# Patient Record
Sex: Male | Born: 1960 | Race: Black or African American | Hispanic: No | Marital: Single | Smoking: Current every day smoker
Health system: Southern US, Community
[De-identification: ages and names within clinical notes are randomized; demographics above are authoritative.]

## PROBLEM LIST (undated history)

## (undated) DIAGNOSIS — G8929 Other chronic pain: Secondary | ICD-10-CM

## (undated) HISTORY — PX: ORTHOPEDIC SURGERY: SHX850

## (undated) HISTORY — PX: ANKLE FRACTURE SURGERY: SHX122

---

## 1997-12-13 ENCOUNTER — Emergency Department (HOSPITAL_COMMUNITY): Admission: EM | Admit: 1997-12-13 | Discharge: 1997-12-13 | Payer: Self-pay | Admitting: Emergency Medicine

## 1998-04-29 ENCOUNTER — Emergency Department (HOSPITAL_COMMUNITY): Admission: EM | Admit: 1998-04-29 | Discharge: 1998-04-29 | Payer: Self-pay | Admitting: *Deleted

## 1998-06-27 ENCOUNTER — Emergency Department (HOSPITAL_COMMUNITY): Admission: EM | Admit: 1998-06-27 | Discharge: 1998-06-27 | Payer: Self-pay | Admitting: Emergency Medicine

## 1998-12-20 ENCOUNTER — Encounter: Payer: Self-pay | Admitting: Emergency Medicine

## 1998-12-20 ENCOUNTER — Emergency Department (HOSPITAL_COMMUNITY): Admission: EM | Admit: 1998-12-20 | Discharge: 1998-12-20 | Payer: Self-pay | Admitting: Emergency Medicine

## 1999-06-19 ENCOUNTER — Emergency Department (HOSPITAL_COMMUNITY): Admission: EM | Admit: 1999-06-19 | Discharge: 1999-06-19 | Payer: Self-pay | Admitting: Emergency Medicine

## 1999-09-02 ENCOUNTER — Encounter: Payer: Self-pay | Admitting: Emergency Medicine

## 1999-09-02 ENCOUNTER — Emergency Department (HOSPITAL_COMMUNITY): Admission: EM | Admit: 1999-09-02 | Discharge: 1999-09-02 | Payer: Self-pay | Admitting: Emergency Medicine

## 1999-09-14 ENCOUNTER — Emergency Department (HOSPITAL_COMMUNITY): Admission: EM | Admit: 1999-09-14 | Discharge: 1999-09-14 | Payer: Self-pay | Admitting: Emergency Medicine

## 2000-09-05 ENCOUNTER — Emergency Department (HOSPITAL_COMMUNITY): Admission: EM | Admit: 2000-09-05 | Discharge: 2000-09-05 | Payer: Self-pay | Admitting: Emergency Medicine

## 2000-09-05 ENCOUNTER — Encounter: Payer: Self-pay | Admitting: Emergency Medicine

## 2000-10-05 ENCOUNTER — Encounter: Payer: Self-pay | Admitting: Emergency Medicine

## 2000-10-05 ENCOUNTER — Inpatient Hospital Stay (HOSPITAL_COMMUNITY): Admission: EM | Admit: 2000-10-05 | Discharge: 2000-10-13 | Payer: Self-pay | Admitting: *Deleted

## 2000-10-06 ENCOUNTER — Encounter: Payer: Self-pay | Admitting: Hematology & Oncology

## 2000-10-09 ENCOUNTER — Encounter: Payer: Self-pay | Admitting: Family Medicine

## 2000-10-18 ENCOUNTER — Emergency Department (HOSPITAL_COMMUNITY): Admission: EM | Admit: 2000-10-18 | Discharge: 2000-10-18 | Payer: Self-pay | Admitting: Emergency Medicine

## 2001-02-06 ENCOUNTER — Emergency Department (HOSPITAL_COMMUNITY): Admission: EM | Admit: 2001-02-06 | Discharge: 2001-02-06 | Payer: Self-pay | Admitting: *Deleted

## 2001-04-10 ENCOUNTER — Emergency Department (HOSPITAL_COMMUNITY): Admission: EM | Admit: 2001-04-10 | Discharge: 2001-04-10 | Payer: Self-pay | Admitting: *Deleted

## 2001-05-01 ENCOUNTER — Encounter: Payer: Self-pay | Admitting: Emergency Medicine

## 2001-05-01 ENCOUNTER — Emergency Department (HOSPITAL_COMMUNITY): Admission: EM | Admit: 2001-05-01 | Discharge: 2001-05-01 | Payer: Self-pay

## 2001-10-22 ENCOUNTER — Emergency Department (HOSPITAL_COMMUNITY): Admission: EM | Admit: 2001-10-22 | Discharge: 2001-10-22 | Payer: Self-pay | Admitting: Emergency Medicine

## 2003-05-24 ENCOUNTER — Emergency Department (HOSPITAL_COMMUNITY): Admission: EM | Admit: 2003-05-24 | Discharge: 2003-05-24 | Payer: Self-pay | Admitting: Emergency Medicine

## 2003-06-06 ENCOUNTER — Emergency Department (HOSPITAL_COMMUNITY): Admission: AD | Admit: 2003-06-06 | Discharge: 2003-06-06 | Payer: Self-pay | Admitting: Family Medicine

## 2003-09-12 ENCOUNTER — Emergency Department (HOSPITAL_COMMUNITY): Admission: EM | Admit: 2003-09-12 | Discharge: 2003-09-12 | Payer: Self-pay | Admitting: Emergency Medicine

## 2005-01-08 ENCOUNTER — Emergency Department (HOSPITAL_COMMUNITY): Admission: EM | Admit: 2005-01-08 | Discharge: 2005-01-08 | Payer: Self-pay | Admitting: Family Medicine

## 2005-01-16 ENCOUNTER — Emergency Department (HOSPITAL_COMMUNITY): Admission: EM | Admit: 2005-01-16 | Discharge: 2005-01-16 | Payer: Self-pay | Admitting: Family Medicine

## 2005-03-23 ENCOUNTER — Emergency Department (HOSPITAL_COMMUNITY): Admission: EM | Admit: 2005-03-23 | Discharge: 2005-03-23 | Payer: Self-pay | Admitting: Family Medicine

## 2005-03-29 ENCOUNTER — Emergency Department (HOSPITAL_COMMUNITY): Admission: EM | Admit: 2005-03-29 | Discharge: 2005-03-29 | Payer: Self-pay | Admitting: Emergency Medicine

## 2005-12-04 ENCOUNTER — Emergency Department (HOSPITAL_COMMUNITY): Admission: EM | Admit: 2005-12-04 | Discharge: 2005-12-04 | Payer: Self-pay | Admitting: Family Medicine

## 2005-12-07 ENCOUNTER — Emergency Department (HOSPITAL_COMMUNITY): Admission: EM | Admit: 2005-12-07 | Discharge: 2005-12-08 | Payer: Self-pay | Admitting: Emergency Medicine

## 2006-02-11 ENCOUNTER — Emergency Department (HOSPITAL_COMMUNITY): Admission: EM | Admit: 2006-02-11 | Discharge: 2006-02-11 | Payer: Self-pay | Admitting: Emergency Medicine

## 2006-08-08 ENCOUNTER — Emergency Department (HOSPITAL_COMMUNITY): Admission: EM | Admit: 2006-08-08 | Discharge: 2006-08-08 | Payer: Self-pay | Admitting: Family Medicine

## 2006-08-22 ENCOUNTER — Emergency Department (HOSPITAL_COMMUNITY): Admission: EM | Admit: 2006-08-22 | Discharge: 2006-08-22 | Payer: Self-pay | Admitting: Family Medicine

## 2007-02-18 ENCOUNTER — Emergency Department (HOSPITAL_COMMUNITY): Admission: EM | Admit: 2007-02-18 | Discharge: 2007-02-18 | Payer: Self-pay | Admitting: Emergency Medicine

## 2007-03-03 ENCOUNTER — Emergency Department (HOSPITAL_COMMUNITY): Admission: EM | Admit: 2007-03-03 | Discharge: 2007-03-03 | Payer: Self-pay | Admitting: Emergency Medicine

## 2007-05-26 ENCOUNTER — Emergency Department (HOSPITAL_COMMUNITY): Admission: EM | Admit: 2007-05-26 | Discharge: 2007-05-26 | Payer: Self-pay | Admitting: Emergency Medicine

## 2007-07-20 ENCOUNTER — Emergency Department (HOSPITAL_COMMUNITY): Admission: EM | Admit: 2007-07-20 | Discharge: 2007-07-20 | Payer: Self-pay | Admitting: Emergency Medicine

## 2007-09-01 ENCOUNTER — Emergency Department (HOSPITAL_COMMUNITY): Admission: EM | Admit: 2007-09-01 | Discharge: 2007-09-01 | Payer: Self-pay | Admitting: Emergency Medicine

## 2007-11-23 ENCOUNTER — Emergency Department (HOSPITAL_COMMUNITY): Admission: EM | Admit: 2007-11-23 | Discharge: 2007-11-23 | Payer: Self-pay | Admitting: Emergency Medicine

## 2008-01-03 ENCOUNTER — Emergency Department (HOSPITAL_COMMUNITY): Admission: EM | Admit: 2008-01-03 | Discharge: 2008-01-03 | Payer: Self-pay | Admitting: Emergency Medicine

## 2008-03-24 ENCOUNTER — Emergency Department (HOSPITAL_COMMUNITY): Admission: EM | Admit: 2008-03-24 | Discharge: 2008-03-24 | Payer: Self-pay | Admitting: Emergency Medicine

## 2008-04-15 ENCOUNTER — Emergency Department (HOSPITAL_COMMUNITY): Admission: EM | Admit: 2008-04-15 | Discharge: 2008-04-15 | Payer: Self-pay | Admitting: Emergency Medicine

## 2008-04-20 ENCOUNTER — Emergency Department (HOSPITAL_COMMUNITY): Admission: EM | Admit: 2008-04-20 | Discharge: 2008-04-20 | Payer: Self-pay | Admitting: Emergency Medicine

## 2008-09-28 ENCOUNTER — Emergency Department (HOSPITAL_COMMUNITY): Admission: EM | Admit: 2008-09-28 | Discharge: 2008-09-28 | Payer: Self-pay | Admitting: Emergency Medicine

## 2009-05-28 ENCOUNTER — Emergency Department (HOSPITAL_COMMUNITY): Admission: EM | Admit: 2009-05-28 | Discharge: 2009-05-28 | Payer: Self-pay | Admitting: Emergency Medicine

## 2009-07-06 ENCOUNTER — Encounter: Admission: RE | Admit: 2009-07-06 | Discharge: 2009-07-06 | Payer: Self-pay | Admitting: Internal Medicine

## 2010-09-20 LAB — DIFFERENTIAL
Basophils Absolute: 0.1 10*3/uL (ref 0.0–0.1)
Basophils Relative: 2 % — ABNORMAL HIGH (ref 0–1)
Eosinophils Absolute: 0.1 10*3/uL (ref 0.0–0.7)
Eosinophils Relative: 3 % (ref 0–5)
Lymphocytes Relative: 40 % (ref 12–46)
Lymphs Abs: 1.7 10*3/uL (ref 0.7–4.0)
Monocytes Absolute: 0.4 10*3/uL (ref 0.1–1.0)
Monocytes Relative: 9 % (ref 3–12)
Neutro Abs: 1.9 10*3/uL (ref 1.7–7.7)
Neutrophils Relative %: 46 % (ref 43–77)

## 2010-09-20 LAB — CBC
HCT: 42.2 % (ref 39.0–52.0)
Hemoglobin: 14.6 g/dL (ref 13.0–17.0)
MCHC: 34.5 g/dL (ref 30.0–36.0)
MCV: 91.4 fL (ref 78.0–100.0)
Platelets: 197 10*3/uL (ref 150–400)
RBC: 4.61 MIL/uL (ref 4.22–5.81)
RDW: 12.9 % (ref 11.5–15.5)
WBC: 4.2 10*3/uL (ref 4.0–10.5)

## 2010-09-20 LAB — COMPREHENSIVE METABOLIC PANEL
ALT: 12 U/L (ref 0–53)
AST: 11 U/L (ref 0–37)
Albumin: 3.6 g/dL (ref 3.5–5.2)
Alkaline Phosphatase: 53 U/L (ref 39–117)
BUN: 11 mg/dL (ref 6–23)
CO2: 23 mEq/L (ref 19–32)
Calcium: 9 mg/dL (ref 8.4–10.5)
Chloride: 106 mEq/L (ref 96–112)
Creatinine, Ser: 1.09 mg/dL (ref 0.4–1.5)
GFR calc Af Amer: >60 mL/min (ref >60)
GFR calc non Af Amer: >60 mL/min (ref >60)
Glucose, Bld: 84 mg/dL (ref 70–99)
Potassium: 4.1 mEq/L (ref 3.5–5.1)
Sodium: 135 mEq/L (ref 135–145)
Total Bilirubin: 1 mg/dL (ref 0.3–1.2)
Total Protein: 6.8 g/dL (ref 6.0–8.3)

## 2010-09-20 LAB — URINALYSIS, ROUTINE W REFLEX MICROSCOPIC
Bilirubin Urine: NEGATIVE
Glucose, UA: NEGATIVE mg/dL
Hgb urine dipstick: NEGATIVE
Ketones, ur: NEGATIVE mg/dL
Nitrite: NEGATIVE
Protein, ur: NEGATIVE mg/dL
Specific Gravity, Urine: 1.019 (ref 1.005–1.030)
Urobilinogen, UA: 1 mg/dL (ref 0.0–1.0)
pH: 7 (ref 5.0–8.0)

## 2010-09-20 LAB — LIPASE, BLOOD: Lipase: 27 U/L (ref 11–59)

## 2010-09-28 LAB — CBC
HCT: 40.5 % (ref 39.0–52.0)
Hemoglobin: 14.3 g/dL (ref 13.0–17.0)
MCHC: 35.3 g/dL (ref 30.0–36.0)
MCV: 89.2 fL (ref 78.0–100.0)
Platelets: 165 10*3/uL (ref 150–400)
RBC: 4.54 MIL/uL (ref 4.22–5.81)
RDW: 13.8 % (ref 11.5–15.5)
WBC: 3.5 10*3/uL — ABNORMAL LOW (ref 4.0–10.5)

## 2010-09-28 LAB — COMPREHENSIVE METABOLIC PANEL
ALT: 14 U/L (ref 0–53)
AST: 18 U/L (ref 0–37)
Albumin: 3.6 g/dL (ref 3.5–5.2)
Alkaline Phosphatase: 55 U/L (ref 39–117)
BUN: 10 mg/dL (ref 6–23)
CO2: 26 mEq/L (ref 19–32)
Calcium: 9.1 mg/dL (ref 8.4–10.5)
Chloride: 107 mEq/L (ref 96–112)
Creatinine, Ser: 1.07 mg/dL (ref 0.4–1.5)
GFR calc Af Amer: >60 mL/min (ref >60)
GFR calc non Af Amer: >60 mL/min (ref >60)
Glucose, Bld: 106 mg/dL — ABNORMAL HIGH (ref 70–99)
Potassium: 3.5 mEq/L (ref 3.5–5.1)
Sodium: 139 mEq/L (ref 135–145)
Total Bilirubin: 0.8 mg/dL (ref 0.3–1.2)
Total Protein: 6.2 g/dL (ref 6.0–8.3)

## 2010-09-28 LAB — URINALYSIS, ROUTINE W REFLEX MICROSCOPIC
Bilirubin Urine: NEGATIVE
Glucose, UA: NEGATIVE mg/dL
Hgb urine dipstick: NEGATIVE
Ketones, ur: NEGATIVE mg/dL
Nitrite: NEGATIVE
Protein, ur: NEGATIVE mg/dL
Specific Gravity, Urine: 1.017 (ref 1.005–1.030)
Urobilinogen, UA: 1 mg/dL (ref 0.0–1.0)
pH: 6.5 (ref 5.0–8.0)

## 2010-09-28 LAB — DIFFERENTIAL
Basophils Absolute: 0 10*3/uL (ref 0.0–0.1)
Basophils Relative: 1 % (ref 0–1)
Eosinophils Absolute: 0.1 10*3/uL (ref 0.0–0.7)
Eosinophils Relative: 3 % (ref 0–5)
Lymphocytes Relative: 41 % (ref 12–46)
Lymphs Abs: 1.4 10*3/uL (ref 0.7–4.0)
Monocytes Absolute: 0.4 10*3/uL (ref 0.1–1.0)
Monocytes Relative: 12 % (ref 3–12)
Neutro Abs: 1.5 10*3/uL — ABNORMAL LOW (ref 1.7–7.7)
Neutrophils Relative %: 44 % (ref 43–77)

## 2010-09-28 LAB — LIPASE, BLOOD: Lipase: 31 U/L (ref 11–59)

## 2010-11-04 NOTE — H&P (Signed)
. [facility]  Patient:    Steve Anderson, Steve Anderson                      MRN: [id number] Adm. 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  CHIEF COMPLAINT: "Shortness of breath and chest pain," which started yesterday.  HISTORY OF PRESENT ILLNESS: This is a 50 year old black male, with no chronic medical problems, who developed acute onset of dyspnea and sharp right-sided pleuritic chest pain about 6 p.m. on October 04, 2000 at rest.  His symptoms, especially the pleuritic pain, progressed today and he presented here for further evaluation.  He denies any recent cough, fever, chills, or any night sweats.  He had a CT scan here which showed bilateral lower lobe pulmonary emboli with small right lower lobe peripheral infarcts, but no obvious DVT. He has no prior history of DVT or PE.  There has been no recent acute injury. He smokes less than half a pack of cigarettes per day and denies any illicit drug use.  PAST MEDICAL HISTORY: Denies any chronic medical problems.  PAST SURGICAL HISTORY: In 1994 he had a compound fracture of the left lower extremity requiring surgery secondary to a fall.  He denies any other surgeries.  CURRENT MEDICATIONS: No regular medications.  ALLERGIES: No known drug allergies.  SOCIAL HISTORY: He is single.  He lives with a girlfriend and has three sons that live with him, two eight-year-old twins, and a ten-year-old son.  He works as a Education administrator.  He drinks about one to two beers per day and denies ever drinking more than two beers per day.  Smokes less than half a pack of cigarettes per day.  Denies any IV or other drug use.  FAMILY HISTORY: Mother has hypertension.  Father died at the age of 39 of lung cancer and was a smoker.  He  has five brothers and six sisters with no known health problems in them.  No coronary disease, diabetes, or stroke.  There is no known family history of any clotting disorder.  REVIEW OF SYSTEMS: Appetite has been good.  His weight has been stable.  He denies any recent problems with fever, chills, abdominal pain, lower extremity pain, stool changes, headaches, or syncope.  PHYSICAL EXAMINATION:  GENERAL: He is an alert and healthy-appearing gentleman, sitting up in bed, with oxygen by nasal cannula, in no apparent distress.  VITAL SIGNS: He is afebrile.  Blood pressure 117/69, pulse 100 and regular. Oxygen saturation 94% on room air, 97% on two liters nasal cannula.  HEENT: PERRL.  TMs normal.  Oropharynx moist.  No lesions.  NECK: Supple, without nodes.  No JVD.  CHEST: Faint rales at bases.  No retractions.  HEART: Regular rhythm, rate about 100; no murmur or rub.  ABDOMEN:  Soft, nontender, without hepatosplenomegaly.  RECTAL: Examination refused by patient.  EXTREMITIES: No lower extremity edema noted, 2+ dorsalis pedis pulses bilaterally.  No calf tenderness.  He has an ankle brace on the left ankle. There is no evidence of any ecchymosis or other signs of acute lower extremity trauma.  LABORATORY DATA: CT of the chest reveals bilateral lower lobe emboli with right lower lobe  peripheral infarcts, no DVT.  IMPRESSION: This is a 50 year old gentleman with acute bilateral pulmonary emboli, with no evidence for lower extremity deep vein thrombosis.  This is initial episode of pulmonary embolus or any other hypercoagulable problem.  He has no clear risk factors except for smoking.  There is no known family history of hypercoagulable state.  He is currently not in any respiratory distress but has moderate pain, which has been relieved in the emergency room with morphine and Dilaudid.  PLAN: Will admit and continue IV heparin, which has already been initiated, and start  Coumadin concomitantly.  Supportive treatment with oxygen and morphine sulfate as-needed for pain control.  He will be admitted to a monitored bed for close observation. DD:  10/05/00 TD:  10/08/00 Job: [id number]

## 2010-11-04 NOTE — Consult Note (Signed)
. [facility]  Patient:    Steve Anderson, Steve Anderson                      MRN: [id number] Proc. Date: 10/06/00 Adm. Date:  [id number] Attending:  Trinna Post Dictator:   Rose Phi. Myna Hidalgo, M.D.                          Consultation Report  REASON FOR CONSULTATION:  Bilateral pulmonary emboli -- idiopathic.  HISTORY OF PRESENT ILLNESS:  Steve Anderson is a very nice 50 year old white gentleman who is identified by his past medical history.  He has been very active.  He is a Education administrator.  He has been doing real well.  He has not been on any kind of medications prior to this hospitalization.  He came in with increased chest pain.  This was mostly in the right lower, lateral rib cage.  This was mostly with inspiration.  This had begun to get worse throughout the day.  He was experiencing some slight cough and some shortness of breath.  There was no hemoptysis.  He had a little bit of fever. He had no sweats.  There had been no prior weight loss.  His appetite had been good.  There is a change in bowel/bladder habits.  He did not note any unusual swelling of his legs.  He does have chronic nonpitting edema of the left leg, secondary to a compound fracture sustained several years ago.  FINDINGS UPON ARRIVAL TO EMERGENCY ROOM:  He had blood work done.  A blood gas was showing a pH 7.44, pCO2 30, pO2 supposedly not recorded.  His electrolytes were pretty much unrevealing.  His CBC when it came showed a white cell count of 8.9, hemoglobin 14.0, hematocrit 46, and platelet count 312.  He underwent a CT scan of his chest.  This was for pulmonary emboli possibility.  Surprisingly enough, CT scan of the chest did reveal bilateral pulmonary emboli.  These were in the lower lobes bilaterally.  There were a few peripheral right lower lobe infarcts.  There is no thrombosis in the heart or main pulmonary artery trunk.  He had no evidence of deep venous thrombosis.  He  subsequently was admitted to start anticoagulation.  COMMENTS:  He has been very healthy.  He has never had any difficulties in the past.  He had his leg operation several years ago, and came through without difficulties.  He denies any prior cough, shortness of breath or chest pain.  He has been gaining some weight.  He has not noted any obvious bleeding.  There is no bruising.  He has had no ______ .  He is not taking any kind of supplements.  He does exercises, but does not take any protein or other body building enhancement drugs.  PAST MEDICAL HISTORY:  Essentially unrevealing.  ALLERGIES:  None.  CURRENT MEDICATIONS:  Tylenol as needed.  SOCIAL HISTORY:  He smokes about five cigarettes a day; he has done this for the past 10 out of 12 years.  He has rare alcohol use.  FAMILY HISTORY:  Remarkable for lung cancer in his father, who died at age 53. His mother is still alive and she is coping with diabetes.  REVIEW OF SYSTEMS:  As in the history of present illness.  PHYSICAL EXAMINATION:  GENERAL:  He is an alert, well-developed, well-nourished white gentleman; no obvious distress.  VITAL SIGNS:  Temperature 99.3, pulse 77, respiratory rate 18, blood pressure 131/64.  HEAD/NECK:  Shows normocephalic, atraumatic skull.  There is no scleral icterus.  Extraocular muscles are intact.  He has very poor dentition.  There is no adenopathy of the neck.  Thyroid is not palpable.  LUNGS:  With some splinting in the right lower lung.  It is difficult for Steve Anderson to take a deep breath in.  I do not detect any ______ breath sounds, except down at the left base.  CARDIAC:  Shows regular rate and rhythm, with normal S1 and S2.  No murmurs, rubs or bruits.  ABDOMEN:  Soft abdomen with good bowel sounds.  There is no palpable hepatosplenomegaly.  LYMPHATICS:  Inguinal lateral and obtuse showed no inguinal lateral lymph nodes.  EXTREMITIES:  Shows chronic nonpitting edema of  the left leg.  This was about 2+.  There is good range of motion of his joints.  There is no abnormalities of upper extremities.  Pulses are intact.  SKIN:  Does not reveal any evidence of any abnormalities.  NEUROLOGIC:  Showed no focal or neurological deficits.  IMPRESSION:  Steve Anderson is a 50 year old gentleman with multiple pulmonary emboli.  These are idiopathic.  There is no obvious source for these.  By my exam, we cannot find an obvious source for the emboli.  Was considering a cardiac echocardiogram on him, but I really did not appreciate any cardiac murmur.  I would deem that Steve Anderson would be lower limit to have underlying malignancy.  I did do a testicular examination on him, and his testicles were normal.  I will go ahead and make sure his stools are guaiac, so that I would know that we did not overlook a lower GI source.  I will also do a routine hypercoagulable study.  These should hopefully lead [place] in the right direction.  Steve Anderson certainly warrants one unit of anticoagulation.  Whether or not he needs to stay on this longer would depend on the results of our workup.  For now, I think we need to await his studies coming back.  Hopefully, we will find a cause for this, as _______________________ longer on Coumadin. DD:  10/06/00 TD:  10/06/00 Job: [id number]

## 2010-11-04 NOTE — Discharge Summary (Signed)
[place]  Patient:    Steve Anderson, Steve Anderson                      MRN: [id number] Adm. Date:  [id number] Disc. Date: [id number] Attending:  Trinna Post CC:         [facility]   Discharge Summary  ADMISSION DIAGNOSIS:  Bilateral pulmonary emboli.  DISCHARGE DIAGNOSIS:  Bilateral pulmonary emboli with no source sound.  PROCEDURES:  October 05, 2000, chest x-ray showing moderate right basilar infiltrates, mild left basilar infiltrates, and small right effusion.  CT of the abdomen and pelvis showing no evidence of inflammatory process, no adenopathy, positive for bibasilar pulmonary infiltrates.  Negative pelvis CT. Chest CT showing pulmonary emboli.  A 2D echo with overall normal left ventricular systolic function, ejection fraction estimated 55 to 65%.  No thrombus identified.  EKG showing normal sinus rhythm.  Repeat showing sinus tachycardia.  HOSPITAL COURSE:  The patient was initially admitted with severe right pleuritic chest pain.  Chest x-ray showed infiltrate.  CT scan showed bilateral lower lobe pulmonary emboli with small right lower lobe peripheral infarcts but no obvious source of DVT on further evaluation. The patient does smoke, has no other medical problems, and no prior injury.  His pulse oximeter was 94% on room air, 97% on two liters O2 by nasal cannula.  He was started on heparin as well as Coumadin.  It took quite a while for his INR to reach 2 and at that point he was feeling much better with minimal pain in the right side and was discharged with a goal INR of 3 to 3.5.  Rose Phi. Myna Hidalgo, M.D., did consult on him with these recommendations.  His coag factor VIII activity was slightly elevated at 252 and his lupus anticoagulant panel, PTT/D was slightly elevated at 57.  It was thought by Dr. Myna Hidalgo that there was no obvious clotting abnormality to cause the PEs.  LABS:  Admission sodium 133, potassium 4.1, chloride 105,  bicarb 20, glucose 92, BUN 12, creatinine 0.9, calcium 9.8, total protein 7.6, albumin 4.0, AST 18, ALT 12, alkaline phosphatase 55, total bilirubin 1.0.  Protime G 20210A negative.  Anticardiolipin antibody IGM negative at less than 11, anticardiolipin IGG negative at less than 12, and anticardiolipin IGA negative at less than 14.  Anticardiolipin IGM and IGG also negative.  Homocystine 6.56.  SPEP consistent with inflammation.  Protein C 74. Lupus anticoagulant within normal levels with the exception of mildly elevated PTT/D and _______ factor VIII activity 252.  Factor V Leiden mutation negative.  INR on October 13, 2000, 2.0.  On admission pH 7.44, pCO2 30. October 12, 2000, wbc 4.6, hemoglobin 12.9, hematocrit 37.4, platelets 282.  DISCHARGE MEDICATION:  Coumadin 5 mg two p.o. q.d. and in two days he is to follow up at [facility] for blood work and exam with close follow-up.  I did stress the importance of this with him.  He is also to not smoke, not to eat greens while on the Coumadin.  It is also recommended that he stay on the Coumadin for a good year.  No aspirin or other nonsteroidals. He received several days worth of Coumadin through the hospital pharmacy until he can follow up at [facility].  He is getting a medicaid card in two days.  CONDITION ON DISCHARGE:  Improved. DD:  10/13/00 TD:  10/15/00 Job: [id number]

## 2011-01-24 ENCOUNTER — Emergency Department (HOSPITAL_COMMUNITY)
Admission: EM | Admit: 2011-01-24 | Discharge: 2011-01-24 | Disposition: A | Discharge status: Home / Self Care | Payer: Self-pay | Attending: Emergency Medicine | Admitting: Emergency Medicine

## 2011-01-24 DIAGNOSIS — H9209 Otalgia, unspecified ear: Secondary | ICD-10-CM | POA: Insufficient documentation

## 2011-01-24 DIAGNOSIS — H60399 Other infective otitis externa, unspecified ear: Secondary | ICD-10-CM | POA: Insufficient documentation

## 2011-03-13 LAB — POCT URINALYSIS DIP (DEVICE)
Glucose, UA: NEGATIVE
Hgb urine dipstick: NEGATIVE
Nitrite: NEGATIVE
Operator id: 235561
Protein, ur: NEGATIVE
Specific Gravity, Urine: 1.015
Urobilinogen, UA: 4 — ABNORMAL HIGH
pH: 5.5

## 2011-03-27 LAB — CBC
HCT: 41.3
Hemoglobin: 14.2
MCHC: 34.5
MCV: 88.6
Platelets: 188
RBC: 4.67
RDW: 13.3
WBC: 5.4

## 2011-03-27 LAB — I-STAT 8, (EC8 V) (CONVERTED LAB)
Acid-base deficit: 2
BUN: 13
Bicarbonate: 23.4
Chloride: 107
Glucose, Bld: 108 — ABNORMAL HIGH
HCT: 45
Hemoglobin: 15.3
Operator id: 284251
Potassium: 4.1
Sodium: 139
TCO2: 25
pCO2, Ven: 43.1 — ABNORMAL LOW
pH, Ven: 7.343 — ABNORMAL HIGH

## 2011-03-27 LAB — POCT I-STAT CREATININE
Creatinine, Ser: 1.2
Operator id: 284251

## 2011-03-27 LAB — DIFFERENTIAL
Basophils Absolute: 0
Basophils Relative: 1
Eosinophils Absolute: 0.1 — ABNORMAL LOW
Eosinophils Relative: 2
Lymphocytes Relative: 27
Lymphs Abs: 1.5
Monocytes Absolute: 0.5
Monocytes Relative: 10
Neutro Abs: 3.3
Neutrophils Relative %: 61

## 2011-03-31 LAB — COMPREHENSIVE METABOLIC PANEL
ALT: 13
AST: 18
Albumin: 3.6
Alkaline Phosphatase: 52
BUN: 12
CO2: 25
Calcium: 8.8
Chloride: 106
Creatinine, Ser: 1.17
GFR calc Af Amer: >60
GFR calc non Af Amer: >60
Glucose, Bld: 81
Potassium: 4
Sodium: 138
Total Bilirubin: 0.4
Total Protein: 6.6

## 2011-03-31 LAB — POCT URINALYSIS DIP (DEVICE)
Bilirubin Urine: NEGATIVE
Glucose, UA: NEGATIVE
Hgb urine dipstick: NEGATIVE
Ketones, ur: NEGATIVE
Nitrite: NEGATIVE
Operator id: 116391
Protein, ur: NEGATIVE
Specific Gravity, Urine: 1.025
Urobilinogen, UA: 0.2
pH: 5.5

## 2011-03-31 LAB — DIFFERENTIAL
Basophils Absolute: 0
Basophils Relative: 1
Eosinophils Absolute: 0.1
Eosinophils Relative: 3
Lymphocytes Relative: 55 — ABNORMAL HIGH
Lymphs Abs: 2.2
Monocytes Absolute: 0.4
Monocytes Relative: 11
Neutro Abs: 1.2 — ABNORMAL LOW
Neutrophils Relative %: 30 — ABNORMAL LOW

## 2011-03-31 LAB — URINALYSIS, ROUTINE W REFLEX MICROSCOPIC
Bilirubin Urine: NEGATIVE
Glucose, UA: NEGATIVE
Ketones, ur: NEGATIVE
Leukocytes, UA: NEGATIVE
Nitrite: NEGATIVE
Protein, ur: NEGATIVE
Specific Gravity, Urine: 1.015
Urobilinogen, UA: 1
pH: 6

## 2011-03-31 LAB — URINE CULTURE
Colony Count: NO GROWTH
Culture: NO GROWTH

## 2011-03-31 LAB — CBC
HCT: 40.3
Hemoglobin: 13.9
MCHC: 34.5
MCV: 88.9
Platelets: 169
RBC: 4.53
RDW: 13
WBC: 4.1

## 2011-03-31 LAB — LIPASE, BLOOD: Lipase: 31

## 2011-03-31 LAB — URINE MICROSCOPIC-ADD ON

## 2011-05-21 ENCOUNTER — Emergency Department (HOSPITAL_COMMUNITY)
Admission: EM | Admit: 2011-05-21 | Discharge: 2011-05-21 | Disposition: A | Discharge status: Home / Self Care | Payer: Self-pay | Attending: Emergency Medicine | Admitting: Emergency Medicine

## 2011-05-21 ENCOUNTER — Emergency Department (HOSPITAL_COMMUNITY): Payer: Self-pay

## 2011-05-21 ENCOUNTER — Encounter: Payer: Self-pay | Admitting: Emergency Medicine

## 2011-05-21 DIAGNOSIS — R109 Unspecified abdominal pain: Secondary | ICD-10-CM | POA: Insufficient documentation

## 2011-05-21 DIAGNOSIS — Z86718 Personal history of other venous thrombosis and embolism: Secondary | ICD-10-CM | POA: Insufficient documentation

## 2011-05-21 LAB — URINALYSIS, ROUTINE W REFLEX MICROSCOPIC
Glucose, UA: NEGATIVE mg/dL
Hgb urine dipstick: NEGATIVE
Ketones, ur: NEGATIVE mg/dL
Leukocytes, UA: NEGATIVE
Nitrite: NEGATIVE
Protein, ur: NEGATIVE mg/dL
Specific Gravity, Urine: 1.023 (ref 1.005–1.030)
Urobilinogen, UA: 1 mg/dL (ref 0.0–1.0)
pH: 5.5 (ref 5.0–8.0)

## 2011-05-21 LAB — DIFFERENTIAL
Basophils Absolute: 0 10*3/uL (ref 0.0–0.1)
Basophils Relative: 1 % (ref 0–1)
Eosinophils Absolute: 0.1 10*3/uL (ref 0.0–0.7)
Eosinophils Relative: 2 % (ref 0–5)
Lymphocytes Relative: 50 % — ABNORMAL HIGH (ref 12–46)
Lymphs Abs: 1.7 10*3/uL (ref 0.7–4.0)
Monocytes Absolute: 0.4 10*3/uL (ref 0.1–1.0)
Monocytes Relative: 11 % (ref 3–12)
Neutro Abs: 1.2 10*3/uL — ABNORMAL LOW (ref 1.7–7.7)
Neutrophils Relative %: 37 % — ABNORMAL LOW (ref 43–77)

## 2011-05-21 LAB — BASIC METABOLIC PANEL
BUN: 10 mg/dL (ref 6–23)
CO2: 22 mEq/L (ref 19–32)
Calcium: 8.8 mg/dL (ref 8.4–10.5)
Chloride: 106 mEq/L (ref 96–112)
Creatinine, Ser: 0.86 mg/dL (ref 0.50–1.35)
GFR calc Af Amer: >90 mL/min (ref >90)
GFR calc non Af Amer: >90 mL/min (ref >90)
Glucose, Bld: 86 mg/dL (ref 70–99)
Potassium: 3.7 mEq/L (ref 3.5–5.1)
Sodium: 137 mEq/L (ref 135–145)

## 2011-05-21 LAB — CBC
HCT: 42 % (ref 39.0–52.0)
Hemoglobin: 14.9 g/dL (ref 13.0–17.0)
MCH: 31.2 pg (ref 26.0–34.0)
MCHC: 35.5 g/dL (ref 30.0–36.0)
MCV: 87.9 fL (ref 78.0–100.0)
Platelets: 151 10*3/uL (ref 150–400)
RBC: 4.78 MIL/uL (ref 4.22–5.81)
RDW: 13.4 % (ref 11.5–15.5)
WBC: 3.4 10*3/uL — ABNORMAL LOW (ref 4.0–10.5)

## 2011-05-21 LAB — LACTIC ACID, PLASMA: Lactic Acid, Venous: 0.9 mmol/L (ref 0.5–2.2)

## 2011-05-21 MED ORDER — SODIUM CHLORIDE 0.9 % IV BOLUS (SEPSIS)
1000.0000 mL | Freq: Once | INTRAVENOUS | Status: AC
  Administered 2011-05-21: 1000 mL via INTRAVENOUS

## 2011-05-21 MED ORDER — HYDROMORPHONE HCL PF 1 MG/ML IJ SOLN
1.0000 mg | Freq: Once | INTRAMUSCULAR | Status: AC
  Administered 2011-05-21: 1 mg via INTRAVENOUS
  Filled 2011-05-21: qty 1

## 2011-05-21 MED ORDER — OXYCODONE-ACETAMINOPHEN 5-325 MG PO TABS
2.0000 | ORAL_TABLET | Freq: Once | ORAL | Status: AC
  Administered 2011-05-21: 2 via ORAL
  Filled 2011-05-21: qty 2

## 2011-05-21 MED ORDER — HYDROCODONE-ACETAMINOPHEN 5-500 MG PO TABS: 1.0000 | ORAL_TABLET | Freq: Four times a day (QID) | ORAL | Status: AC | PRN

## 2011-05-21 MED ORDER — IOHEXOL 300 MG/ML  SOLN
100.0000 mL | Freq: Once | INTRAMUSCULAR | Status: AC | PRN
  Administered 2011-05-21: 100 mL via INTRAVENOUS

## 2011-05-21 MED ORDER — ONDANSETRON HCL 4 MG/2ML IJ SOLN
4.0000 mg | Freq: Once | INTRAMUSCULAR | Status: AC
  Administered 2011-05-21: 4 mg via INTRAVENOUS
  Filled 2011-05-21: qty 2

## 2011-05-21 NOTE — ED Notes (Signed)
Pt knows that urine is needed 

## 2011-05-21 NOTE — ED Notes (Signed)
Pt reports abdominal pain that is cramping for the last couple days. Denies any nausea or vomiting, but states a little diarrhea.

## 2011-05-21 NOTE — ED Provider Notes (Addendum)
History     CSN: [id number] Arrival date & time: 05/21/2011  7:45 AM   First MD Initiated Contact with Patient 05/21/11 [phone]      Chief Complaint  Patient presents with  . Abdominal Pain    (Consider location/radiation/quality/duration/timing/severity/associated sxs/prior treatment) HPI  Patient presents to emergency department complaining of a 2 day history of acute onset lower abdominal cramping. Patient states pain is in lower abdomen and radiates around to bilateral lower flanks. Patient denies any aggravating or alleviating factors. Patient states he is able to eat and drink without any change in the pain. Patient states pain is a cramping or grabbing pain that waxes and wanes but is constant. Patient denies any associated fevers, chills, nausea, vomiting, diarrhea, blood in his stool, dysuria, hematuria. Patient denies any history of similar symptoms. Patient states he has a remote history of pulmonary embolism but after hospitalization, no known source was found and was placed on temporary Coumadin use but has not taken any kind of blood thinner in many years. Patient denies any recurrent history of blood clots. Patient's history of pulmonary embolism is confirmed from review of prior admission. Patient states he's taken over-the-counter pain medicine without any relief of pain. Again denies any change in the pain with eating or drinking. Patient denies testicular pain, testicular swelling, penile pain or penile drainage.  No past medical history on file.  No past surgical history on file.  No family history on file.  History  Substance Use Topics  . Smoking status: Not on file  . Smokeless tobacco: Not on file  . Alcohol Use: Not on file      Review of Systems  All other systems reviewed and are negative.    Allergies  Review of patient's allergies indicates no known allergies.  Home Medications  No current outpatient prescriptions on file.  BP 126/80  Pulse 66   Temp(Src) 97.8 F (36.6 C) (Oral)  Resp 16  SpO2 99%  Physical Exam  Nursing note and vitals reviewed. Constitutional: He is oriented to person, place, and time. He appears well-developed and well-nourished. No distress.  HENT:  Head: Normocephalic and atraumatic.  Eyes: Conjunctivae are normal.  Neck: Normal range of motion. Neck supple.  Cardiovascular: Normal rate, regular rhythm, normal heart sounds and intact distal pulses.  Exam reveals no gallop and no friction rub.   No murmur heard. Pulmonary/Chest: Effort normal and breath sounds normal. No respiratory distress. He has no wheezes. He has no rales. He exhibits no tenderness.  Abdominal: Bowel sounds are normal. He exhibits no distension and no mass. There is tenderness. There is guarding. There is no rebound.       Abdomen soft but moderate to severe TTP of entire lower abdomen with guarding but no rebound TTP and no peritoneal signs.   Musculoskeletal: Normal range of motion. He exhibits no edema and no tenderness.  Neurological: He is alert and oriented to person, place, and time.  Skin: Skin is warm and dry. No rash noted. He is not diaphoretic. No erythema.  Psychiatric: He has a normal mood and affect.    ED Course  Procedures (including critical care time)  IV dilaudid and zofran, IV fluids  Labs Reviewed  CBC - Abnormal; Notable for the following:    WBC 3.4 (*)    All other components within normal limits  DIFFERENTIAL - Abnormal; Notable for the following:    Neutrophils Relative 37 (*)    Neutro Abs 1.2 (*)  Lymphocytes Relative 50 (*)    All other components within normal limits  URINALYSIS, ROUTINE W REFLEX MICROSCOPIC - Abnormal; Notable for the following:    APPearance CLOUDY (*)    Bilirubin Urine SMALL (*)    All other components within normal limits  BASIC METABOLIC PANEL  LACTIC ACID, PLASMA   Ct Abdomen Pelvis W Contrast  05/21/2011  *RADIOLOGY REPORT*  Clinical Data: Cramping, abdominal  pain.  Nausea, vomiting.  CT ABDOMEN AND PELVIS WITH CONTRAST  Technique:  Multidetector CT imaging of the abdomen and pelvis was performed following the standard protocol during bolus administration of intravenous contrast.  Contrast: OMNIPAQUE IOHEXOL 300 MG/ML IV SOLN  Comparison: 05/28/2009  Findings: Bibasilar atelectasis.  No effusions.  Heart is normal size.  Liver, spleen, pancreas, adrenals, kidneys, gallbladder are normal.  Appendix is visualized and is normal.  Large and small bowel are grossly unremarkable.  No free fluid, free air or adenopathy. Aorta and iliac vessels are normal caliber.  Urinary bladder is unremarkable.  Terminal ileum slightly prominent fluid-filled, but no wall thickening or surrounding inflammatory change.  Fusion across the SI joints bilaterally.  No acute bony abnormality.  Bilateral L5 pars defects noted.  IMPRESSION: No acute findings in the abdomen or pelvis.  Original Report Authenticated By: Cyndie Chime, M.D.     1. Abdominal pain       MDM  Patient is afebrile and nontoxic-appearing. There is tenderness to palpation of the abdomen however there are no peritoneal signs. He is eating and drinking without difficulty without any acute findings on his CT scan to suggest origin of pain. No elevation in lactic acids to suggest mesenteric ischemia. Patient states pain has improved. Patient denies any nausea vomiting or diarrhea. Spoke at length with patient about worrisome signs or symptoms it should warrent to return to emergency department otherwise plain of treating pain with pain medication.    Medical screening examination/treatment/procedure(s) were performed by non-physician practitioner and as supervising physician I was immediately available for consultation/collaboration. Osvaldo Human, M.D.     [place]ness [place], [place] 05/21/11 1212  Carleene Cooper III, MD 05/22/11 1530  Carleene Cooper III, MD 05/22/11 [phone]

## 2011-05-21 NOTE — ED Notes (Signed)
Pt eyes are juandice

## 2011-07-27 ENCOUNTER — Emergency Department (HOSPITAL_COMMUNITY)
Admission: EM | Admit: 2011-07-27 | Discharge: 2011-07-27 | Disposition: A | Discharge status: Home / Self Care | Payer: Self-pay | Attending: Emergency Medicine | Admitting: Emergency Medicine

## 2011-07-27 ENCOUNTER — Encounter (HOSPITAL_COMMUNITY): Payer: Self-pay | Admitting: Emergency Medicine

## 2011-07-27 ENCOUNTER — Emergency Department (HOSPITAL_COMMUNITY): Payer: Self-pay

## 2011-07-27 DIAGNOSIS — IMO0001 Reserved for inherently not codable concepts without codable children: Secondary | ICD-10-CM | POA: Insufficient documentation

## 2011-07-27 DIAGNOSIS — J069 Acute upper respiratory infection, unspecified: Secondary | ICD-10-CM | POA: Insufficient documentation

## 2011-07-27 DIAGNOSIS — F17200 Nicotine dependence, unspecified, uncomplicated: Secondary | ICD-10-CM | POA: Insufficient documentation

## 2011-07-27 DIAGNOSIS — R630 Anorexia: Secondary | ICD-10-CM | POA: Insufficient documentation

## 2011-07-27 DIAGNOSIS — R05 Cough: Secondary | ICD-10-CM | POA: Insufficient documentation

## 2011-07-27 DIAGNOSIS — J3489 Other specified disorders of nose and nasal sinuses: Secondary | ICD-10-CM | POA: Insufficient documentation

## 2011-07-27 DIAGNOSIS — R51 Headache: Secondary | ICD-10-CM | POA: Insufficient documentation

## 2011-07-27 DIAGNOSIS — R059 Cough, unspecified: Secondary | ICD-10-CM | POA: Insufficient documentation

## 2011-07-27 DIAGNOSIS — R6883 Chills (without fever): Secondary | ICD-10-CM | POA: Insufficient documentation

## 2011-07-27 MED ORDER — SODIUM CHLORIDE 0.9 % IV BOLUS (SEPSIS)
2000.0000 mL | Freq: Once | INTRAVENOUS | Status: AC
  Administered 2011-07-27: 1000 mL via INTRAVENOUS

## 2011-07-27 MED ORDER — KETOROLAC TROMETHAMINE 30 MG/ML IJ SOLN
30.0000 mg | Freq: Once | INTRAMUSCULAR | Status: AC
  Administered 2011-07-27: 30 mg via INTRAVENOUS
  Filled 2011-07-27: qty 1

## 2011-07-27 MED ORDER — GUAIFENESIN ER 1200 MG PO TB12: 1.0000 | ORAL_TABLET | Freq: Two times a day (BID) | ORAL | Status: DC

## 2011-07-27 MED ORDER — ACETAMINOPHEN-CODEINE 120-12 MG/5ML PO SOLN: 10.0000 mL | ORAL | Status: AC | PRN

## 2011-07-27 MED ORDER — HYDROCODONE-ACETAMINOPHEN 5-325 MG PO TABS
ORAL_TABLET | ORAL | Status: AC
  Administered 2011-07-27: 1
  Filled 2011-07-27: qty 1

## 2011-07-27 NOTE — ED Provider Notes (Signed)
History     CSN: [id number]  Arrival date & time 07/27/11  8119   First MD Initiated Contact with Patient 07/27/11 1002      Chief Complaint  Patient presents with  . Headache    (Consider location/radiation/quality/duration/timing/severity/associated sxs/prior treatment) HPI Patient reports to the ED with a 4 day history of drycough, nasal congestion and headache. He also complains of body aches and chills and sweats and decreased appetite.  He has used Catering manager for his symptoms and mild relief the first 2 days, and then his symptoms started getting worse. 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 He denies chest pain and shortness of breath. He also denies neck pain/stiffness, N/V/D. He is a current smoker, less than 1 ppd.  He denies other medical conditions, takes no other medications, and has no known drug allergies.  History reviewed. No pertinent past medical history.  Past Surgical History  Procedure Date  . Orthopedic surgery     No family history on file.  History  Substance Use Topics  . Smoking status: Current Everyday Smoker  . Smokeless tobacco: Not on file  . Alcohol Use: Yes      Review of Systems All pertinent positives and negatives reviewed in the history of present illness  Allergies  Review of patient's allergies indicates no known allergies.  Home Medications   Current Outpatient Rx  Name Route Sig Dispense Refill  . ACETAMINOPHEN 500 MG PO TABS Oral Take 500 mg by mouth every 4 (four) hours as needed. As needed for headache/chills.      BP 126/78  Temp(Src) 100.2 F (37.9 C) (Oral)  Resp 16  SpO2 99%  Physical Exam  Constitutional: He is oriented to person, place, and time. He appears well-developed and well-nourished. No distress.  HENT:  Head: Normocephalic and atraumatic.  Mouth/Throat: Oropharynx is clear and moist. No oropharyngeal exudate.  Eyes: Conjunctivae and EOM are normal. Pupils are equal, round, and reactive  to light.  Neck: Normal range of motion. Neck supple.  Cardiovascular: Normal rate, regular rhythm and normal heart sounds.  Exam reveals no gallop and no friction rub.   No murmur heard. Pulmonary/Chest: Effort normal and breath sounds normal. No respiratory distress. He has no wheezes. He has no rales.  Lymphadenopathy:    He has no cervical adenopathy.  Neurological: He is alert and oriented to person, place, and time.  Skin: Skin is warm and dry. He is not diaphoretic.    ED Course  Procedures (including critical care time)  Labs Reviewed - No data to display Dg Chest 2 View  07/27/2011  *RADIOLOGY REPORT*  Clinical Data: Cough with chest pain and shortness of breath.  CHEST - 2 VIEW  Comparison: 07/06/2009 radiographs.  Findings: The heart size and mediastinal contours are stable. There is stable scarring in the right lower lobe near the costophrenic angle.  The lungs are otherwise clear.  There is no pleural effusion.  No acute osseous findings are evident.  IMPRESSION: Stable right basilar scarring.  No acute cardiopulmonary process.  Original Report Authenticated By: Gerrianne Scale, M.D.     The patient will be treated for a viral URI based on his HPI and PE along with x-rays.     MDM  MDM Reviewed: vitals and nursing note Interpretation: x-ray            Carlyle Dolly, PA-C 07/27/11 1234

## 2011-07-27 NOTE — ED Provider Notes (Signed)
Medical screening examination/treatment/procedure(s) were conducted as a shared visit with non-physician practitioner(s) and myself.  I personally evaluated the patient during the encounter   Benny Lennert, MD 07/27/11 [phone]

## 2011-07-27 NOTE — ED Notes (Signed)
Pt staes having chills and h/a x 4 days  Not feeling good

## 2011-07-27 NOTE — Discharge Instructions (Signed)
Return here as needed for any worsening in your condition. Increase your fluids.

## 2011-09-28 ENCOUNTER — Encounter (HOSPITAL_COMMUNITY): Payer: Self-pay | Admitting: *Deleted

## 2011-09-28 ENCOUNTER — Emergency Department (HOSPITAL_COMMUNITY)
Admission: EM | Admit: 2011-09-28 | Discharge: 2011-09-28 | Disposition: A | Discharge status: Home / Self Care | Payer: Self-pay | Attending: Emergency Medicine | Admitting: Emergency Medicine

## 2011-09-28 DIAGNOSIS — R109 Unspecified abdominal pain: Secondary | ICD-10-CM | POA: Insufficient documentation

## 2011-09-28 DIAGNOSIS — R110 Nausea: Secondary | ICD-10-CM | POA: Insufficient documentation

## 2011-09-28 DIAGNOSIS — R10819 Abdominal tenderness, unspecified site: Secondary | ICD-10-CM | POA: Insufficient documentation

## 2011-09-28 LAB — COMPREHENSIVE METABOLIC PANEL
ALT: 9 U/L (ref 0–53)
AST: 18 U/L (ref 0–37)
Albumin: 4.2 g/dL (ref 3.5–5.2)
Alkaline Phosphatase: 49 U/L (ref 39–117)
BUN: 13 mg/dL (ref 6–23)
CO2: 24 mEq/L (ref 19–32)
Calcium: 9.4 mg/dL (ref 8.4–10.5)
Chloride: 104 mEq/L (ref 96–112)
Creatinine, Ser: 1.11 mg/dL (ref 0.50–1.35)
GFR calc Af Amer: 88 mL/min — ABNORMAL LOW (ref >90)
GFR calc non Af Amer: 76 mL/min — ABNORMAL LOW (ref >90)
Glucose, Bld: 96 mg/dL (ref 70–99)
Potassium: 3.8 mEq/L (ref 3.5–5.1)
Sodium: 139 mEq/L (ref 135–145)
Total Bilirubin: 1 mg/dL (ref 0.3–1.2)
Total Protein: 7.3 g/dL (ref 6.0–8.3)

## 2011-09-28 LAB — DIFFERENTIAL
Basophils Absolute: 0 10*3/uL (ref 0.0–0.1)
Basophils Relative: 1 % (ref 0–1)
Eosinophils Absolute: 0.1 10*3/uL (ref 0.0–0.7)
Eosinophils Relative: 1 % (ref 0–5)
Lymphocytes Relative: 52 % — ABNORMAL HIGH (ref 12–46)
Lymphs Abs: 2.5 10*3/uL (ref 0.7–4.0)
Monocytes Absolute: 0.3 10*3/uL (ref 0.1–1.0)
Monocytes Relative: 7 % (ref 3–12)
Neutro Abs: 1.9 10*3/uL (ref 1.7–7.7)
Neutrophils Relative %: 39 % — ABNORMAL LOW (ref 43–77)

## 2011-09-28 LAB — URINALYSIS, ROUTINE W REFLEX MICROSCOPIC
Glucose, UA: NEGATIVE mg/dL
Hgb urine dipstick: NEGATIVE
Ketones, ur: 15 mg/dL — AB
Leukocytes, UA: NEGATIVE
Nitrite: NEGATIVE
Protein, ur: NEGATIVE mg/dL
Specific Gravity, Urine: 1.023 (ref 1.005–1.030)
Urobilinogen, UA: 1 mg/dL (ref 0.0–1.0)
pH: 5.5 (ref 5.0–8.0)

## 2011-09-28 LAB — CBC
HCT: 42.4 % (ref 39.0–52.0)
Hemoglobin: 15.4 g/dL (ref 13.0–17.0)
MCH: 31.5 pg (ref 26.0–34.0)
MCHC: 36.3 g/dL — ABNORMAL HIGH (ref 30.0–36.0)
MCV: 86.7 fL (ref 78.0–100.0)
Platelets: 139 10*3/uL — ABNORMAL LOW (ref 150–400)
RBC: 4.89 MIL/uL (ref 4.22–5.81)
RDW: 13.6 % (ref 11.5–15.5)
WBC: 4.8 10*3/uL (ref 4.0–10.5)

## 2011-09-28 LAB — LIPASE, BLOOD: Lipase: 35 U/L (ref 11–59)

## 2011-09-28 MED ORDER — TRAMADOL HCL 50 MG PO TABS: 50.0000 mg | ORAL_TABLET | Freq: Four times a day (QID) | ORAL | Status: AC | PRN

## 2011-09-28 MED ORDER — OMEPRAZOLE 20 MG PO CPDR: 20.0000 mg | DELAYED_RELEASE_CAPSULE | Freq: Every day | ORAL | Status: DC

## 2011-09-28 MED ORDER — GI COCKTAIL ~~LOC~~
30.0000 mL | Freq: Once | ORAL | Status: AC
  Administered 2011-09-28: 30 mL via ORAL
  Filled 2011-09-28: qty 30

## 2011-09-28 MED ORDER — PANTOPRAZOLE SODIUM 40 MG PO TBEC
40.0000 mg | DELAYED_RELEASE_TABLET | Freq: Once | ORAL | Status: AC
  Administered 2011-09-28: 40 mg via ORAL
  Filled 2011-09-28: qty 1

## 2011-09-28 NOTE — ED Provider Notes (Signed)
History     CSN: [id number]  Arrival date & time 09/28/11  1756   First MD Initiated Contact with Patient 09/28/11 2024      Chief Complaint  Patient presents with  . Abdominal Pain     HPI  History provided by the patient. Patient is a 51 year old male with no significant past medical history presents with complaints of increasing lower abdominal pain for the past 2 days. Patient is a regular alcohol user daily. Patient states the pain is diffusely in abdomen with feels worse in the lower abdomen. Pain began gradually and has increased to a constant steady ache pain. Pain does not seem to be worse with eating or drinking. He does report some nausea after eating. Pain also does not seem improved or worsened with bowel movements or urination. Patient denies any dysuria, hematuria, urinary frequency, flank pain or penile discharge. Patient denies having similar symptoms previously. Patient denies any fever, chills, sweats or vomiting. Patient denies any diarrhea or constipation. Patient denies any blood in stool.    History reviewed. No pertinent past medical history.  Past Surgical History  Procedure Date  . Orthopedic surgery     History reviewed. No pertinent family history.  History  Substance Use Topics  . Smoking status: Current Everyday Smoker  . Smokeless tobacco: Not on file  . Alcohol Use: Yes      Review of Systems  Constitutional: Negative for fever, chills and appetite change.  Respiratory: Negative for shortness of breath.   Cardiovascular: Negative for chest pain.  Gastrointestinal: Positive for nausea and abdominal pain. Negative for vomiting, diarrhea, constipation and blood in stool.  Genitourinary: Negative for dysuria, frequency, hematuria, flank pain, discharge and penile pain.    Allergies  Review of patient's allergies indicates no known allergies.  Home Medications  No current outpatient prescriptions on file.  BP 123/82  Pulse 79  Temp(Src)  98.3 F (36.8 C) (Oral)  Resp 16  SpO2 97%  Physical Exam  Nursing note and vitals reviewed. Constitutional: He is oriented to person, place, and time. He appears well-developed and well-nourished. No distress.  HENT:  Head: Normocephalic and atraumatic.  Cardiovascular: Normal rate and regular rhythm.   Pulmonary/Chest: Effort normal and breath sounds normal. He has no wheezes. He has no rales.  Abdominal: Soft. He exhibits no distension. There is tenderness in the left upper quadrant and left lower quadrant. There is no rebound, no guarding, no CVA tenderness, no tenderness at McBurney's point and negative Murphy's sign.       Diffuse abdominal pain seems to be greatest in the left upper and lower quadrants  Neurological: He is alert and oriented to person, place, and time.  Skin: Skin is warm.  Psychiatric: He has a normal mood and affect. His behavior is normal.    ED Course  Procedures   Results for orders placed during the hospital encounter of 09/28/11  URINALYSIS, ROUTINE W REFLEX MICROSCOPIC      Component Value Range   Color, Urine AMBER (*) YELLOW    APPearance CLEAR  CLEAR    Specific Gravity, Urine 1.023  1.005 - 1.030    pH 5.5  5.0 - 8.0    Glucose, UA NEGATIVE  NEGATIVE (mg/dL)   Hgb urine dipstick NEGATIVE  NEGATIVE    Bilirubin Urine SMALL (*) NEGATIVE    Ketones, ur 15 (*) NEGATIVE (mg/dL)   Protein, ur NEGATIVE  NEGATIVE (mg/dL)   Urobilinogen, UA 1.0  0.0 - 1.0 (mg/dL)  Nitrite NEGATIVE  NEGATIVE    Leukocytes, UA NEGATIVE  NEGATIVE   CBC      Component Value Range   WBC 4.8  4.0 - 10.5 (K/uL)   RBC 4.89  4.22 - 5.81 (MIL/uL)   Hemoglobin 15.4  13.0 - 17.0 (g/dL)   HCT 16.1  09.6 - 04.5 (%)   MCV 86.7  78.0 - 100.0 (fL)   MCH 31.5  26.0 - 34.0 (pg)   MCHC 36.3 (*) 30.0 - 36.0 (g/dL)   RDW 40.9  81.1 - 91.4 (%)   Platelets 139 (*) 150 - 400 (K/uL)  DIFFERENTIAL      Component Value Range   Neutrophils Relative 39 (*) 43 - 77 (%)   Neutro Abs 1.9   1.7 - 7.7 (K/uL)   Lymphocytes Relative 52 (*) 12 - 46 (%)   Lymphs Abs 2.5  0.7 - 4.0 (K/uL)   Monocytes Relative 7  3 - 12 (%)   Monocytes Absolute 0.3  0.1 - 1.0 (K/uL)   Eosinophils Relative 1  0 - 5 (%)   Eosinophils Absolute 0.1  0.0 - 0.7 (K/uL)   Basophils Relative 1  0 - 1 (%)   Basophils Absolute 0.0  0.0 - 0.1 (K/uL)  COMPREHENSIVE METABOLIC PANEL      Component Value Range   Sodium 139  135 - 145 (mEq/L)   Potassium 3.8  3.5 - 5.1 (mEq/L)   Chloride 104  96 - 112 (mEq/L)   CO2 24  19 - 32 (mEq/L)   Glucose, Bld 96  70 - 99 (mg/dL)   BUN 13  6 - 23 (mg/dL)   Creatinine, Ser 7.82  0.50 - 1.35 (mg/dL)   Calcium 9.4  8.4 - 95.6 (mg/dL)   Total Protein 7.3  6.0 - 8.3 (g/dL)   Albumin 4.2  3.5 - 5.2 (g/dL)   AST 18  0 - 37 (U/L)   ALT 9  0 - 53 (U/L)   Alkaline Phosphatase 49  39 - 117 (U/L)   Total Bilirubin 1.0  0.3 - 1.2 (mg/dL)   GFR calc non Af Amer 76 (*) >90 (mL/min)   GFR calc Af Amer 88 (*) >90 (mL/min)  LIPASE, BLOOD      Component Value Range   Lipase 35  11 - 59 (U/L)        1. Abdominal pain       MDM  8:30 PM patient seen and evaluated. Patient in no acute distress.   Patient reports improvement after medications. Patient moving around in no apparent distress. Reexam of the abdomen has decreased tenderness. Abdomen is soft without peritoneal signs.   Labs are unremarkable. Patient continues to report feeling well. This time no exam findings or lab testing indicating need for imaging. Patient instructed for strict return precautions and advised for reexamination in 24-48 hours.     Angus Seller, [place] 09/29/11 [phone]

## 2011-09-28 NOTE — ED Notes (Signed)
Pt states that he had had abd pain for 2 days. Pt has been able to eat and drink.  Pt states that he does have nausea after eating.  No vomiting or diarrhea.

## 2011-09-28 NOTE — ED Notes (Signed)
Pt walked to the bathroom but upon returned said he wasn't able to go at this time

## 2011-09-28 NOTE — Discharge Instructions (Signed)
Your lab tests today and urine sample have not shown any signs for concerning cause to your symptoms of abdominal pains. At this time your providers feel you're able return home and followup to primary care provider. It is recommended you take an acid medications to help with possible heartburn. He avulsed been given other medications to help with pain symptoms. Return to the emergency room for a severe or worsening pains, fever, chills, persistent nausea vomiting.   Abdominal Pain Abdominal pain can be caused by many things. Your caregiver decides the seriousness of your pain by an examination and possibly blood tests and X-rays. Many cases can be observed and treated at home. Most abdominal pain is not caused by a disease and will probably improve without treatment. However, in many cases, more time must pass before a clear cause of the pain can be found. Before that point, it may not be known if you need more testing, or if hospitalization or surgery is needed. HOME CARE INSTRUCTIONS   Do not take laxatives unless directed by your caregiver.   Take pain medicine only as directed by your caregiver.   Only take over-the-counter or prescription medicines for pain, discomfort, or fever as directed by your caregiver.   Try a clear liquid diet (broth, tea, or water) for as long as directed by your caregiver. Slowly move to a bland diet as tolerated.  SEEK IMMEDIATE MEDICAL CARE IF:   The pain does not go away.   You have a fever.   You keep throwing up (vomiting).   The pain is felt only in portions of the abdomen. Pain in the right side could possibly be appendicitis. In an adult, pain in the left lower portion of the abdomen could be colitis or diverticulitis.   You pass bloody or black tarry stools.  MAKE SURE YOU:   Understand these instructions.   Will watch your condition.   Will get help right away if you are not doing well or get worse.  Document Released: 03/15/2005 Document  Revised: 05/25/2011 Document Reviewed: 01/22/2008 [facility] Patient Information 2012 [place], [place].    RESOURCE GUIDE  Dental Problems  Patients with Medicaid: [facility] [phone] [address]                                           [phone] [address] Phone:  [phone]                                                  Phone:  [phone]  If unable to pay or uninsured, contact:  [facility] or [facility]. to become qualified for the adult dental clinic.  Chronic Pain Problems Contact [facility]  [phone] Patients need to be referred by their primary care doctor.  Insufficient Money for Medicine Contact [employer]:  call "211" or [facility] [phone].  No Primary Care Doctor Call [facility]  [phone] Other agencies that provide inexpensive medical care    [facility]  [phone]    [facility]  [phone]  [facility]  [phone]    [facility]  [phone]    [facility]  [phone]    [facility]  [phone]  Psychological Services [facility]  [phone] [facility]  [phone] [facility]   [phone] (emergency services [phone])  Substance Abuse Resources [facility]  [phone] [facility] [phone] [facility] [place] [phone] Floydene Flock [phone] Residential & Outpatient Substance Abuse Program  [phone]  Abuse/Neglect [facility] Child Abuse Hotline 8648287120 [facility] Child Abuse Hotline [phone] (After Hours)  [facility] [phone]  [facility] [phone] [facility] [phone]  MRSA Hotline #:   516-560-3523    [facility] Resources  [facility]     [employer]                           [facility] Dept. [address] [place]                       [address]      [address]  Blondell Reveal Phone:  [phone]                                   Phone:  [phone]                 Phone:  [phone]  [facility] Phone:  [phone]  [facility] Child Abuse Hotline 2764952854 [phone] (After Hours)

## 2011-10-02 NOTE — ED Provider Notes (Signed)
Medical screening examination/treatment/procedure(s) were performed by non-physician practitioner and as supervising physician I was immediately available for consultation/collaboration.   Gwyneth Sprout, MD 10/02/11 2122

## 2011-11-06 ENCOUNTER — Encounter (HOSPITAL_COMMUNITY): Payer: Self-pay | Admitting: *Deleted

## 2011-11-06 ENCOUNTER — Emergency Department (HOSPITAL_COMMUNITY)
Admission: EM | Admit: 2011-11-06 | Discharge: 2011-11-06 | Disposition: A | Discharge status: Home / Self Care | Payer: Self-pay | Attending: Emergency Medicine | Admitting: Emergency Medicine

## 2011-11-06 ENCOUNTER — Emergency Department (HOSPITAL_COMMUNITY): Payer: Self-pay

## 2011-11-06 DIAGNOSIS — K760 Fatty (change of) liver, not elsewhere classified: Secondary | ICD-10-CM

## 2011-11-06 DIAGNOSIS — R110 Nausea: Secondary | ICD-10-CM | POA: Insufficient documentation

## 2011-11-06 DIAGNOSIS — F1020 Alcohol dependence, uncomplicated: Secondary | ICD-10-CM | POA: Insufficient documentation

## 2011-11-06 DIAGNOSIS — R1031 Right lower quadrant pain: Secondary | ICD-10-CM | POA: Insufficient documentation

## 2011-11-06 DIAGNOSIS — K7689 Other specified diseases of liver: Secondary | ICD-10-CM | POA: Insufficient documentation

## 2011-11-06 LAB — COMPREHENSIVE METABOLIC PANEL
ALT: 9 U/L (ref 0–53)
AST: 16 U/L (ref 0–37)
Albumin: 4 g/dL (ref 3.5–5.2)
Alkaline Phosphatase: 52 U/L (ref 39–117)
BUN: 12 mg/dL (ref 6–23)
CO2: 26 mEq/L (ref 19–32)
Calcium: 9.5 mg/dL (ref 8.4–10.5)
Chloride: 104 mEq/L (ref 96–112)
Creatinine, Ser: 1.07 mg/dL (ref 0.50–1.35)
GFR calc Af Amer: >90 mL/min (ref >90)
GFR calc non Af Amer: 79 mL/min — ABNORMAL LOW (ref >90)
Glucose, Bld: 91 mg/dL (ref 70–99)
Potassium: 3.8 mEq/L (ref 3.5–5.1)
Sodium: 140 mEq/L (ref 135–145)
Total Bilirubin: 0.5 mg/dL (ref 0.3–1.2)
Total Protein: 7.3 g/dL (ref 6.0–8.3)

## 2011-11-06 LAB — URINALYSIS, ROUTINE W REFLEX MICROSCOPIC
Glucose, UA: NEGATIVE mg/dL
Hgb urine dipstick: NEGATIVE
Ketones, ur: NEGATIVE mg/dL
Leukocytes, UA: NEGATIVE
Nitrite: NEGATIVE
Protein, ur: NEGATIVE mg/dL
Specific Gravity, Urine: 1.018 (ref 1.005–1.030)
Urobilinogen, UA: 0.2 mg/dL (ref 0.0–1.0)
pH: 5 (ref 5.0–8.0)

## 2011-11-06 LAB — DIFFERENTIAL
Basophils Absolute: 0 10*3/uL (ref 0.0–0.1)
Basophils Relative: 1 % (ref 0–1)
Eosinophils Absolute: 0 10*3/uL (ref 0.0–0.7)
Eosinophils Relative: 1 % (ref 0–5)
Lymphocytes Relative: 48 % — ABNORMAL HIGH (ref 12–46)
Lymphs Abs: 2.3 10*3/uL (ref 0.7–4.0)
Monocytes Absolute: 0.3 10*3/uL (ref 0.1–1.0)
Monocytes Relative: 7 % (ref 3–12)
Neutro Abs: 2.1 10*3/uL (ref 1.7–7.7)
Neutrophils Relative %: 44 % (ref 43–77)

## 2011-11-06 LAB — CBC
HCT: 41.5 % (ref 39.0–52.0)
Hemoglobin: 14.6 g/dL (ref 13.0–17.0)
MCH: 30.7 pg (ref 26.0–34.0)
MCHC: 35.2 g/dL (ref 30.0–36.0)
MCV: 87.4 fL (ref 78.0–100.0)
Platelets: 185 10*3/uL (ref 150–400)
RBC: 4.75 MIL/uL (ref 4.22–5.81)
RDW: 13.1 % (ref 11.5–15.5)
WBC: 4.7 10*3/uL (ref 4.0–10.5)

## 2011-11-06 LAB — LIPASE, BLOOD: Lipase: 34 U/L (ref 11–59)

## 2011-11-06 MED ORDER — IOHEXOL 300 MG/ML  SOLN
20.0000 mL | INTRAMUSCULAR | Status: AC
  Administered 2011-11-06 (×2): 20 mL via ORAL

## 2011-11-06 MED ORDER — TRAMADOL HCL 50 MG PO TABS: 50.0000 mg | ORAL_TABLET | Freq: Four times a day (QID) | ORAL | Status: AC | PRN

## 2011-11-06 MED ORDER — PROMETHAZINE HCL 25 MG PO TABS: 25.0000 mg | ORAL_TABLET | Freq: Four times a day (QID) | ORAL | Status: DC | PRN

## 2011-11-06 MED ORDER — ONDANSETRON HCL 4 MG/2ML IJ SOLN
4.0000 mg | Freq: Once | INTRAMUSCULAR | Status: AC
  Administered 2011-11-06: 4 mg via INTRAVENOUS
  Filled 2011-11-06: qty 2

## 2011-11-06 MED ORDER — MORPHINE SULFATE 4 MG/ML IJ SOLN
6.0000 mg | Freq: Once | INTRAMUSCULAR | Status: AC
  Administered 2011-11-06: 6 mg via INTRAVENOUS
  Filled 2011-11-06: qty 2

## 2011-11-06 MED ORDER — IOHEXOL 300 MG/ML  SOLN
100.0000 mL | Freq: Once | INTRAMUSCULAR | Status: AC | PRN
  Administered 2011-11-06: 100 mL via INTRAVENOUS

## 2011-11-06 MED ORDER — SODIUM CHLORIDE 0.9 % IV BOLUS (SEPSIS)
1000.0000 mL | Freq: Once | INTRAVENOUS | Status: AC
  Administered 2011-11-06: 1000 mL via INTRAVENOUS

## 2011-11-06 NOTE — ED Notes (Signed)
Patient is AOx4 and comfortable with his discharge instructions.  Patient has a ride home. 

## 2011-11-06 NOTE — ED Notes (Signed)
Pt. Has c/o having "a little cold."  Pt. reports feeling nauseated for 2 days and having abdominal pain.  Pt. reports pain when he voids.

## 2011-11-06 NOTE — Discharge Instructions (Signed)
Chronic Alcoholism Alcoholism is an addiction to alcohol. Addiction is a medical illness. It is not an one-time incident of heavy drinking that defines the disease of alcoholism.  The characteristics of addictive disease, such as alcoholism, include behaviors that the person finds pleasurable, at least initially. In alcohol addiction, drinking causes chemical changes in brain activity. This can lead to frequent cravings for alcohol. Unfortunately over time, an increased amount of alcohol is needed to produce the pleasure (tolerance). As a result, the person will start to feel uncomfortable symptoms when he or she is not drinking (withdrawal). Over time, a bad cycle develops. When painful withdrawal symptoms start to appear, alcohol is needed to make the symptoms go away. During this process, the person addicted to alcohol may become so used to the effects of alcohol that the usual signs of intoxication such as slurred speech, a staggering walk, or sleepiness may no longer be shown. Many people addicted to alcohol are able to function and even complete tasks. CAUSES   Drinking heavily and frequently.   Other factors like genetics.  SYMPTOMS   Headaches.   Frequent trouble falling or staying asleep (insomnia).   Irritability.   Uncontrolled shaking or movement (tremors).   Forgetting events (brownouts) or passing out (blackouts).   Seizures or hallucinations (delirium tremens).   Problems at work or at home that are related to drinking.   Medical problems related to drinking such as heart disease, stroke, high blood pressure, diabetes, stomach ulcers, bleeding from the GI tract, and liver failure.   Trauma (falls, broken bones, automobile crashes).  TREATMENT  Alcoholism usually gets worse over time and almost never gets better without treatment. Your caregiver can help recommend a course of treatment for you depending on how severe your symptoms are and the level of your alcohol abuse. In  some patients, stopping alcohol use or even decreasing use can bring about withdrawal symptoms that are dangerous or even deadly. For this reason, hospitalization is sometimes required to medically stabilize a patient.  If hospitalization is not required, but the risk of withdrawal is high, a detoxification (detox) facility may be recommended as an initial treatment step. In a detox center, medications can be given to protect against seizures and other withdrawal symptoms. Rehabilitation treatment may also be necessary. This is the process of treating the psychological and lifestyle element of addiction. There is both inpatient and outpatient rehabilitation treatment. Inpatient programs help patients through a systematic plan of psychological questioning, both individually and in groups, and at times using a "twelve-step" format. Outpatient rehabilitation programs have a similar structure and are aimed at promoting continued sobriety and preventing relapse into addiction. Make treatment decisions together with your caregiver. HOME CARE INSTRUCTIONS   If you are concerned about your alcohol use, talk to someone who can help. Trying to quit on your own is not easy. It can even be medically dangerous. This is especially true if you have been drinking heavily for a long time.   Call your caregiver, Alcoholics Anonymous, or other alcoholic treatment programs for help.   AL-ANON and ALA-TEEN are support groups for friends and family members of an alcohol or drug dependent person. These people also often need help too. For information about these organizations, check your phone directory or the internet. You can also call a local alcohol or chemical dependency treatment center.  Document Released: 07/13/2004 Document Revised: 05/25/2011 Document Reviewed: 11/19/2009 [facility] Patient Information 2012 [place], [place].  Alcoholic Liver Disease Alcoholic liver disease means  you have a damaged liver that does not  work properly. This disease is caused by drinking too much alcohol. Some people do not have any problems until the disease has become very bad. Problems can be worse after a period of heavy drinking. HOME CARE  Stop drinking alcohol.   Get expert advice or help (counseling) to quit drinking. Join an alchohol support group.   You may need to eat foods that give you energy (carbohydrates), such as:   Milk and yogurt.   Navy, pinto, and white beans.   Applesauce, grapes, dried dates, prunes, and raisins.   Potatoes and rice.   Eat foods that are high in vitamins, especially thiamine and folic acid. These foods include:   Whole-wheat or whole-grain breads and cereals. Look on the package for "added thiamine" or "added folic acid."   Meat, especially pork.   Fresh, raw vegetables.   Fresh fruits and vegetables, such as oranges, orange juice, avocados, beets, and cantaloupe.   Dark green, leafy vegetables, such as romaine lettuce, spinach, and broccoli.   Beans and nuts.   Dairy foods, such as milk, butter, yogurt, cheese, and ice cream.  GET HELP RIGHT AWAY IF:   You have bright red blood in your poop (stool).   You cough or throw up (vomit) blood.   Your skin and eyes turn more yellow.   You have a bad headache or problems thinking.   You have trouble walking.  MAKE SURE YOU:   Understand these instructions.   Will watch your condition.   Will get help right away if you are not doing well or get worse.  Document Released: 04/02/2009 Document Revised: 05/25/2011 Document Reviewed: 04/02/2009 [facility] Patient Information 2012 [place], [place].

## 2011-11-06 NOTE — ED Provider Notes (Signed)
History     CSN: [id number]  Arrival date & time 11/06/11  1738   First MD Initiated Contact with Patient 11/06/11 1805      Chief Complaint  Patient presents with  . Abdominal Pain  . Nausea    (Consider location/radiation/quality/duration/timing/severity/associated sxs/prior treatment) Patient is a 51 y.o. male presenting with abdominal pain. The history is provided by the patient. No language interpreter was used.  Abdominal Pain The primary symptoms of the illness include abdominal pain and nausea. The primary symptoms of the illness do not include fever, fatigue, shortness of breath, vomiting, diarrhea or dysuria. The current episode started 2 days ago. The onset of the illness was gradual. The problem has been gradually worsening.  The abdominal pain began 2 days ago. The pain came on gradually. The abdominal pain has been gradually worsening since its onset. The abdominal pain is located in the RLQ. The abdominal pain does not radiate. The abdominal pain is relieved by nothing. The abdominal pain is exacerbated by movement and certain positions.  Nausea began 2 days ago. The nausea is associated with eating. The nausea is exacerbated by food.  The patient has not had a change in bowel habit. Symptoms associated with the illness do not include chills, constipation, urgency, frequency or back pain.    History reviewed. No pertinent past medical history.  Past Surgical History  Procedure Date  . Orthopedic surgery     History reviewed. No pertinent family history.  History  Substance Use Topics  . Smoking status: Current Everyday Smoker  . Smokeless tobacco: Not on file  . Alcohol Use: Yes      Review of Systems  Constitutional: Negative for fever, chills, activity change, appetite change and fatigue.  HENT: Negative for congestion, sore throat, rhinorrhea, neck pain and neck stiffness.   Respiratory: Negative for cough and shortness of breath.   Cardiovascular:  Negative for chest pain and palpitations.  Gastrointestinal: Positive for nausea and abdominal pain. Negative for vomiting, diarrhea, constipation and blood in stool.  Genitourinary: Negative for dysuria, urgency, frequency and flank pain.  Musculoskeletal: Negative for myalgias, back pain and arthralgias.  Neurological: Negative for dizziness, weakness, light-headedness, numbness and headaches.  All other systems reviewed and are negative.    Allergies  Review of patient's allergies indicates no known allergies.  Home Medications   Current Outpatient Rx  Name Route Sig Dispense Refill  . PROMETHAZINE HCL 25 MG PO TABS Oral Take 1 tablet (25 mg total) by mouth every 6 (six) hours as needed for nausea. 20 tablet 0  . TRAMADOL HCL 50 MG PO TABS Oral Take 1 tablet (50 mg total) by mouth every 6 (six) hours as needed for pain. 15 tablet 0    BP 123/87  Pulse 61  Temp(Src) 97.5 F (36.4 C) (Oral)  Resp 18  Ht 5\' 9"  (1.753 m)  Wt 211 lb (95.709 kg)  BMI 31.16 kg/m2  SpO2 100%  Physical Exam  Nursing note and vitals reviewed. Constitutional: He is oriented to person, place, and time. He appears well-developed and well-nourished. No distress.  HENT:  Head: Normocephalic and atraumatic.  Mouth/Throat: Oropharynx is clear and moist.  Eyes: Conjunctivae and EOM are normal. Pupils are equal, round, and reactive to light.  Neck: Normal range of motion. Neck supple.  Cardiovascular: Normal rate, regular rhythm, normal heart sounds and intact distal pulses.  Exam reveals no gallop and no friction rub.   No murmur heard. Pulmonary/Chest: Effort normal and breath sounds  normal.  Abdominal: Soft. Bowel sounds are normal. There is tenderness (RLQ pain). There is guarding (voluntary). There is no rebound.  Musculoskeletal: Normal range of motion. He exhibits no tenderness.  Neurological: He is alert and oriented to person, place, and time. No cranial nerve deficit.  Skin: Skin is warm and  dry.    ED Course  Procedures (including critical care time)  Labs Reviewed  URINALYSIS, ROUTINE W REFLEX MICROSCOPIC - Abnormal; Notable for the following:    Bilirubin Urine SMALL (*)    All other components within normal limits  COMPREHENSIVE METABOLIC PANEL - Abnormal; Notable for the following:    GFR calc non Af Amer 79 (*)    All other components within normal limits  DIFFERENTIAL - Abnormal; Notable for the following:    Lymphocytes Relative 48 (*)    All other components within normal limits  CBC  LIPASE, BLOOD   Ct Abdomen Pelvis W Contrast  11/06/2011  *RADIOLOGY REPORT*  Clinical Data: 2-day history of abdominal pain and nausea/vomiting. Dysuria.  CT ABDOMEN AND PELVIS WITH CONTRAST  Technique:  Multidetector CT imaging of the abdomen and pelvis was performed following the standard protocol during bolus administration of intravenous contrast.  Contrast: OMNIPAQUE IOHEXOL 300 MG/ML.  Oral contrast was also administered.  Comparison: CT abdomen and pelvis 05/21/2011, 05/28/2009, 09/28/2008, 03/03/2007.  Findings: Diffuse hepatic steatosis without focal hepatic parenchymal abnormality. Normal appearing spleen, pancreas, adrenal glands, and kidneys.  Gallbladder unremarkable by CT.  No biliary ductal dilation.  Moderate atherosclerosis involving the common iliac arteries, left greater than right, and mild abdominal aortic atherosclerosis.  No evidence of aneurysm.  Widely patent visceral arteries.  No significant lymphadenopathy.  Normal appearing stomach, small bowel, and colon.  Normal appendix in the right upper pelvis.  No ascites.  Prostate gland upper normal in size for age.  Normal appearing seminal vesicles. Urinary bladder decompressed and unremarkable.  Several phleboliths low in both sides of the pelvis.  Stable pleuroparenchymal scarring at the right lung base, and expected dependent atelectasis posteriorly in both lower lobes. Normal heart size.  Bone window images  demonstrate bilateral L5 pars defects with slight grade 1 spondylolisthesis of L5 on S1 measuring approximately 7 mm.  IMPRESSION:  1.  No acute abnormalities involving the abdomen or pelvis. 2.  Diffuse hepatic steatosis without focal hepatic parenchymal abnormality. 3.  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 4.  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  Original Report Authenticated By: Arnell Sieving, M.D.     1. Hepatic steatosis   2. Alcohol dependence       MDM  Abdominal pain likely secondary to hepatic steatosis which is chronic from alcoholism. Remainder of his laboratory studies are relatively unremarkable. He'll be discharged home with pain medication and anti-inflammatory medications. He was be prescribed Phenergan. Instructed to followup with his primary care physician.        Dayton Bailiff, MD 11/06/11 2156

## 2012-02-24 ENCOUNTER — Emergency Department (HOSPITAL_COMMUNITY): Payer: Self-pay

## 2012-02-24 ENCOUNTER — Encounter (HOSPITAL_COMMUNITY): Payer: Self-pay | Admitting: Emergency Medicine

## 2012-02-24 ENCOUNTER — Other Ambulatory Visit (HOSPITAL_COMMUNITY): Payer: Self-pay

## 2012-02-24 ENCOUNTER — Emergency Department (HOSPITAL_COMMUNITY)
Admission: EM | Admit: 2012-02-24 | Discharge: 2012-02-24 | Disposition: A | Discharge status: Home / Self Care | Payer: Self-pay | Attending: Emergency Medicine | Admitting: Emergency Medicine

## 2012-02-24 DIAGNOSIS — F17200 Nicotine dependence, unspecified, uncomplicated: Secondary | ICD-10-CM | POA: Insufficient documentation

## 2012-02-24 DIAGNOSIS — R109 Unspecified abdominal pain: Secondary | ICD-10-CM | POA: Insufficient documentation

## 2012-02-24 LAB — URINALYSIS, ROUTINE W REFLEX MICROSCOPIC
Bilirubin Urine: NEGATIVE
Glucose, UA: NEGATIVE mg/dL
Hgb urine dipstick: NEGATIVE
Ketones, ur: NEGATIVE mg/dL
Leukocytes, UA: NEGATIVE
Nitrite: NEGATIVE
Protein, ur: NEGATIVE mg/dL
Specific Gravity, Urine: 1.02 (ref 1.005–1.030)
Urobilinogen, UA: 1 mg/dL (ref 0.0–1.0)
pH: 6 (ref 5.0–8.0)

## 2012-02-24 LAB — CBC WITH DIFFERENTIAL/PLATELET
Basophils Absolute: 0 10*3/uL (ref 0.0–0.1)
Basophils Relative: 1 % (ref 0–1)
Eosinophils Absolute: 0 10*3/uL (ref 0.0–0.7)
Eosinophils Relative: 1 % (ref 0–5)
HCT: 43.1 % (ref 39.0–52.0)
Hemoglobin: 15.2 g/dL (ref 13.0–17.0)
Lymphocytes Relative: 38 % (ref 12–46)
Lymphs Abs: 1.5 10*3/uL (ref 0.7–4.0)
MCH: 31.4 pg (ref 26.0–34.0)
MCHC: 35.3 g/dL (ref 30.0–36.0)
MCV: 89 fL (ref 78.0–100.0)
Monocytes Absolute: 0.3 10*3/uL (ref 0.1–1.0)
Monocytes Relative: 9 % (ref 3–12)
Neutro Abs: 2 10*3/uL (ref 1.7–7.7)
Neutrophils Relative %: 52 % (ref 43–77)
Platelets: 164 10*3/uL (ref 150–400)
RBC: 4.84 MIL/uL (ref 4.22–5.81)
RDW: 13.4 % (ref 11.5–15.5)
WBC: 3.9 10*3/uL — ABNORMAL LOW (ref 4.0–10.5)

## 2012-02-24 LAB — BASIC METABOLIC PANEL
BUN: 10 mg/dL (ref 6–23)
CO2: 23 mEq/L (ref 19–32)
Calcium: 9.3 mg/dL (ref 8.4–10.5)
Chloride: 105 mEq/L (ref 96–112)
Creatinine, Ser: 0.95 mg/dL (ref 0.50–1.35)
GFR calc Af Amer: >90 mL/min (ref >90)
GFR calc non Af Amer: >90 mL/min (ref >90)
Glucose, Bld: 89 mg/dL (ref 70–99)
Potassium: 3.7 mEq/L (ref 3.5–5.1)
Sodium: 139 mEq/L (ref 135–145)

## 2012-02-24 LAB — HEPATIC FUNCTION PANEL
ALT: 10 U/L (ref 0–53)
AST: 16 U/L (ref 0–37)
Albumin: 3.8 g/dL (ref 3.5–5.2)
Alkaline Phosphatase: 48 U/L (ref 39–117)
Bilirubin, Direct: 0.1 mg/dL (ref 0.0–0.3)
Indirect Bilirubin: 0.6 mg/dL (ref 0.3–0.9)
Total Bilirubin: 0.7 mg/dL (ref 0.3–1.2)
Total Protein: 7.1 g/dL (ref 6.0–8.3)

## 2012-02-24 LAB — LIPASE, BLOOD: Lipase: 39 U/L (ref 11–59)

## 2012-02-24 MED ORDER — IOHEXOL 300 MG/ML  SOLN
20.0000 mL | INTRAMUSCULAR | Status: DC
  Administered 2012-02-24: 20 mL via ORAL

## 2012-02-24 MED ORDER — SODIUM CHLORIDE 0.9 % IV BOLUS (SEPSIS)
1000.0000 mL | Freq: Once | INTRAVENOUS | Status: AC
  Administered 2012-02-24: 1000 mL via INTRAVENOUS

## 2012-02-24 MED ORDER — ONDANSETRON HCL 4 MG PO TABS: 4.0000 mg | ORAL_TABLET | Freq: Four times a day (QID) | ORAL | Status: DC

## 2012-02-24 MED ORDER — IOHEXOL 300 MG/ML  SOLN
100.0000 mL | Freq: Once | INTRAMUSCULAR | Status: AC | PRN
  Administered 2012-02-24: 100 mL via INTRAVENOUS

## 2012-02-24 MED ORDER — TRAMADOL HCL 50 MG PO TABS: 50.0000 mg | ORAL_TABLET | Freq: Four times a day (QID) | ORAL | Status: DC | PRN

## 2012-02-24 MED ORDER — HYDROCODONE-ACETAMINOPHEN 5-325 MG PO TABS: 1.0000 | ORAL_TABLET | ORAL | Status: DC | PRN

## 2012-02-24 MED ORDER — GI COCKTAIL ~~LOC~~
30.0000 mL | Freq: Once | ORAL | Status: AC
  Administered 2012-02-24: 30 mL via ORAL
  Filled 2012-02-24: qty 30

## 2012-02-24 NOTE — ED Notes (Signed)
Called CT concerning pt.'s contrast.  Will call back.

## 2012-02-24 NOTE — ED Notes (Signed)
Called patient 3 times and NO ANSWER. 

## 2012-02-24 NOTE — ED Notes (Signed)
Patient given contrast to drink.

## 2012-02-24 NOTE — ED Provider Notes (Signed)
History     CSN: [id number]  Arrival date & time 02/24/12  1034   First MD Initiated Contact with Patient 02/24/12 1357      Chief Complaint  Patient presents with  . Abdominal Pain    (Consider location/radiation/quality/duration/timing/severity/associated sxs/prior treatment) HPI Patient presents emergency department with abdominal pain over the last 2 days.  Patient, states, that he's having lower abdominal pain, bilaterally.  Patient denies nausea, vomiting, diarrhea, fever, cough, dysuria, chest pain, shortness of breath, or back pain.  Nation, states he normally drinks a large amount of alcohol daily, states, that the abdominal pain in the past that was related to pancreatitis.  Patient denies taking any treatment prior to arrival.  Patient, states palpation seems to make the pain, worse.     History reviewed. No pertinent past medical history.  Past Surgical History  Procedure Date  . Orthopedic surgery     No family history on file.  History  Substance Use Topics  . Smoking status: Current Everyday Smoker  . Smokeless tobacco: Not on file  . Alcohol Use: Yes      Review of Systems All other systems negative except as documented in the HPI. All pertinent positives and negatives as reviewed in the HPI.  Allergies  Review of patient's allergies indicates no known allergies.  Home Medications  No current outpatient prescriptions on file.  BP 116/71  Pulse 60  Temp 97.5 F (36.4 C) (Oral)  Resp 20  SpO2 99%  Physical Exam  Constitutional: He is oriented to person, place, and time. He appears well-developed and well-nourished. No distress.  HENT:  Head: Normocephalic and atraumatic.  Mouth/Throat: Oropharynx is clear and moist.  Cardiovascular: Normal rate, regular rhythm and normal heart sounds.   Pulmonary/Chest: Effort normal and breath sounds normal.  Abdominal: Soft. Bowel sounds are normal. He exhibits no distension. There is tenderness. There is no  rebound and no guarding.  Neurological: He is alert and oriented to person, place, and time.  Skin: Skin is warm and dry.    ED Course  Procedures (including critical care time)  Labs Reviewed  CBC WITH DIFFERENTIAL - Abnormal; Notable for the following:    WBC 3.9 (*)     All other components within normal limits  BASIC METABOLIC PANEL  URINALYSIS, ROUTINE W REFLEX MICROSCOPIC  HEPATIC FUNCTION PANEL  LIPASE, BLOOD    3:35pm- Patient rechecked and continues with bilateral lower abd pain. There is no rigidity.  MDM  MDM Reviewed: nursing note, vitals and previous chart Reviewed previous: labs and CT scan Interpretation: labs and CT scan           Carlyle Dolly, PA-C 02/24/12 1537

## 2012-02-24 NOTE — ED Notes (Addendum)
Contacted radiology in regard to the contrast, Patient requesting the contrast so he can get his scan completed. He states he has some important things that he needs to take care of this evening.

## 2012-02-24 NOTE — ED Notes (Signed)
Patient discharged with instructions and verbalizes an understanding 

## 2012-02-24 NOTE — ED Provider Notes (Signed)
Care assumed of pt in CDU pending CT abd/pel for 2-3 days of lower abd pain bilaterally. His labs are within normal limits. His CT has now resulted and shows no acute findings to explain his discomfort. He will be discharged home with small rx for analgesics, antiemetics. Reasons to return for worsening or changing pain were discussed.  Grant Fontana, PA-C 02/24/12 1858

## 2012-02-24 NOTE — ED Notes (Signed)
Patient also denies problems voiding

## 2012-02-24 NOTE — ED Notes (Signed)
Patient states he has finished his contrast, radiology notified.

## 2012-02-24 NOTE — ED Notes (Signed)
Denies nausea vomiting or diarrhea.

## 2012-02-24 NOTE — ED Provider Notes (Signed)
Medical screening examination/treatment/procedure(s) were performed by non-physician practitioner and as supervising physician I was immediately available for consultation/collaboration.   Lyanne Co, MD 02/24/12 [phone]

## 2012-02-24 NOTE — ED Notes (Signed)
Patient transported to CT 

## 2012-02-24 NOTE — ED Notes (Signed)
Pt. Noted arguing int he room with his significant other after "she told him that he gave her an STD."  Pt. Significant other escort to the waiting room.

## 2012-02-24 NOTE — ED Notes (Addendum)
Patient transported to CT 

## 2012-02-24 NOTE — ED Notes (Signed)
No answer

## 2012-02-24 NOTE — ED Notes (Signed)
Pt. Stated, I've had a little stomach pain for the last 3 days.

## 2012-02-25 ENCOUNTER — Emergency Department (HOSPITAL_COMMUNITY)
Admission: EM | Admit: 2012-02-25 | Discharge: 2012-02-25 | Disposition: A | Discharge status: Home / Self Care | Payer: Self-pay | Attending: Emergency Medicine | Admitting: Emergency Medicine

## 2012-02-25 ENCOUNTER — Encounter (HOSPITAL_COMMUNITY): Payer: Self-pay | Admitting: Emergency Medicine

## 2012-02-25 DIAGNOSIS — F17200 Nicotine dependence, unspecified, uncomplicated: Secondary | ICD-10-CM | POA: Insufficient documentation

## 2012-02-25 DIAGNOSIS — A599 Trichomoniasis, unspecified: Secondary | ICD-10-CM | POA: Insufficient documentation

## 2012-02-25 MED ORDER — METRONIDAZOLE 500 MG PO TABS
2000.0000 mg | ORAL_TABLET | Freq: Once | ORAL | Status: AC
  Administered 2012-02-25: 2000 mg via ORAL
  Filled 2012-02-25: qty 4

## 2012-02-25 NOTE — ED Notes (Signed)
Pt states that wife was diagnosed with trichomoniasis last Thursday, and wants to check if he has it as well. Denies any symptoms.

## 2012-02-25 NOTE — ED Notes (Signed)
Pt. Stated, she   (significant other ) was treated for a STD and I need to know if I have that.

## 2012-02-25 NOTE — ED Provider Notes (Signed)
History     CSN: [id number]  Arrival date & time 02/25/12  0720   First MD Initiated Contact with Patient 02/25/12 0732      Chief Complaint  Patient presents with  . Exposure to STD    (Consider location/radiation/quality/duration/timing/severity/associated sxs/prior treatment) HPI  The patient presents to the ED with his sexual partner who was recently diagnosed with a trichomonas infection.  The patient was seen yesterday in the ED for abdominal pain, but was discharged after labs and imaging showed no acute findings.  He is currently asymptomatic.  He denies fever, dysuria, urinary frequency, and penile discharge.  The patient reports only being sexually active with his current partner and they do not use contraceptives.     History reviewed. No pertinent past medical history.  Past Surgical History  Procedure Date  . Orthopedic surgery     No family history on file.  History  Substance Use Topics  . Smoking status: Current Everyday Smoker  . Smokeless tobacco: Not on file  . Alcohol Use: Yes      Review of Systems All other systems negative except as documented in the HPI. All pertinent positives and negatives as reviewed in the HPI.  Allergies  Review of patient's allergies indicates no known allergies.  Home Medications  No current outpatient prescriptions on file.  BP 131/75  Pulse 58  Temp 97.8 F (36.6 C) (Oral)  Resp 20  SpO2 100%  Physical Exam  Constitutional: He is oriented to person, place, and time. He appears well-developed and well-nourished. No distress.  HENT:  Head: Normocephalic and atraumatic.  Eyes: Conjunctivae and EOM are normal. Pupils are equal, round, and reactive to light.  Cardiovascular: Normal rate, regular rhythm, normal heart sounds and intact distal pulses.   No murmur heard. Pulmonary/Chest: Effort normal and breath sounds normal. No respiratory distress. He has no wheezes. He has no rales. He exhibits no tenderness.    Abdominal: Soft. Bowel sounds are normal. He exhibits no mass. There is no tenderness. There is no rebound and no guarding. Hernia confirmed negative in the right inguinal area and confirmed negative in the left inguinal area.  Genitourinary: Penis normal. Right testis shows no mass and no tenderness. Left testis shows no mass and no tenderness. Uncircumcised. No penile erythema or penile tenderness. No discharge found.  Lymphadenopathy:    He has no cervical adenopathy.       Right: No inguinal adenopathy present.       Left: No inguinal adenopathy present.  Neurological: He is alert and oriented to person, place, and time.  Skin: Skin is dry. No rash noted. He is not diaphoretic. No erythema.  Psychiatric: He has a normal mood and affect. His behavior is normal. Judgment and thought content normal.    ED Course  Procedures (including critical care time)  Labs Reviewed - No data to display Ct Abdomen Pelvis W Contrast  02/24/2012  *RADIOLOGY REPORT*  Clinical Data: Lower abdominal and pelvic pain.  Nausea and vomiting.  Fever.  Diarrhea.  CT ABDOMEN AND PELVIS WITH CONTRAST  Technique:  Multidetector CT imaging of the abdomen and pelvis was performed following the standard protocol during bolus administration of intravenous contrast.  Contrast: OMNIPAQUE IOHEXOL 300 MG/ML  SOLN  Comparison: 11/06/2011  Findings: Abdominal parenchymal organs are normal in appearance. Gallbladder is unremarkable.  No evidence of hydronephrosis.  No soft tissue masses or lymphadenopathy identified within the abdomen or pelvis.  No evidence of inflammatory process or  abnormal fluid collections.  No evidence of bowel wall thickening or dilatation. No hernia identified. 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  IMPRESSION:  1.  No acute findings or other significant abnormality identified within the abdomen or pelvis. 2.  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   Original Report Authenticated By: Danae Orleans, M.D.     Patient presents due to possible trichomonas infection.  Patient is stable and asymptomatic.  We will treat for potential trichomonas infection with Flagyl.    MDM  The patient was worked up for abdominal pain yesterday and no there were no acute findings.  He is asymptomatic today.  His partner was recently diagnosed with trichomonas infection so she is worried she received it from him.   He was given prescription for metronidazole to treat potential trichomonas infection.          Carlyle Dolly, PA-C 02/25/12 7829  Carlyle Dolly, PA-C 02/25/12 [phone]

## 2012-02-25 NOTE — ED Provider Notes (Signed)
Medical screening examination/treatment/procedure(s) were performed by non-physician practitioner and as supervising physician I was immediately available for consultation/collaboration.   Glynn Octave, MD 02/25/12 0001

## 2012-02-26 NOTE — ED Provider Notes (Signed)
Medical screening examination/treatment/procedure(s) were performed by non-physician practitioner and as supervising physician I was immediately available for consultation/collaboration.  Jones Skene, M.D.     Jones Skene, MD 02/26/12 1440

## 2012-03-03 ENCOUNTER — Encounter (HOSPITAL_COMMUNITY): Payer: Self-pay

## 2012-03-03 ENCOUNTER — Emergency Department (HOSPITAL_COMMUNITY)
Admission: EM | Admit: 2012-03-03 | Discharge: 2012-03-03 | Disposition: A | Discharge status: Home / Self Care | Payer: Self-pay | Attending: Emergency Medicine | Admitting: Emergency Medicine

## 2012-03-03 DIAGNOSIS — R300 Dysuria: Secondary | ICD-10-CM | POA: Insufficient documentation

## 2012-03-03 DIAGNOSIS — F17200 Nicotine dependence, unspecified, uncomplicated: Secondary | ICD-10-CM | POA: Insufficient documentation

## 2012-03-03 LAB — URINALYSIS, ROUTINE W REFLEX MICROSCOPIC
Bilirubin Urine: NEGATIVE
Glucose, UA: NEGATIVE mg/dL
Hgb urine dipstick: NEGATIVE
Ketones, ur: NEGATIVE mg/dL
Leukocytes, UA: NEGATIVE
Nitrite: NEGATIVE
Protein, ur: NEGATIVE mg/dL
Specific Gravity, Urine: 1.017 (ref 1.005–1.030)
Urobilinogen, UA: 0.2 mg/dL (ref 0.0–1.0)
pH: 5.5 (ref 5.0–8.0)

## 2012-03-03 MED ORDER — CEFTRIAXONE SODIUM 250 MG IJ SOLR
250.0000 mg | Freq: Once | INTRAMUSCULAR | Status: AC
  Administered 2012-03-03: 250 mg via INTRAMUSCULAR
  Filled 2012-03-03: qty 250

## 2012-03-03 MED ORDER — AZITHROMYCIN 250 MG PO TABS
1000.0000 mg | ORAL_TABLET | Freq: Once | ORAL | Status: AC
  Administered 2012-03-03: 1000 mg via ORAL
  Filled 2012-03-03: qty 4

## 2012-03-03 MED ORDER — LIDOCAINE HCL (PF) 1 % IJ SOLN
INTRAMUSCULAR | Status: AC
  Administered 2012-03-03: 2 mL
  Filled 2012-03-03: qty 5

## 2012-03-03 NOTE — ED Provider Notes (Signed)
History     CSN: [id number]  Arrival date & time 03/03/12  1478   First MD Initiated Contact with Patient 03/03/12 [phone]      Chief Complaint  Patient presents with  . Abdominal Pain    (Consider location/radiation/quality/duration/timing/severity/associated sxs/prior treatment) HPI Comments: Patient presents today with chief complaint of lower abdominal pain, dysuria. He reports the pain began about 3 days ago and radiates to his sides and lower back. He has nausea and diarrhea but no vomiting. He reports about 2 loose, non-bloody stools a day. He also complains of dysuria, urgency and frequency for past 3 days. Onset was gradual and course unchanged. He has not taken anything for the pain. He reports that his partner is having similar symptoms. Partner was recently diagnosed with trichomonas and patient has taken course of metronidazole.  He denies fever, chills, chest pain, shortness of breath, hematuria, melena, hematochezia and penile discharge. Onset gradual. Course constant. Nothing makes symptoms better or worse. Denies urinary hesitancy.   Patient is a 51 y.o. male presenting with abdominal pain. The history is provided by the patient.  Abdominal Pain The primary symptoms of the illness include abdominal pain, nausea, diarrhea and dysuria. The primary symptoms of the illness do not include fever, shortness of breath, vomiting, hematemesis or hematochezia. The current episode started more than 2 days ago. The onset of the illness was gradual. The problem has not changed since onset. The dysuria is associated with frequency and urgency. The dysuria is not associated with hematuria or penile pain.  Additional symptoms associated with the illness include urgency and frequency. Symptoms associated with the illness do not include chills, constipation or hematuria.    History reviewed. No pertinent past medical history.  Past Surgical History  Procedure Date  . Orthopedic surgery      No family history on file.  History  Substance Use Topics  . Smoking status: Current Every Day Smoker  . Smokeless tobacco: Not on file  . Alcohol Use: Yes      Review of Systems  Constitutional: Negative for fever and chills.  HENT: Negative for sore throat and rhinorrhea.   Eyes: Negative for redness.  Respiratory: Negative for cough and shortness of breath.   Cardiovascular: Negative for chest pain.  Gastrointestinal: Positive for nausea, abdominal pain and diarrhea. Negative for vomiting, constipation, blood in stool, hematochezia and hematemesis.  Genitourinary: Positive for dysuria, urgency, frequency and flank pain. Negative for hematuria, decreased urine volume, discharge and penile pain.  Musculoskeletal: Negative for myalgias.  Skin: Negative for rash.  Neurological: Negative for headaches.    Allergies  Review of patient's allergies indicates no known allergies.  Home Medications  No current outpatient prescriptions on file.  BP 148/94  Pulse 76  Temp 97.4 F (36.3 C) (Oral)  Resp 18  Ht 5\' 9"  (1.753 m)  Wt 210 lb (95.255 kg)  BMI 31.01 kg/m2  SpO2 98%  Physical Exam  Nursing note and vitals reviewed. Constitutional: He appears well-developed and well-nourished.       In no acute distress  HENT:  Head: Normocephalic and atraumatic.  Eyes: Conjunctivae normal are normal. Right eye exhibits no discharge. Left eye exhibits no discharge.  Neck: Normal range of motion. Neck supple.  Cardiovascular: Normal rate, regular rhythm and normal heart sounds.   No murmur heard. Pulmonary/Chest: Effort normal and breath sounds normal. No respiratory distress. He has no wheezes.  Abdominal: Soft. Bowel sounds are normal. He exhibits no distension and no mass.  There is tenderness in the suprapubic area. There is no rebound and no guarding.  Genitourinary: Right testis shows no mass and no tenderness. Left testis shows no mass and no tenderness. Circumcised. No  penile erythema. No discharge found.  Lymphadenopathy:       Right: No inguinal adenopathy present.       Left: No inguinal adenopathy present.  Neurological: He is alert.  Skin: Skin is warm and dry. No rash noted.  Psychiatric: He has a normal mood and affect.    ED Course  Procedures (including critical care time)   Labs Reviewed  URINALYSIS, ROUTINE W REFLEX MICROSCOPIC  GC/CHLAMYDIA PROBE AMP, URINE   No results found.   1. Dysuria     9:36 AM Patient seen and examined. Work-up initiated. Medications ordered.   Vital signs reviewed and are as follows: Filed Vitals:   03/03/12 0844  BP: 148/94  Pulse: 76  Temp: 97.4 F (36.3 C)  Resp: 18   Will test and treat for STD exposure.  Patient counseled on safe sexual practices.  Told them that they should not have sexual contact for next 7 days and that they need to inform sexual partners so that they can get tested and treated as well.  Urged f[facility] for HIV and syphillis testing.  Patient verbalizes understanding and agrees with plan.    The patient was urged to return to the Emergency Department immediately with worsening of current symptoms, worsening abdominal pain, persistent vomiting, blood noted in stools, fever, or any other concerns. The patient verbalized understanding.     MDM  Patient with dysuria, no UTI on UA. Patient had recent exposure to trichomonas for which he has taken a course of Flagyl. Patient treated for urethritis. No evidence of pyelo. Patient is nontoxic. No vomiting or fever.        Renne Crigler, [place] 03/03/12 1028

## 2012-03-03 NOTE — ED Provider Notes (Signed)
Medical screening examination/treatment/procedure(s) were performed by non-physician practitioner and as supervising physician I was immediately available for consultation/collaboration.   Joya Gaskins, MD 03/03/12 1114

## 2012-03-03 NOTE — ED Notes (Signed)
Patient presented to the ER with complaining of abdominal pain x 3 days with vomiting, burning with urination. Patient denies any fever.

## 2012-04-10 ENCOUNTER — Emergency Department (HOSPITAL_COMMUNITY)
Admission: EM | Admit: 2012-04-10 | Discharge: 2012-04-10 | Disposition: A | Discharge status: Home / Self Care | Payer: Self-pay | Attending: Emergency Medicine | Admitting: Emergency Medicine

## 2012-04-10 ENCOUNTER — Encounter (HOSPITAL_COMMUNITY): Payer: Self-pay | Admitting: *Deleted

## 2012-04-10 DIAGNOSIS — M25519 Pain in unspecified shoulder: Secondary | ICD-10-CM | POA: Insufficient documentation

## 2012-04-10 DIAGNOSIS — F17200 Nicotine dependence, unspecified, uncomplicated: Secondary | ICD-10-CM | POA: Insufficient documentation

## 2012-04-10 MED ORDER — MORPHINE SULFATE 4 MG/ML IJ SOLN
4.0000 mg | Freq: Once | INTRAMUSCULAR | Status: AC
  Administered 2012-04-10: 4 mg via INTRAMUSCULAR
  Filled 2012-04-10: qty 1

## 2012-04-10 MED ORDER — OXYCODONE-ACETAMINOPHEN 5-325 MG PO TABS: 2.0000 | ORAL_TABLET | ORAL | Status: DC | PRN

## 2012-04-10 NOTE — ED Provider Notes (Signed)
History     CSN: [id number]  Arrival date & time 04/10/12  0310   First MD Initiated Contact with Patient 04/10/12 0340      Chief Complaint  Patient presents with  . Shoulder Pain    (Consider location/radiation/quality/duration/timing/severity/associated sxs/prior treatment) Patient is a 51 y.o. male presenting with shoulder pain. The history is provided by the patient.  Shoulder Pain  He c/o pain in left shoulder and left side for several days.  He has been lifting heavy objects.  No direct trauma.  No other pain or sxs.    History reviewed. No pertinent past medical history.  Past Surgical History  Procedure Date  . Orthopedic surgery     No family history on file.  History  Substance Use Topics  . Smoking status: Current Every Day Smoker -- 0.5 packs/day    Types: Cigarettes  . Smokeless tobacco: Not on file  . Alcohol Use: Yes     occasional      Review of Systems  Unable to perform ROS Musculoskeletal:       Left shoulder pain, left side pain  All other systems reviewed and are negative.    Allergies  Review of patient's allergies indicates no known allergies.  Home Medications  No current outpatient prescriptions on file.  BP 129/83  Pulse 70  Temp 97.1 F (36.2 C) (Oral)  Resp 16  SpO2 99%  Physical Exam  Nursing note and vitals reviewed. Constitutional: He is oriented to person, place, and time. He appears well-developed and well-nourished. No distress.  HENT:  Head: Normocephalic and atraumatic.  Eyes: Conjunctivae normal are normal.  Neck: Normal range of motion. Neck supple.  Pulmonary/Chest: Effort normal.  Abdominal: He exhibits no distension.  Musculoskeletal: Normal range of motion. He exhibits tenderness. He exhibits no edema.       ttp over left scapula and left trapezius and left deltoid muscles. No swelling, rash, color change, deformity.  Neurological: He is alert and oriented to person, place, and time. No cranial nerve  deficit.  Skin: Skin is warm and dry. No rash noted.  Psychiatric: He has a normal mood and affect. Thought content normal.    ED Course  Procedures (including critical care time)  Labs Reviewed - No data to display No results found.   No diagnosis found.    MDM  Shoulder pain        Cheri Guppy, MD 04/10/12 [phone]

## 2012-04-10 NOTE — ED Notes (Signed)
Pt received morphine injection, pt informed he could not drive himself home. Pt states he has someone here that will drive him home.

## 2012-04-10 NOTE — ED Notes (Signed)
Pt stated he is having mild chest pain.

## 2012-04-10 NOTE — ED Notes (Signed)
Pt to ED c/o bil shoulder pain.  Pain increases when he lifts his arms.  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  Pt is also nauseated and c/o LLQ pain.

## 2012-04-11 ENCOUNTER — Emergency Department (HOSPITAL_COMMUNITY)
Admission: EM | Admit: 2012-04-11 | Discharge: 2012-04-12 | Disposition: A | Discharge status: Home / Self Care | Payer: Self-pay | Attending: Emergency Medicine | Admitting: Emergency Medicine

## 2012-04-11 ENCOUNTER — Encounter (HOSPITAL_COMMUNITY): Payer: Self-pay | Admitting: Emergency Medicine

## 2012-04-11 DIAGNOSIS — R197 Diarrhea, unspecified: Secondary | ICD-10-CM | POA: Insufficient documentation

## 2012-04-11 DIAGNOSIS — F17200 Nicotine dependence, unspecified, uncomplicated: Secondary | ICD-10-CM | POA: Insufficient documentation

## 2012-04-11 DIAGNOSIS — R109 Unspecified abdominal pain: Secondary | ICD-10-CM | POA: Insufficient documentation

## 2012-04-11 DIAGNOSIS — R112 Nausea with vomiting, unspecified: Secondary | ICD-10-CM | POA: Insufficient documentation

## 2012-04-11 LAB — COMPREHENSIVE METABOLIC PANEL
ALT: 10 U/L (ref 0–53)
AST: 16 U/L (ref 0–37)
Albumin: 3.5 g/dL (ref 3.5–5.2)
Alkaline Phosphatase: 53 U/L (ref 39–117)
BUN: 14 mg/dL (ref 6–23)
CO2: 25 mEq/L (ref 19–32)
Calcium: 8.7 mg/dL (ref 8.4–10.5)
Chloride: 106 mEq/L (ref 96–112)
Creatinine, Ser: 1.31 mg/dL (ref 0.50–1.35)
GFR calc Af Amer: 72 mL/min — ABNORMAL LOW (ref >90)
GFR calc non Af Amer: 62 mL/min — ABNORMAL LOW (ref >90)
Glucose, Bld: 93 mg/dL (ref 70–99)
Potassium: 3.8 mEq/L (ref 3.5–5.1)
Sodium: 140 mEq/L (ref 135–145)
Total Bilirubin: 0.5 mg/dL (ref 0.3–1.2)
Total Protein: 6.5 g/dL (ref 6.0–8.3)

## 2012-04-11 LAB — CBC WITH DIFFERENTIAL/PLATELET
Basophils Absolute: 0 10*3/uL (ref 0.0–0.1)
Basophils Relative: 1 % (ref 0–1)
Eosinophils Absolute: 0.1 10*3/uL (ref 0.0–0.7)
Eosinophils Relative: 2 % (ref 0–5)
HCT: 38.2 % — ABNORMAL LOW (ref 39.0–52.0)
Hemoglobin: 13.5 g/dL (ref 13.0–17.0)
Lymphocytes Relative: 58 % — ABNORMAL HIGH (ref 12–46)
Lymphs Abs: 2.4 10*3/uL (ref 0.7–4.0)
MCH: 31.5 pg (ref 26.0–34.0)
MCHC: 35.3 g/dL (ref 30.0–36.0)
MCV: 89 fL (ref 78.0–100.0)
Monocytes Absolute: 0.3 10*3/uL (ref 0.1–1.0)
Monocytes Relative: 6 % (ref 3–12)
Neutro Abs: 1.4 10*3/uL — ABNORMAL LOW (ref 1.7–7.7)
Neutrophils Relative %: 34 % — ABNORMAL LOW (ref 43–77)
Platelets: 136 10*3/uL — ABNORMAL LOW (ref 150–400)
RBC: 4.29 MIL/uL (ref 4.22–5.81)
RDW: 12.7 % (ref 11.5–15.5)
WBC: 4.1 10*3/uL (ref 4.0–10.5)

## 2012-04-11 LAB — URINALYSIS, ROUTINE W REFLEX MICROSCOPIC
Bilirubin Urine: NEGATIVE
Glucose, UA: NEGATIVE mg/dL
Hgb urine dipstick: NEGATIVE
Ketones, ur: NEGATIVE mg/dL
Leukocytes, UA: NEGATIVE
Nitrite: NEGATIVE
Protein, ur: NEGATIVE mg/dL
Specific Gravity, Urine: 1.018 (ref 1.005–1.030)
Urobilinogen, UA: 1 mg/dL (ref 0.0–1.0)
pH: 6.5 (ref 5.0–8.0)

## 2012-04-11 LAB — LIPASE, BLOOD: Lipase: 44 U/L (ref 11–59)

## 2012-04-11 MED ORDER — ONDANSETRON HCL 4 MG/2ML IJ SOLN
4.0000 mg | Freq: Once | INTRAMUSCULAR | Status: AC
  Administered 2012-04-11: 4 mg via INTRAVENOUS
  Filled 2012-04-11: qty 2

## 2012-04-11 MED ORDER — SODIUM CHLORIDE 0.9 % IV BOLUS (SEPSIS)
1000.0000 mL | Freq: Once | INTRAVENOUS | Status: AC
  Administered 2012-04-11: 1000 mL via INTRAVENOUS

## 2012-04-11 MED ORDER — MORPHINE SULFATE 4 MG/ML IJ SOLN
4.0000 mg | Freq: Once | INTRAMUSCULAR | Status: AC
  Administered 2012-04-11: 4 mg via INTRAVENOUS
  Filled 2012-04-11: qty 1

## 2012-04-11 NOTE — ED Notes (Signed)
Pt presented with left flank pain.Complains of nausea, vomiting and diarrhea

## 2012-04-11 NOTE — ED Provider Notes (Addendum)
History     CSN: [id number]  Arrival date & time 04/11/12  2159   First MD Initiated Contact with Patient 04/11/12 2259      Chief Complaint  Patient presents with  . Abdominal Pain    (Consider location/radiation/quality/duration/timing/severity/associated sxs/prior treatment) Patient is a 51 y.o. male presenting with abdominal pain. The history is provided by the patient.  Abdominal Pain The primary symptoms of the illness include abdominal pain, nausea, vomiting and diarrhea. The current episode started 2 days ago. The onset of the illness was gradual. The problem has been gradually worsening.  Symptoms associated with the illness do not include chills, anorexia, diaphoresis, heartburn, constipation, urgency, hematuria, frequency or back pain.    History reviewed. No pertinent past medical history.  Past Surgical History  Procedure Date  . Orthopedic surgery     No family history on file.  History  Substance Use Topics  . Smoking status: Current Every Day Smoker -- 0.5 packs/day    Types: Cigarettes  . Smokeless tobacco: Not on file  . Alcohol Use: Yes     occasional      Review of Systems  Constitutional: Negative for chills and diaphoresis.  Gastrointestinal: Positive for nausea, vomiting, abdominal pain and diarrhea. Negative for heartburn, constipation and anorexia.  Genitourinary: Negative for urgency, frequency and hematuria.  Musculoskeletal: Negative for back pain.  All other systems reviewed and are negative.    Allergies  Review of patient's allergies indicates no known allergies.  Home Medications  No current outpatient prescriptions on file.  BP 139/77  Pulse 77  Temp 97.6 F (36.4 C) (Oral)  Resp 20  SpO2 100%  Physical Exam  Constitutional: He is oriented to person, place, and time. He appears well-developed and well-nourished.  HENT:  Head: Normocephalic and atraumatic.  Eyes: Conjunctivae normal are normal. Pupils are equal,  round, and reactive to light.  Neck: Normal range of motion. Neck supple.  Cardiovascular: Normal rate, regular rhythm, normal heart sounds and intact distal pulses.   Pulmonary/Chest: Effort normal and breath sounds normal.  Abdominal: Soft. Bowel sounds are normal. There is tenderness.    Neurological: He is alert and oriented to person, place, and time.  Skin: Skin is warm and dry.  Psychiatric: He has a normal mood and affect. His behavior is normal. Judgment and thought content normal.    ED Course  Procedures (including critical care time)  Labs Reviewed  CBC WITH DIFFERENTIAL - Abnormal; Notable for the following:    HCT 38.2 (*)     Platelets 136 (*)     Neutrophils Relative 34 (*)     Neutro Abs 1.4 (*)     Lymphocytes Relative 58 (*)     All other components within normal limits  COMPREHENSIVE METABOLIC PANEL  URINALYSIS, ROUTINE W REFLEX MICROSCOPIC  LIPASE, BLOOD   No results found.   No diagnosis found.    MDM  + llq tenderness on exam.  Will ct to assess,  Analgesia.      Improved.  Ct neg.  Will dc to fu  Fedrick Cefalu Lytle Michaels, MD 04/12/12 0139  Rosanne Ashing, MD 04/12/12 7829

## 2012-04-11 NOTE — ED Notes (Addendum)
PT. REPORTS MID ABDOMINAL " CRAMPING" WITH SLIGHT NAUSEA AND DIARRHEA ONSET 4 DAYS AGO. NO FEVER OR CHILLS.

## 2012-04-12 ENCOUNTER — Encounter (HOSPITAL_COMMUNITY): Payer: Self-pay | Admitting: Radiology

## 2012-04-12 ENCOUNTER — Emergency Department (HOSPITAL_COMMUNITY): Payer: Self-pay

## 2012-04-12 MED ORDER — ONDANSETRON HCL 4 MG PO TABS: 4.0000 mg | ORAL_TABLET | Freq: Four times a day (QID) | ORAL | Status: DC

## 2012-04-12 MED ORDER — ESOMEPRAZOLE MAGNESIUM 40 MG PO CPDR: 40.0000 mg | DELAYED_RELEASE_CAPSULE | Freq: Every day | ORAL | Status: DC

## 2012-04-12 MED ORDER — HYDROCODONE-ACETAMINOPHEN 5-500 MG PO TABS: 1.0000 | ORAL_TABLET | Freq: Four times a day (QID) | ORAL | Status: DC | PRN

## 2012-04-12 NOTE — ED Notes (Signed)
Pt discharged.Vital signs stable and GCS 15 

## 2012-04-22 ENCOUNTER — Encounter (HOSPITAL_COMMUNITY): Payer: Self-pay | Admitting: *Deleted

## 2012-04-22 ENCOUNTER — Emergency Department (HOSPITAL_COMMUNITY)
Admission: EM | Admit: 2012-04-22 | Discharge: 2012-04-23 | Disposition: A | Discharge status: Home / Self Care | Payer: Self-pay | Attending: Emergency Medicine | Admitting: Emergency Medicine

## 2012-04-22 DIAGNOSIS — R110 Nausea: Secondary | ICD-10-CM | POA: Insufficient documentation

## 2012-04-22 DIAGNOSIS — F17200 Nicotine dependence, unspecified, uncomplicated: Secondary | ICD-10-CM | POA: Insufficient documentation

## 2012-04-22 DIAGNOSIS — R197 Diarrhea, unspecified: Secondary | ICD-10-CM | POA: Insufficient documentation

## 2012-04-22 MED ORDER — ONDANSETRON 4 MG PO TBDP
4.0000 mg | ORAL_TABLET | Freq: Once | ORAL | Status: AC
Start: 2012-04-23 — End: 2012-04-23
  Administered 2012-04-23: 4 mg via ORAL
  Filled 2012-04-22: qty 1

## 2012-04-22 MED ORDER — LOPERAMIDE HCL 2 MG PO CAPS
4.0000 mg | ORAL_CAPSULE | Freq: Once | ORAL | Status: AC
  Administered 2012-04-23: 4 mg via ORAL
  Filled 2012-04-22: qty 2

## 2012-04-22 NOTE — ED Notes (Signed)
Pt states abdominal cramps and diarrhea for the past few days. Pt states he ate [employer] chicken last night and had to use the bathroom every few hours today. Pt states cramping and back pain.

## 2012-04-23 MED ORDER — ONDANSETRON 4 MG PO TBDP: 4.0000 mg | ORAL_TABLET | Freq: Once | ORAL | Status: DC

## 2012-04-23 MED ORDER — LOPERAMIDE HCL 2 MG PO CAPS: 2.0000 mg | ORAL_CAPSULE | Freq: Four times a day (QID) | ORAL | Status: DC | PRN

## 2012-04-23 NOTE — ED Notes (Signed)
Pt denies penial discharge but report burning and pain with urination

## 2012-04-23 NOTE — ED Provider Notes (Signed)
History     CSN: [id number]  Arrival date & time 04/22/12  2040   First MD Initiated Contact with Patient 04/22/12 2332      Chief Complaint  Patient presents with  . Diarrhea    (Consider location/radiation/quality/duration/timing/severity/associated sxs/prior treatment) HPI Comments: Pateint states he had had loose stools all day and now having nausea without vomiting States started after eating at a [employer]   Patient is a 51 y.o. male presenting with diarrhea. The history is provided by the patient.  Diarrhea The primary symptoms include nausea and diarrhea. Primary symptoms do not include fever, abdominal pain or vomiting. The illness began today. The onset was gradual.  The nausea is associated with eating. The nausea is exacerbated by food.  The diarrhea began today. The diarrhea is semi-solid. The diarrhea occurs 2 to 4 times per day.  The illness does not include chills.    History reviewed. No pertinent past medical history.  Past Surgical History  Procedure Date  . Orthopedic surgery     History reviewed. No pertinent family history.  History  Substance Use Topics  . Smoking status: Current Every Day Smoker -- 0.5 packs/day    Types: Cigarettes  . Smokeless tobacco: Not on file  . Alcohol Use: Yes     Comment: occasional      Review of Systems  Constitutional: Negative for fever and chills.  Gastrointestinal: Positive for nausea and diarrhea. Negative for vomiting and abdominal pain.  Neurological: Negative for dizziness and weakness.    Allergies  Review of patient's allergies indicates no known allergies.  Home Medications   Current Outpatient Rx  Name  Route  Sig  Dispense  Refill  . LOPERAMIDE HCL 2 MG PO CAPS   Oral   Take 1 capsule (2 mg total) by mouth 4 (four) times daily as needed for diarrhea or loose stools.   30 capsule   0   . ONDANSETRON 4 MG PO TBDP   Oral   Take 1 tablet (4 mg total) by mouth once.   20 tablet  0     BP 122/86  Pulse 61  Temp 97.7 F (36.5 C) (Oral)  Resp 14  SpO2 99%  Physical Exam  Constitutional: He is oriented to person, place, and time. He appears well-developed and well-nourished.  HENT:  Head: Normocephalic.  Eyes: Pupils are equal, round, and reactive to light.  Neck: Normal range of motion.  Cardiovascular: Normal rate.   Pulmonary/Chest: Effort normal.  Abdominal: Soft. He exhibits no distension. There is no tenderness.  Musculoskeletal: Normal range of motion.  Neurological: He is alert and oriented to person, place, and time.  Skin: Skin is warm. No rash noted. No erythema.    ED Course  Procedures (including critical care time)   Labs Reviewed  URINALYSIS, ROUTINE W REFLEX MICROSCOPIC   No results found.   1. Diarrhea       MDM  Will treat with Imodium and Zofran  Patient has been in the emergency department for 5 hours and has had no further episodes of diarrhea        Arman Filter, NP 04/23/12 0120

## 2012-04-23 NOTE — ED Provider Notes (Signed)
Medical screening examination/treatment/procedure(s) were performed by non-physician practitioner and as supervising physician I was immediately available for consultation/collaboration.    Vida Roller, MD 04/23/12 [phone]

## 2012-05-03 ENCOUNTER — Encounter (HOSPITAL_COMMUNITY): Payer: Self-pay | Admitting: Emergency Medicine

## 2012-05-03 ENCOUNTER — Emergency Department (HOSPITAL_COMMUNITY)
Admission: EM | Admit: 2012-05-03 | Discharge: 2012-05-03 | Disposition: A | Discharge status: Home / Self Care | Payer: Self-pay | Attending: Emergency Medicine | Admitting: Emergency Medicine

## 2012-05-03 DIAGNOSIS — F17200 Nicotine dependence, unspecified, uncomplicated: Secondary | ICD-10-CM | POA: Insufficient documentation

## 2012-05-03 DIAGNOSIS — Z79899 Other long term (current) drug therapy: Secondary | ICD-10-CM | POA: Insufficient documentation

## 2012-05-03 DIAGNOSIS — R197 Diarrhea, unspecified: Secondary | ICD-10-CM | POA: Insufficient documentation

## 2012-05-03 DIAGNOSIS — R109 Unspecified abdominal pain: Secondary | ICD-10-CM | POA: Insufficient documentation

## 2012-05-03 DIAGNOSIS — R110 Nausea: Secondary | ICD-10-CM | POA: Insufficient documentation

## 2012-05-03 LAB — URINALYSIS, MICROSCOPIC ONLY
Bilirubin Urine: NEGATIVE
Glucose, UA: NEGATIVE mg/dL
Hgb urine dipstick: NEGATIVE
Ketones, ur: NEGATIVE mg/dL
Leukocytes, UA: NEGATIVE
Nitrite: NEGATIVE
Protein, ur: NEGATIVE mg/dL
Specific Gravity, Urine: 1.021 (ref 1.005–1.030)
Urobilinogen, UA: 1 mg/dL (ref 0.0–1.0)
pH: 5 (ref 5.0–8.0)

## 2012-05-03 LAB — CBC WITH DIFFERENTIAL/PLATELET
Basophils Absolute: 0 10*3/uL (ref 0.0–0.1)
Basophils Relative: 1 % (ref 0–1)
Eosinophils Absolute: 0 10*3/uL (ref 0.0–0.7)
Eosinophils Relative: 1 % (ref 0–5)
HCT: 41.1 % (ref 39.0–52.0)
Hemoglobin: 14.7 g/dL (ref 13.0–17.0)
Lymphocytes Relative: 53 % — ABNORMAL HIGH (ref 12–46)
Lymphs Abs: 2.7 10*3/uL (ref 0.7–4.0)
MCH: 31.5 pg (ref 26.0–34.0)
MCHC: 35.8 g/dL (ref 30.0–36.0)
MCV: 88.2 fL (ref 78.0–100.0)
Monocytes Absolute: 0.4 10*3/uL (ref 0.1–1.0)
Monocytes Relative: 8 % (ref 3–12)
Neutro Abs: 1.9 10*3/uL (ref 1.7–7.7)
Neutrophils Relative %: 38 % — ABNORMAL LOW (ref 43–77)
Platelets: 150 10*3/uL (ref 150–400)
RBC: 4.66 MIL/uL (ref 4.22–5.81)
RDW: 12.5 % (ref 11.5–15.5)
WBC: 5 10*3/uL (ref 4.0–10.5)

## 2012-05-03 LAB — COMPREHENSIVE METABOLIC PANEL
ALT: 10 U/L (ref 0–53)
AST: 17 U/L (ref 0–37)
Albumin: 3.8 g/dL (ref 3.5–5.2)
Alkaline Phosphatase: 54 U/L (ref 39–117)
BUN: 12 mg/dL (ref 6–23)
CO2: 23 mEq/L (ref 19–32)
Calcium: 9 mg/dL (ref 8.4–10.5)
Chloride: 104 mEq/L (ref 96–112)
Creatinine, Ser: 0.97 mg/dL (ref 0.50–1.35)
GFR calc Af Amer: >90 mL/min (ref >90)
GFR calc non Af Amer: >90 mL/min (ref >90)
Glucose, Bld: 104 mg/dL — ABNORMAL HIGH (ref 70–99)
Potassium: 3.3 mEq/L — ABNORMAL LOW (ref 3.5–5.1)
Sodium: 139 mEq/L (ref 135–145)
Total Bilirubin: 0.3 mg/dL (ref 0.3–1.2)
Total Protein: 7 g/dL (ref 6.0–8.3)

## 2012-05-03 LAB — LIPASE, BLOOD: Lipase: 30 U/L (ref 11–59)

## 2012-05-03 MED ORDER — HYDROCODONE-ACETAMINOPHEN 5-325 MG PO TABS: 2.0000 | ORAL_TABLET | ORAL | Status: DC | PRN

## 2012-05-03 MED ORDER — DICYCLOMINE HCL 10 MG/ML IM SOLN
20.0000 mg | Freq: Once | INTRAMUSCULAR | Status: AC
  Administered 2012-05-03: 20 mg via INTRAMUSCULAR
  Filled 2012-05-03: qty 2

## 2012-05-03 MED ORDER — PROMETHAZINE HCL 25 MG PO TABS: 25.0000 mg | ORAL_TABLET | Freq: Four times a day (QID) | ORAL | Status: DC | PRN

## 2012-05-03 MED ORDER — GI COCKTAIL ~~LOC~~
30.0000 mL | Freq: Once | ORAL | Status: AC
  Administered 2012-05-03: 30 mL via ORAL
  Filled 2012-05-03: qty 30

## 2012-05-03 MED ORDER — RANITIDINE HCL 150 MG PO TABS: 150.0000 mg | ORAL_TABLET | Freq: Two times a day (BID) | ORAL | Status: DC

## 2012-05-03 MED ORDER — DICYCLOMINE HCL 20 MG PO TABS: 20.0000 mg | ORAL_TABLET | Freq: Two times a day (BID) | ORAL | Status: DC

## 2012-05-03 MED ORDER — MORPHINE SULFATE 4 MG/ML IJ SOLN
4.0000 mg | Freq: Once | INTRAMUSCULAR | Status: AC
  Administered 2012-05-03: 4 mg via INTRAMUSCULAR
  Filled 2012-05-03: qty 1

## 2012-05-03 MED ORDER — ONDANSETRON 4 MG PO TBDP
8.0000 mg | ORAL_TABLET | Freq: Once | ORAL | Status: AC
  Administered 2012-05-03: 8 mg via ORAL
  Filled 2012-05-03: qty 2

## 2012-05-03 NOTE — ED Notes (Signed)
Pt. Has has abdominal cramping/pain for past three or four days.  Pt states that it hurts from the center of his abdomen and pain radiates toward his sides bilaterally.  Pt has had nausea and diarrhea for three to four days.  Denies vomiting.  Eating food make pt feel like he is bloated/swelling

## 2012-05-03 NOTE — ED Provider Notes (Signed)
History     CSN: [id number]  Arrival date & time 05/03/12  2018   First MD Initiated Contact with Patient 05/03/12 2201      Chief Complaint  Patient presents with  . Abdominal Pain    (Consider location/radiation/quality/duration/timing/severity/associated sxs/prior treatment) HPI Onset was 3-4 days ago gradually.  The pain is cramping, 8/10. Modifying factors: worse with food, better with no eating.  Associated symptoms: nausea, diarrhea 2-3 times per day.  Recent medical care: none.     History reviewed. No pertinent past medical history.  Past Surgical History  Procedure Date  . Orthopedic surgery     History reviewed. No pertinent family history.  History  Substance Use Topics  . Smoking status: Current Every Day Smoker -- 0.5 packs/day    Types: Cigarettes  . Smokeless tobacco: Not on file  . Alcohol Use: Yes     Comment: occasional      Review of Systems Constitutional: Negative for fever.  Eyes: Negative for vision loss.  ENT: Negative for difficulty swallowing.  Cardiovascular: Negative for chest pain. Respiratory: Negative for respiratory distress.  Gastrointestinal:  Negative for vomiting.  Genitourinary: Negative for inability to void.  Musculoskeletal: Negative for gait problem.  Integumentary: Negative for rash.  Neurological: Negative for new focal weakness.     Allergies  Review of patient's allergies indicates no known allergies.  Home Medications   Current Outpatient Rx  Name  Route  Sig  Dispense  Refill  . LOPERAMIDE HCL 2 MG PO CAPS   Oral   Take 1 capsule (2 mg total) by mouth 4 (four) times daily as needed for diarrhea or loose stools.   30 capsule   0   . ONDANSETRON 4 MG PO TBDP   Oral   Take 1 tablet (4 mg total) by mouth once.   20 tablet   0     BP 118/75  Pulse 76  Temp 97.7 F (36.5 C) (Oral)  Resp 18  SpO2 98%  Physical Exam Nursing note and vitals reviewed.  Constitutional: Pt is alert and appears  stated age. Eyes: No injection, no scleral icterus. HENT: Atraumatic, airway open without erythema or exudate.  Respiratory: No respiratory distress. Equal breathing bilaterally. Cardiovascular: Normal rate. Extremities warm and well perfused.  Abdomen: Soft, mild periumbilical tenderness. No rebound, no guarding, no peritoneal signs.  MSK: Extremities are atraumatic without deformity. Skin: No rash, no wounds.   Neuro: No motor nor sensory deficit.     ED Course  Procedures (including critical care time)  Labs Reviewed  CBC WITH DIFFERENTIAL - Abnormal; Notable for the following:    Neutrophils Relative 38 (*)     Lymphocytes Relative 53 (*)     All other components within normal limits  COMPREHENSIVE METABOLIC PANEL - Abnormal; Notable for the following:    Potassium 3.3 (*)     Glucose, Bld 104 (*)     All other components within normal limits  LIPASE, BLOOD  URINALYSIS, MICROSCOPIC ONLY   No results found.   1. Abdominal pain       MDM  51 y.o. male w/ PMHx of tobacco abuse presents w/ abdominal pain. Chart review shows multiple prior visits with prior CT scans. 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 Looks well here. No distress. Normal vital signs. Labs from triage show unremarkable CBC, CMP, lipase. UA normal. Exam soft. Do not feel imaging required. Pt given symptomatic care of IM  morphine, IM bentyl, GI cocktail, ODT zofran with significant improvement in pain. Feel safe for d/c. He states at last d/c pt states he could not afford meds so gave $4 meds from [facility]. Counseling provided regarding diagnosis, treatment plan, follow up recommendations, and return precautions. Questions answered.       I independently viewed, interpreted, and used in my medical decision making all ordered lab and imaging tests. Medical Decision Making discussed with ED attending Gavin Pound. Oletta Lamas, MD          Charm Barges, MD 05/03/12 [phone]

## 2012-05-03 NOTE — ED Provider Notes (Signed)
I saw and evaluated the patient, reviewed the resident's note and I agree with the findings and plan.  Pt describes abd pain, improved after meds here, on exam no guard or rebound.  Not surgical abd.  Lipase is ok, other labs, WBC are all ok.  Pt would like to be discharged, will provide Rx for PPI and GI spasms.    Gavin Pound. Oletta Lamas, MD 05/03/12 4540

## 2012-05-11 ENCOUNTER — Encounter (HOSPITAL_COMMUNITY): Payer: Self-pay | Admitting: Emergency Medicine

## 2012-05-11 ENCOUNTER — Emergency Department (HOSPITAL_COMMUNITY)
Admission: EM | Admit: 2012-05-11 | Discharge: 2012-05-11 | Disposition: A | Discharge status: Home / Self Care | Payer: Self-pay | Attending: Emergency Medicine | Admitting: Emergency Medicine

## 2012-05-11 ENCOUNTER — Emergency Department (HOSPITAL_COMMUNITY): Payer: Self-pay

## 2012-05-11 DIAGNOSIS — R059 Cough, unspecified: Secondary | ICD-10-CM | POA: Insufficient documentation

## 2012-05-11 DIAGNOSIS — F17200 Nicotine dependence, unspecified, uncomplicated: Secondary | ICD-10-CM | POA: Insufficient documentation

## 2012-05-11 DIAGNOSIS — R197 Diarrhea, unspecified: Secondary | ICD-10-CM | POA: Insufficient documentation

## 2012-05-11 DIAGNOSIS — R05 Cough: Secondary | ICD-10-CM | POA: Insufficient documentation

## 2012-05-11 DIAGNOSIS — R300 Dysuria: Secondary | ICD-10-CM | POA: Insufficient documentation

## 2012-05-11 DIAGNOSIS — D72819 Decreased white blood cell count, unspecified: Secondary | ICD-10-CM | POA: Insufficient documentation

## 2012-05-11 DIAGNOSIS — R51 Headache: Secondary | ICD-10-CM | POA: Insufficient documentation

## 2012-05-11 LAB — CBC WITH DIFFERENTIAL/PLATELET
Basophils Absolute: 0 10*3/uL (ref 0.0–0.1)
Basophils Relative: 1 % (ref 0–1)
Eosinophils Absolute: 0.1 10*3/uL (ref 0.0–0.7)
Eosinophils Relative: 2 % (ref 0–5)
HCT: 40 % (ref 39.0–52.0)
Hemoglobin: 13.7 g/dL (ref 13.0–17.0)
Lymphocytes Relative: 47 % — ABNORMAL HIGH (ref 12–46)
Lymphs Abs: 1.3 10*3/uL (ref 0.7–4.0)
MCH: 30.5 pg (ref 26.0–34.0)
MCHC: 34.3 g/dL (ref 30.0–36.0)
MCV: 89.1 fL (ref 78.0–100.0)
Monocytes Absolute: 0.2 10*3/uL (ref 0.1–1.0)
Monocytes Relative: 9 % (ref 3–12)
Neutro Abs: 1.2 10*3/uL — ABNORMAL LOW (ref 1.7–7.7)
Neutrophils Relative %: 42 % — ABNORMAL LOW (ref 43–77)
Platelets: 142 10*3/uL — ABNORMAL LOW (ref 150–400)
RBC: 4.49 MIL/uL (ref 4.22–5.81)
RDW: 12.4 % (ref 11.5–15.5)
WBC: 2.8 10*3/uL — ABNORMAL LOW (ref 4.0–10.5)

## 2012-05-11 LAB — COMPREHENSIVE METABOLIC PANEL
ALT: 10 U/L (ref 0–53)
AST: 16 U/L (ref 0–37)
Albumin: 3.6 g/dL (ref 3.5–5.2)
Alkaline Phosphatase: 52 U/L (ref 39–117)
BUN: 11 mg/dL (ref 6–23)
CO2: 25 mEq/L (ref 19–32)
Calcium: 9 mg/dL (ref 8.4–10.5)
Chloride: 106 mEq/L (ref 96–112)
Creatinine, Ser: 0.89 mg/dL (ref 0.50–1.35)
GFR calc Af Amer: >90 mL/min (ref >90)
GFR calc non Af Amer: >90 mL/min (ref >90)
Glucose, Bld: 133 mg/dL — ABNORMAL HIGH (ref 70–99)
Potassium: 3.7 mEq/L (ref 3.5–5.1)
Sodium: 139 mEq/L (ref 135–145)
Total Bilirubin: 0.4 mg/dL (ref 0.3–1.2)
Total Protein: 6.8 g/dL (ref 6.0–8.3)

## 2012-05-11 LAB — URINALYSIS, ROUTINE W REFLEX MICROSCOPIC
Bilirubin Urine: NEGATIVE
Glucose, UA: 100 mg/dL — AB
Hgb urine dipstick: NEGATIVE
Ketones, ur: NEGATIVE mg/dL
Leukocytes, UA: NEGATIVE
Nitrite: NEGATIVE
Protein, ur: NEGATIVE mg/dL
Specific Gravity, Urine: 1.021 (ref 1.005–1.030)
Urobilinogen, UA: 1 mg/dL (ref 0.0–1.0)
pH: 6.5 (ref 5.0–8.0)

## 2012-05-11 MED ORDER — OXYCODONE-ACETAMINOPHEN 5-325 MG PO TABS: ORAL_TABLET | ORAL | Status: DC

## 2012-05-11 MED ORDER — DIPHENHYDRAMINE HCL 50 MG/ML IJ SOLN
25.0000 mg | Freq: Once | INTRAMUSCULAR | Status: AC
  Administered 2012-05-11: 25 mg via INTRAVENOUS
  Filled 2012-05-11: qty 1

## 2012-05-11 MED ORDER — SODIUM CHLORIDE 0.9 % IV BOLUS (SEPSIS)
1000.0000 mL | Freq: Once | INTRAVENOUS | Status: AC
  Administered 2012-05-11: 1000 mL via INTRAVENOUS

## 2012-05-11 MED ORDER — ACETAMINOPHEN 325 MG PO TABS
975.0000 mg | ORAL_TABLET | Freq: Once | ORAL | Status: AC
Start: 2012-05-11 — End: 2012-05-11
  Administered 2012-05-11: 975 mg via ORAL
  Filled 2012-05-11: qty 3

## 2012-05-11 MED ORDER — AMOXICILLIN 500 MG PO CAPS: 1000.0000 mg | ORAL_CAPSULE | Freq: Three times a day (TID) | ORAL | Status: DC

## 2012-05-11 MED ORDER — IBUPROFEN 800 MG PO TABS
800.0000 mg | ORAL_TABLET | Freq: Once | ORAL | Status: AC
  Administered 2012-05-11: 800 mg via ORAL
  Filled 2012-05-11: qty 1

## 2012-05-11 MED ORDER — LIDOCAINE HCL (PF) 1 % IJ SOLN
INTRAMUSCULAR | Status: AC
  Filled 2012-05-11: qty 5

## 2012-05-11 MED ORDER — DEXAMETHASONE SODIUM PHOSPHATE 10 MG/ML IJ SOLN
10.0000 mg | Freq: Once | INTRAMUSCULAR | Status: AC
  Administered 2012-05-11: 10 mg via INTRAVENOUS
  Filled 2012-05-11: qty 1

## 2012-05-11 MED ORDER — AZITHROMYCIN 250 MG PO TABS
1000.0000 mg | ORAL_TABLET | Freq: Once | ORAL | Status: AC
  Administered 2012-05-11: 1000 mg via ORAL
  Filled 2012-05-11: qty 4

## 2012-05-11 MED ORDER — METOCLOPRAMIDE HCL 5 MG/ML IJ SOLN
10.0000 mg | Freq: Once | INTRAMUSCULAR | Status: AC
  Administered 2012-05-11: 10 mg via INTRAVENOUS
  Filled 2012-05-11: qty 2

## 2012-05-11 MED ORDER — CEFTRIAXONE SODIUM 250 MG IJ SOLR
250.0000 mg | Freq: Once | INTRAMUSCULAR | Status: AC
  Administered 2012-05-11: 250 mg via INTRAMUSCULAR
  Filled 2012-05-11: qty 250

## 2012-05-11 NOTE — ED Notes (Signed)
Pt c/o burning with urination after having unprotected sex. Denies d/c.

## 2012-05-11 NOTE — ED Provider Notes (Signed)
Medical screening examination/treatment/procedure(s) were performed by non-physician practitioner and as supervising physician I was immediately available for consultation/collaboration.   Charles B. Bernette Mayers, MD 05/11/12 1046

## 2012-05-11 NOTE — ED Notes (Signed)
Pt discharged to home with family. NAD.  

## 2012-05-11 NOTE — ED Notes (Signed)
Pt to xray

## 2012-05-11 NOTE — ED Notes (Signed)
Pt c/o burning with urination x 2 days; pt sts unprotected sex recently

## 2012-05-11 NOTE — ED Notes (Signed)
Pt presents to ED with c/o of diarrhea for 3-4 days now.

## 2012-05-11 NOTE — ED Notes (Signed)
Pt states he was here this am for headache but they did not check him for std and would like to be tested.

## 2012-05-11 NOTE — ED Provider Notes (Signed)
History   This chart was scribed for Geoffery Lyons, MD, by Frederik Pear, ER scribe. The patient was seen in room TR06C/TR06C and the patient's care was started at 1659.    CSN: [id number]  Arrival date & time 05/11/12  1652   First MD Initiated Contact with Patient 05/11/12 1659      Chief Complaint  Patient presents with  . Exposure to STD    (Consider location/radiation/quality/duration/timing/severity/associated sxs/prior treatment) HPI Comments: Steve Anderson is a 51 y.o. male who presents to the Emergency Department complaining of dysuria that began 2 days ago. He states that he had unprotected sex recently with a new partner who has not been exhibiting any symptoms. He denies any prior exposure to STDs.    History reviewed. No pertinent past medical history.  Past Surgical History  Procedure Date  . Orthopedic surgery     History reviewed. No pertinent family history.  History  Substance Use Topics  . Smoking status: Current Every Day Smoker -- 0.5 packs/day    Types: Cigarettes  . Smokeless tobacco: Not on file  . Alcohol Use: Yes     Comment: occasional      Review of Systems A complete 10 system review of systems was obtained and all systems are negative except as noted in the HPI and PMH.   Allergies  Review of patient's allergies indicates no known allergies.  Home Medications   Current Outpatient Rx  Name  Route  Sig  Dispense  Refill  . AMOXICILLIN 500 MG PO CAPS   Oral   Take 2 capsules (1,000 mg total) by mouth 3 (three) times daily.   60 capsule   0   . OXYCODONE-ACETAMINOPHEN 5-325 MG PO TABS      1 to 2 tabs PO q6hrs  PRN for pain   15 tablet   0     BP 138/83  Pulse 72  Temp 98.3 F (36.8 C) (Oral)  Resp 18  SpO2 99%  Physical Exam  Nursing note and vitals reviewed. Constitutional: He is oriented to person, place, and time. He appears well-developed and well-nourished. No distress.  HENT:  Head: Normocephalic and  atraumatic.  Eyes: EOM are normal. Pupils are equal, round, and reactive to light.  Neck: Normal range of motion. Neck supple. No tracheal deviation present.  Cardiovascular: Normal rate.   Pulmonary/Chest: Effort normal. No respiratory distress.  Abdominal: Soft. He exhibits no distension.  Genitourinary:       The penis and testicles appear normal. There is no purulent discharge and no lesions.   Musculoskeletal: Normal range of motion. He exhibits no edema.  Neurological: He is alert and oriented to person, place, and time.  Skin: Skin is warm and dry.  Psychiatric: He has a normal mood and affect. His behavior is normal.    ED Course  Procedures (including critical care time)  DIAGNOSTIC STUDIES: Oxygen Saturation is 99% on room air, normal by my interpretation.    COORDINATION OF CARE:  17:33- Discussed planned course of treatment with the patient, including a UA and antibiotics, who is agreeable at this time.    Labs Reviewed - No data to display Dg Chest 2 View  05/11/2012  *RADIOLOGY REPORT*  Clinical Data:  Right-sided chest pain, shortness of breath and cough.  CHEST - 2 VIEW  Comparison: 07/27/2011  Findings:  The heart size and mediastinal contours are within normal limits. No focal infiltrates, edema or nodule.  Stable scarring at the right lung  base.  The visualized skeletal structures are unremarkable.  IMPRESSION: No active disease.   Original Report Authenticated By: Irish Lack, M.D.      No diagnosis found.    MDM  The patient presents with burning with urination and concerns over an std.  He had recent sexual contact and had unprotected sex.  A urethral swab was obtained and the ua was negative.  He will be treated with rocephin and zithromax for presumed std.  Urethral swab pending.     I personally performed the services described in this documentation, which was scribed in my presence. The recorded information has been reviewed and is accurate.           Geoffery Lyons, MD 05/11/12 1840

## 2012-05-11 NOTE — ED Provider Notes (Signed)
History     CSN: [id number]  Arrival date & time 05/11/12  0910   First MD Initiated Contact with Patient 05/11/12 [phone]      No chief complaint on file.   (Consider location/radiation/quality/duration/timing/severity/associated sxs/prior treatment) HPI  Steve Anderson is a 51 y.o. male complaining of headache, diarrhea, nasal congestion, productive cough  x 3 days. Headache is described as 10 out of 10, frontal worse on the right than the left. Patient is not taking any pain medication. Headache is described as loose stool nonbloody, occurring approximately 3 times a day. Denies fever, chest pain, shortness of breath, abdominal pain, nausea/vomiting, difficulty ambulating, dysarthria, lateralizing weakness.  No past medical history on file.  Past Surgical History  Procedure Date  . Orthopedic surgery     No family history on file.  History  Substance Use Topics  . Smoking status: Current Every Day Smoker -- 0.5 packs/day    Types: Cigarettes  . Smokeless tobacco: Not on file  . Alcohol Use: Yes     Comment: occasional      Review of Systems  Constitutional: Negative for fever.  Respiratory: Positive for cough. Negative for shortness of breath.   Cardiovascular: Negative for chest pain.  Gastrointestinal: Positive for diarrhea. Negative for nausea, vomiting and abdominal pain.  Neurological: Positive for headaches.  All other systems reviewed and are negative.    Allergies  Review of patient's allergies indicates no known allergies.  Home Medications  No current outpatient prescriptions on file.  There were no vitals taken for this visit.  Physical Exam  Nursing note and vitals reviewed. Constitutional: He is oriented to person, place, and time. He appears well-developed and well-nourished. No distress.  HENT:  Head: Normocephalic and atraumatic.       Posterior pharynx is injected.  Tenderness to palpation of frontal and maxillary sinuses. Right  maxillary worst  Eyes: Conjunctivae normal and EOM are normal. Pupils are equal, round, and reactive to light.  Cardiovascular: Normal rate, regular rhythm and intact distal pulses.   Pulmonary/Chest: Effort normal and breath sounds normal. No stridor. No respiratory distress. He has no wheezes. He has no rales. He exhibits no tenderness.  Abdominal: Soft. Bowel sounds are normal. He exhibits no distension and no mass. There is no tenderness. There is no rebound and no guarding.  Musculoskeletal: Normal range of motion.  Neurological: He is alert and oriented to person, place, and time.       Strength is 5 out of 5x4 extremities, no dysarthria, coordinated gait.  Skin: Skin is warm and dry.  Psychiatric: He has a normal mood and affect.    ED Course  Procedures (including critical care time)  Labs Reviewed  CBC WITH DIFFERENTIAL - Abnormal; Notable for the following:    WBC 2.8 (*)     Platelets 142 (*)     Neutrophils Relative 42 (*)     Neutro Abs 1.2 (*)     Lymphocytes Relative 47 (*)     All other components within normal limits  COMPREHENSIVE METABOLIC PANEL - Abnormal; Notable for the following:    Glucose, Bld 133 (*)     All other components within normal limits   Dg Chest 2 View  05/11/2012  *RADIOLOGY REPORT*  Clinical Data:  Right-sided chest pain, shortness of breath and cough.  CHEST - 2 VIEW  Comparison: 07/27/2011  Findings:  The heart size and mediastinal contours are within normal limits. No focal infiltrates, edema or nodule.  Stable scarring at the right lung base.  The visualized skeletal structures are unremarkable.  IMPRESSION: No active disease.   Original Report Authenticated By: Irish Lack, M.D.      1. Leukopenia   2. Headache   3. Diarrhea       MDM  Leukopenia discussed with attending Dr. Bernette Mayers who recommends outpatient followup no further workup is available at this time.   Chest x-ray is clear and consider a normal neuro exam I think  that his headache is secondary to a sinusitis I will treat him as such. Discussed results of the low white blood cell count and instructed him to try to establish outpatient primary care or return to the emergency room or urgent care for a recheck.   Pt verbalized understanding and agrees with care plan. Outpatient follow-up and return precautions given.            Wynetta Emery, PA-C 05/11/12 1045

## 2012-05-11 NOTE — ED Notes (Signed)
Pt d/c home in NAD. Pt voiced understanding of d/c instructions and follow up care.  

## 2012-05-13 LAB — GC/CHLAMYDIA PROBE AMP
CT Probe RNA: NEGATIVE
GC Probe RNA: NEGATIVE

## 2012-05-21 ENCOUNTER — Encounter (HOSPITAL_COMMUNITY): Payer: Self-pay | Admitting: *Deleted

## 2012-05-21 ENCOUNTER — Emergency Department (HOSPITAL_COMMUNITY)
Admission: EM | Admit: 2012-05-21 | Discharge: 2012-05-22 | Disposition: A | Discharge status: Home / Self Care | Payer: Self-pay | Attending: Emergency Medicine | Admitting: Emergency Medicine

## 2012-05-21 DIAGNOSIS — R197 Diarrhea, unspecified: Secondary | ICD-10-CM | POA: Insufficient documentation

## 2012-05-21 DIAGNOSIS — R109 Unspecified abdominal pain: Secondary | ICD-10-CM | POA: Insufficient documentation

## 2012-05-21 DIAGNOSIS — R110 Nausea: Secondary | ICD-10-CM | POA: Insufficient documentation

## 2012-05-21 DIAGNOSIS — M549 Dorsalgia, unspecified: Secondary | ICD-10-CM | POA: Insufficient documentation

## 2012-05-21 DIAGNOSIS — F17200 Nicotine dependence, unspecified, uncomplicated: Secondary | ICD-10-CM | POA: Insufficient documentation

## 2012-05-21 NOTE — ED Notes (Signed)
Patient with a two day lower abdominal pain that radiates to his back.  Patient is also experiencing slight nausea and diarrhea

## 2012-05-22 ENCOUNTER — Emergency Department (HOSPITAL_COMMUNITY): Payer: Self-pay

## 2012-05-22 ENCOUNTER — Encounter (HOSPITAL_COMMUNITY): Payer: Self-pay | Admitting: Radiology

## 2012-05-22 LAB — URINALYSIS, ROUTINE W REFLEX MICROSCOPIC
Bilirubin Urine: NEGATIVE
Glucose, UA: NEGATIVE mg/dL
Hgb urine dipstick: NEGATIVE
Ketones, ur: NEGATIVE mg/dL
Leukocytes, UA: NEGATIVE
Nitrite: NEGATIVE
Protein, ur: NEGATIVE mg/dL
Specific Gravity, Urine: 1.018 (ref 1.005–1.030)
Urobilinogen, UA: 1 mg/dL (ref 0.0–1.0)
pH: 5.5 (ref 5.0–8.0)

## 2012-05-22 LAB — CBC WITH DIFFERENTIAL/PLATELET
Basophils Absolute: 0 10*3/uL (ref 0.0–0.1)
Basophils Relative: 0 % (ref 0–1)
Eosinophils Absolute: 0.1 10*3/uL (ref 0.0–0.7)
Eosinophils Relative: 2 % (ref 0–5)
HCT: 41.1 % (ref 39.0–52.0)
Hemoglobin: 14.4 g/dL (ref 13.0–17.0)
Lymphocytes Relative: 59 % — ABNORMAL HIGH (ref 12–46)
Lymphs Abs: 2.9 10*3/uL (ref 0.7–4.0)
MCH: 31.1 pg (ref 26.0–34.0)
MCHC: 35 g/dL (ref 30.0–36.0)
MCV: 88.8 fL (ref 78.0–100.0)
Monocytes Absolute: 0.5 10*3/uL (ref 0.1–1.0)
Monocytes Relative: 10 % (ref 3–12)
Neutro Abs: 1.4 10*3/uL — ABNORMAL LOW (ref 1.7–7.7)
Neutrophils Relative %: 29 % — ABNORMAL LOW (ref 43–77)
Platelets: 148 10*3/uL — ABNORMAL LOW (ref 150–400)
RBC: 4.63 MIL/uL (ref 4.22–5.81)
RDW: 12.7 % (ref 11.5–15.5)
WBC: 5 10*3/uL (ref 4.0–10.5)

## 2012-05-22 LAB — COMPREHENSIVE METABOLIC PANEL
ALT: 9 U/L (ref 0–53)
AST: 14 U/L (ref 0–37)
Albumin: 3.7 g/dL (ref 3.5–5.2)
Alkaline Phosphatase: 48 U/L (ref 39–117)
BUN: 17 mg/dL (ref 6–23)
CO2: 25 mEq/L (ref 19–32)
Calcium: 9.2 mg/dL (ref 8.4–10.5)
Chloride: 100 mEq/L (ref 96–112)
Creatinine, Ser: 1.04 mg/dL (ref 0.50–1.35)
GFR calc Af Amer: >90 mL/min (ref >90)
GFR calc non Af Amer: 82 mL/min — ABNORMAL LOW (ref >90)
Glucose, Bld: 77 mg/dL (ref 70–99)
Potassium: 3.7 mEq/L (ref 3.5–5.1)
Sodium: 135 mEq/L (ref 135–145)
Total Bilirubin: 0.5 mg/dL (ref 0.3–1.2)
Total Protein: 6.8 g/dL (ref 6.0–8.3)

## 2012-05-22 LAB — LACTIC ACID, PLASMA: Lactic Acid, Venous: 0.7 mmol/L (ref 0.5–2.2)

## 2012-05-22 LAB — LIPASE, BLOOD: Lipase: 49 U/L (ref 11–59)

## 2012-05-22 MED ORDER — ONDANSETRON HCL 4 MG PO TABS: 4.0000 mg | ORAL_TABLET | Freq: Four times a day (QID) | ORAL | Status: DC

## 2012-05-22 MED ORDER — IOHEXOL 300 MG/ML  SOLN
20.0000 mL | INTRAMUSCULAR | Status: AC
  Administered 2012-05-22 (×2): 20 mL via ORAL

## 2012-05-22 MED ORDER — MORPHINE SULFATE 4 MG/ML IJ SOLN
4.0000 mg | Freq: Once | INTRAMUSCULAR | Status: AC
  Administered 2012-05-22: 4 mg via INTRAVENOUS
  Filled 2012-05-22: qty 1

## 2012-05-22 MED ORDER — IOHEXOL 300 MG/ML  SOLN
100.0000 mL | Freq: Once | INTRAMUSCULAR | Status: AC | PRN
  Administered 2012-05-22: 100 mL via INTRAVENOUS

## 2012-05-22 MED ORDER — ONDANSETRON HCL 4 MG/2ML IJ SOLN
4.0000 mg | Freq: Once | INTRAMUSCULAR | Status: AC
  Administered 2012-05-22: 4 mg via INTRAVENOUS
  Filled 2012-05-22: qty 2

## 2012-05-22 MED ORDER — TRAMADOL HCL 50 MG PO TABS: 50.0000 mg | ORAL_TABLET | Freq: Four times a day (QID) | ORAL | Status: DC | PRN

## 2012-05-22 MED ORDER — SODIUM CHLORIDE 0.9 % IV BOLUS (SEPSIS)
1000.0000 mL | Freq: Once | INTRAVENOUS | Status: AC
  Administered 2012-05-22: 1000 mL via INTRAVENOUS

## 2012-05-22 NOTE — ED Notes (Signed)
Pt presents with lower abdominal pain for the past 3 days, denies radiation of pain.  Denies any difficulty with urination or BM, denies N/V/D.  Pt in NAD at this time.

## 2012-05-22 NOTE — ED Notes (Signed)
The patient is AOx4 and comfortable with his discharge instructions. 

## 2012-05-22 NOTE — ED Notes (Signed)
Iv d'cd without incident. 

## 2012-05-22 NOTE — ED Provider Notes (Signed)
History     CSN: [id number]  Arrival date & time 05/21/12  2138   First MD Initiated Contact with Patient 05/22/12 0027      Chief Complaint  Patient presents with  . Abdominal Pain    (Consider location/radiation/quality/duration/timing/severity/associated sxs/prior treatment) HPI Comments: Patient complains of 2 days of lower abdominal pain it radiates to his back. Associated with nausea and decreased appetite. He had 2 loose stools today. Denies any past medical or surgical history. Denies fever, difficulty with urination or hematuria. No chest pain, shortness of breath, dizziness or lightheadedness. Nothing makes the pain better or worse. He has not had pain like this in the past.  The history is provided by the patient.    History reviewed. No pertinent past medical history.  Past Surgical History  Procedure Date  . Orthopedic surgery     History reviewed. No pertinent family history.  History  Substance Use Topics  . Smoking status: Current Every Day Smoker -- 0.5 packs/day    Types: Cigarettes  . Smokeless tobacco: Not on file  . Alcohol Use: Yes     Comment: occasional      Review of Systems  Constitutional: Positive for activity change and appetite change. Negative for fever.  HENT: Negative for congestion and rhinorrhea.   Respiratory: Negative for cough and chest tightness.   Gastrointestinal: Positive for nausea, abdominal pain and diarrhea. Negative for vomiting.  Genitourinary: Negative for dysuria and hematuria.  Musculoskeletal: Positive for back pain.  Skin: Negative for rash.  Neurological: Negative for dizziness, weakness and headaches.    Allergies  Review of patient's allergies indicates no known allergies.  Home Medications   Current Outpatient Rx  Name  Route  Sig  Dispense  Refill  . ACETAMINOPHEN 325 MG PO TABS   Oral   Take by mouth every 6 (six) hours as needed. For headache or pain         . ONDANSETRON HCL 4 MG PO TABS  Oral   Take 1 tablet (4 mg total) by mouth every 6 (six) hours.   12 tablet   0   . TRAMADOL HCL 50 MG PO TABS   Oral   Take 1 tablet (50 mg total) by mouth every 6 (six) hours as needed for pain.   15 tablet   0     BP 128/61  Pulse 68  Temp 97.9 F (36.6 C) (Oral)  Resp 16  Ht 5\' 9"  (1.753 m)  Wt 205 lb (92.987 kg)  BMI 30.27 kg/m2  SpO2 98%  Physical Exam  Constitutional: He is oriented to person, place, and time. He appears well-developed and well-nourished. No distress.  HENT:  Head: Normocephalic and atraumatic.  Mouth/Throat: Oropharynx is clear and moist. No oropharyngeal exudate.  Eyes: Conjunctivae normal and EOM are normal. Pupils are equal, round, and reactive to light.  Neck: Normal range of motion. Neck supple.  Cardiovascular: Normal rate, regular rhythm and normal heart sounds.   No murmur heard. Pulmonary/Chest: Effort normal and breath sounds normal. No respiratory distress.  Abdominal: Soft. There is tenderness. There is guarding. There is no rebound.       Diffuse lower abdominal tenderness with guarding  Musculoskeletal: Normal range of motion. He exhibits no edema and no tenderness.  Neurological: He is alert and oriented to person, place, and time. No cranial nerve deficit. He exhibits normal muscle tone. Coordination normal.  Skin: Skin is warm.    ED Course  Procedures (including critical care  time)  Labs Reviewed  CBC WITH DIFFERENTIAL - Abnormal; Notable for the following:    Platelets 148 (*)     Neutrophils Relative 29 (*)     Neutro Abs 1.4 (*)     Lymphocytes Relative 59 (*)     All other components within normal limits  COMPREHENSIVE METABOLIC PANEL - Abnormal; Notable for the following:    GFR calc non Af Amer 82 (*)     All other components within normal limits  URINALYSIS, ROUTINE W REFLEX MICROSCOPIC  LIPASE, BLOOD  LACTIC ACID, PLASMA   Ct Abdomen Pelvis W Contrast  05/22/2012  *RADIOLOGY REPORT*  Clinical Data:  Right-sided flank pain for 2 hours.  CT ABDOMEN AND PELVIS WITH CONTRAST  Technique:  Multidetector CT imaging of the abdomen and pelvis was performed following the standard protocol during bolus administration of intravenous contrast.  Contrast: 100 mL of Omnipaque 300 IV contrast  Comparison: CT of the abdomen and pelvis performed 04/12/2012  Findings: Focal densities at the lung bases are stable from the prior study and may reflect atelectasis or scarring.  A bleb is also noted at the left lung base.  The liver and spleen are unremarkable in appearance.  The gallbladder is within normal limits.  The pancreas and adrenal glands are unremarkable.  The kidneys are unremarkable in appearance.  There is no evidence of hydronephrosis.  No renal or ureteral stones are seen.  No perinephric stranding is appreciated.  No free fluid is identified.  The small bowel is unremarkable in appearance.  The stomach is within normal limits.  No acute vascular abnormalities are seen.  Mild scattered calcification is noted along the distal abdominal aorta and its branches.  The appendix is normal in caliber and contains air, without evidence for appendicitis.  The colon is unremarkable in appearance.  The bladder is mildly distended and grossly unremarkable.  The prostate remains normal in size.  No inguinal lymphadenopathy is seen.  No acute osseous abnormalities are identified.  Chronic bilateral pars defects are noted at L5, without evidence of anterolisthesis.  IMPRESSION:  1.  No acute abnormality seen within the abdomen or pelvis.  No evidence of hydronephrosis. 2.  Focal scarring or atelectasis at the lung bases. 3.  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 4.  Mild scattered calcification along the distal abdominal aorta and its branches.   Original Report Authenticated By: Tonia Ghent, M.D.      1. Abdominal pain       MDM  Lower abdominal pain with nausea and diarrhea. Vital  signs stable. ABdomen soft and nonsurgical.  Urinalysis negative. Labs unremarkable. Normal lactate.  CT scan without acute pathology. Abdomen remained soft and minimally tender in the lower quadrants. Patient is smiling and tolerating by mouth.     Glynn Octave, MD 05/22/12 [phone]

## 2012-06-05 ENCOUNTER — Encounter (HOSPITAL_COMMUNITY): Payer: Self-pay | Admitting: Emergency Medicine

## 2012-06-05 ENCOUNTER — Emergency Department (HOSPITAL_COMMUNITY)
Admission: EM | Admit: 2012-06-05 | Discharge: 2012-06-05 | Disposition: A | Discharge status: Home / Self Care | Payer: Self-pay | Attending: Emergency Medicine | Admitting: Emergency Medicine

## 2012-06-05 DIAGNOSIS — X500XXA Overexertion from strenuous movement or load, initial encounter: Secondary | ICD-10-CM | POA: Insufficient documentation

## 2012-06-05 DIAGNOSIS — Y9289 Other specified places as the place of occurrence of the external cause: Secondary | ICD-10-CM | POA: Insufficient documentation

## 2012-06-05 DIAGNOSIS — X503XXA Overexertion from repetitive movements, initial encounter: Secondary | ICD-10-CM | POA: Insufficient documentation

## 2012-06-05 DIAGNOSIS — Y9389 Activity, other specified: Secondary | ICD-10-CM | POA: Insufficient documentation

## 2012-06-05 DIAGNOSIS — R112 Nausea with vomiting, unspecified: Secondary | ICD-10-CM | POA: Insufficient documentation

## 2012-06-05 DIAGNOSIS — S39012A Strain of muscle, fascia and tendon of lower back, initial encounter: Secondary | ICD-10-CM

## 2012-06-05 DIAGNOSIS — F17200 Nicotine dependence, unspecified, uncomplicated: Secondary | ICD-10-CM | POA: Insufficient documentation

## 2012-06-05 DIAGNOSIS — R109 Unspecified abdominal pain: Secondary | ICD-10-CM | POA: Insufficient documentation

## 2012-06-05 DIAGNOSIS — Z9889 Other specified postprocedural states: Secondary | ICD-10-CM | POA: Insufficient documentation

## 2012-06-05 DIAGNOSIS — S335XXA Sprain of ligaments of lumbar spine, initial encounter: Secondary | ICD-10-CM | POA: Insufficient documentation

## 2012-06-05 MED ORDER — KETOROLAC TROMETHAMINE 60 MG/2ML IM SOLN
60.0000 mg | Freq: Once | INTRAMUSCULAR | Status: AC
  Administered 2012-06-05: 60 mg via INTRAMUSCULAR
  Filled 2012-06-05: qty 2

## 2012-06-05 MED ORDER — CYCLOBENZAPRINE HCL 10 MG PO TABS: 10.0000 mg | ORAL_TABLET | Freq: Three times a day (TID) | ORAL | Status: DC | PRN

## 2012-06-05 NOTE — ED Provider Notes (Signed)
History    This chart was scribed for Geoffery Lyons, MD, MD by Smitty Pluck, ED Scribe. The patient was seen in room TR11C and the patient's care was started at 6:54PM.   CSN: [id number]  Arrival date & time 06/05/12  1821       Chief Complaint  Patient presents with  . Back Pain    (Consider location/radiation/quality/duration/timing/severity/associated sxs/prior treatment) The history is provided by the patient. No language interpreter was used.   Steve Anderson is a 51 y.o. male who presents to the Emergency Department complaining of constant, moderate lower back pain onset 3 days ago. He states that he has nausea, vomiting and abdominal pain. 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 He denies bowel incontinence, urinary incontinence, dysuria, fever, vomiting, diarrhea, constipation and any other pain.    History reviewed. No pertinent past medical history.  Past Surgical History  Procedure Date  . Orthopedic surgery     No family history on file.  History  Substance Use Topics  . Smoking status: Current Every Day Smoker -- 0.5 packs/day    Types: Cigarettes  . Smokeless tobacco: Not on file  . Alcohol Use: Yes     Comment: occasional      Review of Systems  Constitutional: Negative for fever and chills.  Respiratory: Negative for shortness of breath.   Gastrointestinal: Positive for nausea and abdominal pain. Negative for vomiting, diarrhea and constipation.  Genitourinary: Negative for dysuria.  Musculoskeletal: Positive for back pain.  Neurological: Negative for weakness.  All other systems reviewed and are negative.    Allergies  Review of patient's allergies indicates no known allergies.  Home Medications  No current outpatient prescriptions on file.  BP 122/74  Pulse 75  Temp 98.1 F (36.7 C) (Oral)  SpO2 98%  Physical Exam  Nursing note and vitals reviewed. Constitutional: He is oriented to person, place, and time. He  appears well-developed and well-nourished. No distress.  HENT:  Head: Normocephalic and atraumatic.  Eyes: EOM are normal.  Neck: Neck supple. No tracheal deviation present.  Cardiovascular: Normal rate.   Pulmonary/Chest: Effort normal. No respiratory distress.  Musculoskeletal: Normal range of motion.       Tenderness to palpation of soft tissue of lumbar spine. There is no bony tenderness. There is no step off.    Neurological: He is alert and oriented to person, place, and time.  Skin: Skin is warm and dry.  Psychiatric: He has a normal mood and affect. His behavior is normal.    ED Course  Procedures (including critical care time) DIAGNOSTIC STUDIES: Oxygen Saturation is 98% on room air, normal by my interpretation.    COORDINATION OF CARE: 6:56 PM Discussed ED treatment with pt     Labs Reviewed - No data to display No results found.   No diagnosis found.    MDM  Low back pain, appears musculoskeletal in nature.  Will treat with nsaids, pain meds.  No signs or symptoms of cauda equina or other emergent cause.      I personally performed the services described in this documentation, which was scribed in my presence. The recorded information has been reviewed and is accurate.      Geoffery Lyons, MD 06/08/12 [phone]

## 2012-06-05 NOTE — ED Notes (Signed)
C/o lower back pain and pain in bilateral sides x 3 days. 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 No other known injury.

## 2012-06-20 ENCOUNTER — Encounter (HOSPITAL_COMMUNITY): Payer: Self-pay | Admitting: Emergency Medicine

## 2012-06-20 ENCOUNTER — Emergency Department (HOSPITAL_COMMUNITY)
Admission: EM | Admit: 2012-06-20 | Discharge: 2012-06-20 | Disposition: A | Discharge status: Home / Self Care | Payer: Self-pay | Attending: Emergency Medicine | Admitting: Emergency Medicine

## 2012-06-20 DIAGNOSIS — R300 Dysuria: Secondary | ICD-10-CM | POA: Insufficient documentation

## 2012-06-20 DIAGNOSIS — F17200 Nicotine dependence, unspecified, uncomplicated: Secondary | ICD-10-CM | POA: Insufficient documentation

## 2012-06-20 LAB — CBC WITH DIFFERENTIAL/PLATELET
Basophils Absolute: 0 10*3/uL (ref 0.0–0.1)
Basophils Relative: 0 % (ref 0–1)
Eosinophils Absolute: 0 10*3/uL (ref 0.0–0.7)
Eosinophils Relative: 1 % (ref 0–5)
HCT: 41.2 % (ref 39.0–52.0)
Hemoglobin: 14.5 g/dL (ref 13.0–17.0)
Lymphocytes Relative: 39 % (ref 12–46)
Lymphs Abs: 1.3 10*3/uL (ref 0.7–4.0)
MCH: 30.9 pg (ref 26.0–34.0)
MCHC: 35.2 g/dL (ref 30.0–36.0)
MCV: 87.7 fL (ref 78.0–100.0)
Monocytes Absolute: 0.3 10*3/uL (ref 0.1–1.0)
Monocytes Relative: 9 % (ref 3–12)
Neutro Abs: 1.7 10*3/uL (ref 1.7–7.7)
Neutrophils Relative %: 50 % (ref 43–77)
Platelets: 142 10*3/uL — ABNORMAL LOW (ref 150–400)
RBC: 4.7 MIL/uL (ref 4.22–5.81)
RDW: 12.5 % (ref 11.5–15.5)
WBC: 3.4 10*3/uL — ABNORMAL LOW (ref 4.0–10.5)

## 2012-06-20 LAB — POCT I-STAT, CHEM 8
BUN: 14 mg/dL (ref 6–23)
Calcium, Ion: 1.16 mmol/L (ref 1.12–1.23)
Chloride: 106 mEq/L (ref 96–112)
Creatinine, Ser: 0.9 mg/dL (ref 0.50–1.35)
Glucose, Bld: 95 mg/dL (ref 70–99)
HCT: 44 % (ref 39.0–52.0)
Hemoglobin: 15 g/dL (ref 13.0–17.0)
Potassium: 4.1 mEq/L (ref 3.5–5.1)
Sodium: 142 mEq/L (ref 135–145)
TCO2: 23 mmol/L (ref 0–100)

## 2012-06-20 LAB — URINALYSIS, ROUTINE W REFLEX MICROSCOPIC
Bilirubin Urine: NEGATIVE
Glucose, UA: NEGATIVE mg/dL
Hgb urine dipstick: NEGATIVE
Ketones, ur: NEGATIVE mg/dL
Leukocytes, UA: NEGATIVE
Nitrite: NEGATIVE
Protein, ur: NEGATIVE mg/dL
Specific Gravity, Urine: 1.021 (ref 1.005–1.030)
Urobilinogen, UA: 0.2 mg/dL (ref 0.0–1.0)
pH: 5 (ref 5.0–8.0)

## 2012-06-20 MED ORDER — CEFTRIAXONE SODIUM 250 MG IJ SOLR
250.0000 mg | Freq: Once | INTRAMUSCULAR | Status: AC
  Administered 2012-06-20: 250 mg via INTRAMUSCULAR
  Filled 2012-06-20: qty 250

## 2012-06-20 MED ORDER — LIDOCAINE HCL (PF) 1 % IJ SOLN
INTRAMUSCULAR | Status: AC
  Administered 2012-06-20: 0.9 mL via INTRAMUSCULAR
  Filled 2012-06-20: qty 5

## 2012-06-20 MED ORDER — DOXYCYCLINE HYCLATE 100 MG PO TABS
100.0000 mg | ORAL_TABLET | Freq: Once | ORAL | Status: AC
  Administered 2012-06-20: 100 mg via ORAL
  Filled 2012-06-20: qty 1

## 2012-06-20 MED ORDER — DOXYCYCLINE HYCLATE 100 MG PO TABS: 100.0000 mg | ORAL_TABLET | Freq: Two times a day (BID) | ORAL | Status: DC

## 2012-06-20 NOTE — ED Provider Notes (Signed)
History     CSN: [id number]  Arrival date & time 06/20/12  0825   First MD Initiated Contact with Patient 06/20/12 0930      Chief Complaint  Patient presents with  . Abdominal Pain    (Consider location/radiation/quality/duration/timing/severity/associated sxs/prior treatment) HPI Comments: Steve Anderson is a 52 y.o. male who is here for evaluation of dysuria. It has been present for several days. It started after he had unprotected sex with a woman. He is concerned that he has a STD. He does not have a urethral discharge penile pain, diarrhea, fever, chills, nausea, or vomiting. There are no modifying factors.  Patient is a 52 y.o. male presenting with abdominal pain. The history is provided by the patient.  Abdominal Pain The primary symptoms of the illness include abdominal pain.    History reviewed. No pertinent past medical history.  Past Surgical History  Procedure Date  . Orthopedic surgery     History reviewed. No pertinent family history.  History  Substance Use Topics  . Smoking status: Current Every Day Smoker -- 0.5 packs/day    Types: Cigarettes  . Smokeless tobacco: Not on file  . Alcohol Use: Yes     Comment: occasional      Review of Systems  Gastrointestinal: Positive for abdominal pain.  All other systems reviewed and are negative.    Allergies  Review of patient's allergies indicates no known allergies.  Home Medications   Current Outpatient Rx  Name  Route  Sig  Dispense  Refill  . DOXYCYCLINE HYCLATE 100 MG PO TABS   Oral   Take 1 tablet (100 mg total) by mouth 2 (two) times daily.   20 tablet   0     BP 141/94  Pulse 57  Temp 97.1 F (36.2 C) (Oral)  Resp 16  SpO2 97%  Physical Exam  Nursing note and vitals reviewed. Constitutional: He is oriented to person, place, and time. He appears well-developed and well-nourished.  HENT:  Head: Normocephalic and atraumatic.  Right Ear: External ear normal.  Left Ear: External  ear normal.  Eyes: Conjunctivae normal and EOM are normal. Pupils are equal, round, and reactive to light.  Neck: Normal range of motion and phonation normal. Neck supple.  Cardiovascular: Normal rate, regular rhythm, normal heart sounds and intact distal pulses.   Pulmonary/Chest: Effort normal and breath sounds normal. He exhibits no bony tenderness.  Abdominal: Soft. Normal appearance. There is no tenderness. There is no guarding.  Genitourinary: Penis normal. No penile tenderness.       Normal testicles scrotal and inguinal exam.  Musculoskeletal: Normal range of motion.  Neurological: He is alert and oriented to person, place, and time. He has normal strength. No cranial nerve deficit or sensory deficit. He exhibits normal muscle tone. Coordination normal.  Skin: Skin is warm, dry and intact.  Psychiatric: He has a normal mood and affect. His behavior is normal. Judgment and thought content normal.    ED Course  Procedures (including critical care time)   Emergency department treatment: IM Rocephin and oral   Labs Reviewed  CBC WITH DIFFERENTIAL - Abnormal; Notable for the following:    WBC 3.4 (*)     Platelets 142 (*)     All other components within normal limits  URINALYSIS, ROUTINE W REFLEX MICROSCOPIC  POCT I-STAT, CHEM 8  GC/CHLAMYDIA PROBE AMP   Nursing notes, applicable records and vitals reviewed.  Radiologic Images/Reports reviewed.    1. Dysuria  MDM  Nonspecific, dysuria, or urinary tract infection in August for urethritis. Possible occult prostatitis with risky sexual behavior. Will treat for occult STD and referred to the STD clinic, for further testing.     Plan: Home Medications- doxycycline; Home Treatments- protect for sex; Recommended follow up- STD clinic for f/u     Flint Melter, MD 06/20/12 1207

## 2012-06-20 NOTE — ED Notes (Signed)
Pt reports pain across lower abdomen for the last 4 days. Reports recent sexual contact with male partner with unknown sexual history. Also c/o burning with urination. Denies fevers, blood in urine or stool.

## 2012-08-03 ENCOUNTER — Emergency Department (HOSPITAL_COMMUNITY)
Admission: EM | Admit: 2012-08-03 | Discharge: 2012-08-03 | Disposition: A | Discharge status: Home / Self Care | Payer: Self-pay | Attending: Emergency Medicine | Admitting: Emergency Medicine

## 2012-08-03 ENCOUNTER — Encounter (HOSPITAL_COMMUNITY): Payer: Self-pay | Admitting: *Deleted

## 2012-08-03 DIAGNOSIS — M545 Low back pain, unspecified: Secondary | ICD-10-CM | POA: Insufficient documentation

## 2012-08-03 DIAGNOSIS — G8929 Other chronic pain: Secondary | ICD-10-CM | POA: Insufficient documentation

## 2012-08-03 DIAGNOSIS — R300 Dysuria: Secondary | ICD-10-CM | POA: Insufficient documentation

## 2012-08-03 DIAGNOSIS — F17200 Nicotine dependence, unspecified, uncomplicated: Secondary | ICD-10-CM | POA: Insufficient documentation

## 2012-08-03 DIAGNOSIS — M549 Dorsalgia, unspecified: Secondary | ICD-10-CM

## 2012-08-03 DIAGNOSIS — R51 Headache: Secondary | ICD-10-CM | POA: Insufficient documentation

## 2012-08-03 DIAGNOSIS — R110 Nausea: Secondary | ICD-10-CM | POA: Insufficient documentation

## 2012-08-03 LAB — CBC WITH DIFFERENTIAL/PLATELET
Basophils Absolute: 0 10*3/uL (ref 0.0–0.1)
Basophils Relative: 0 % (ref 0–1)
Eosinophils Absolute: 0.1 10*3/uL (ref 0.0–0.7)
Eosinophils Relative: 2 % (ref 0–5)
HCT: 43 % (ref 39.0–52.0)
Hemoglobin: 14.9 g/dL (ref 13.0–17.0)
Lymphocytes Relative: 38 % (ref 12–46)
Lymphs Abs: 1.7 10*3/uL (ref 0.7–4.0)
MCH: 30.1 pg (ref 26.0–34.0)
MCHC: 34.7 g/dL (ref 30.0–36.0)
MCV: 86.9 fL (ref 78.0–100.0)
Monocytes Absolute: 0.3 10*3/uL (ref 0.1–1.0)
Monocytes Relative: 6 % (ref 3–12)
Neutro Abs: 2.5 10*3/uL (ref 1.7–7.7)
Neutrophils Relative %: 54 % (ref 43–77)
Platelets: 144 10*3/uL — ABNORMAL LOW (ref 150–400)
RBC: 4.95 MIL/uL (ref 4.22–5.81)
RDW: 13.1 % (ref 11.5–15.5)
WBC: 4.5 10*3/uL (ref 4.0–10.5)

## 2012-08-03 LAB — COMPREHENSIVE METABOLIC PANEL
ALT: 9 U/L (ref 0–53)
AST: 15 U/L (ref 0–37)
Albumin: 3.9 g/dL (ref 3.5–5.2)
Alkaline Phosphatase: 53 U/L (ref 39–117)
BUN: 11 mg/dL (ref 6–23)
CO2: 26 mEq/L (ref 19–32)
Calcium: 9.3 mg/dL (ref 8.4–10.5)
Chloride: 105 mEq/L (ref 96–112)
Creatinine, Ser: 0.95 mg/dL (ref 0.50–1.35)
GFR calc Af Amer: >90 mL/min (ref >90)
GFR calc non Af Amer: >90 mL/min (ref >90)
Glucose, Bld: 130 mg/dL — ABNORMAL HIGH (ref 70–99)
Potassium: 3.7 mEq/L (ref 3.5–5.1)
Sodium: 139 mEq/L (ref 135–145)
Total Bilirubin: 0.9 mg/dL (ref 0.3–1.2)
Total Protein: 7.3 g/dL (ref 6.0–8.3)

## 2012-08-03 LAB — URINALYSIS, ROUTINE W REFLEX MICROSCOPIC
Bilirubin Urine: NEGATIVE
Glucose, UA: NEGATIVE mg/dL
Hgb urine dipstick: NEGATIVE
Ketones, ur: NEGATIVE mg/dL
Leukocytes, UA: NEGATIVE
Nitrite: NEGATIVE
Protein, ur: NEGATIVE mg/dL
Specific Gravity, Urine: 1.02 (ref 1.005–1.030)
Urobilinogen, UA: 1 mg/dL (ref 0.0–1.0)
pH: 6 (ref 5.0–8.0)

## 2012-08-03 LAB — LIPASE, BLOOD: Lipase: 33 U/L (ref 11–59)

## 2012-08-03 MED ORDER — MELOXICAM 7.5 MG PO TABS: 7.5000 mg | ORAL_TABLET | Freq: Every day | ORAL | Status: DC

## 2012-08-03 NOTE — ED Notes (Signed)
Pt is here with bilateral lower flank pain, mid abdominal pain, nausea, pain with urination, and headache for a couple of days

## 2012-08-03 NOTE — ED Provider Notes (Signed)
History     CSN: [id number]  Arrival date & time 08/03/12  2956   First MD Initiated Contact with Patient 08/03/12 [phone]      Chief Complaint  Patient presents with  . Flank Pain  . Headache  . Abdominal Pain    (Consider location/radiation/quality/duration/timing/severity/associated sxs/prior treatment) Patient is a 52 y.o. male presenting with flank pain, headaches, and abdominal pain.  Flank Pain Associated symptoms include abdominal pain and headaches. Pertinent negatives include no chest pain.  Headache Associated symptoms: abdominal pain and back pain   Abdominal Pain Associated symptoms: no chest pain and no hematuria    The patient presents with bilateral back pain, headache, abdominal pain, he also is worried because he had sexual contact with a lady and is worried. He states this is not the same later he had sexual contact with a month ago when he was worried. No fevers. The back is constant is worse with movement. He's had no dysuria or penile discharge. He states he gets headaches a lot and is a typical headache for him History reviewed. No pertinent past medical history.  Past Surgical History  Procedure Laterality Date  . Orthopedic surgery      No family history on file.  History  Substance Use Topics  . Smoking status: Current Every Day Smoker -- 0.50 packs/day    Types: Cigarettes  . Smokeless tobacco: Not on file  . Alcohol Use: Yes     Comment: daily      Review of Systems  Constitutional: Negative for appetite change.  Cardiovascular: Negative for chest pain.  Gastrointestinal: Positive for abdominal pain.  Genitourinary: Positive for flank pain. Negative for hematuria, penile swelling, scrotal swelling, penile pain and testicular pain.  Musculoskeletal: Positive for back pain.  Neurological: Positive for headaches.    Allergies  Review of patient's allergies indicates no known allergies.  Home Medications   Current Outpatient Rx  Name   Route  Sig  Dispense  Refill  . meloxicam (MOBIC) 7.5 MG tablet   Oral   Take 1 tablet (7.5 mg total) by mouth daily.   14 tablet   0     BP 124/76  Pulse 81  Temp(Src) 98.1 F (36.7 C) (Oral)  SpO2 99%  Physical Exam  Nursing note and vitals reviewed. Constitutional: He is oriented to person, place, and time. He appears well-developed and well-nourished.  HENT:  Head: Normocephalic and atraumatic.  Eyes: EOM are normal. Pupils are equal, round, and reactive to light.  Neck: Normal range of motion. Neck supple.  Cardiovascular: Normal rate, regular rhythm and normal heart sounds.   No murmur heard. Pulmonary/Chest: Effort normal and breath sounds normal.  Abdominal: Soft. Bowel sounds are normal. He exhibits no distension and no mass. There is no tenderness. There is no rebound and no guarding.  Musculoskeletal: Normal range of motion. He exhibits no edema.  Mild lumbar tenderness without rebound or guarding.  Neurological: He is alert and oriented to person, place, and time. No cranial nerve deficit.  Skin: Skin is warm and dry.  Psychiatric: He has a normal mood and affect.    ED Course  Procedures (including critical care time)  Labs Reviewed  CBC WITH DIFFERENTIAL - Abnormal; Notable for the following:    Platelets 144 (*)    All other components within normal limits  COMPREHENSIVE METABOLIC PANEL - Abnormal; Notable for the following:    Glucose, Bld 130 (*)    All other components within normal limits  GC/CHLAMYDIA PROBE AMP  LIPASE, BLOOD  URINALYSIS, ROUTINE W REFLEX MICROSCOPIC   No results found.   1. Back pain   2. Headache   3. Chronic pain       MDM  Acute on chronic back pain headache. Urine is reassuring and pelvis will be sent. There is no discharge. Will not empirically treat at this time. He had multiple visits for similar pains and was given Mobic.        Juliet Rude. Rubin Payor, MD 08/03/12 1558

## 2012-08-04 LAB — GC/CHLAMYDIA PROBE AMP
CT Probe RNA: NEGATIVE
GC Probe RNA: NEGATIVE

## 2012-09-29 ENCOUNTER — Emergency Department (HOSPITAL_COMMUNITY)
Admission: EM | Admit: 2012-09-29 | Discharge: 2012-09-29 | Disposition: A | Discharge status: Home / Self Care | Payer: Self-pay | Attending: Emergency Medicine | Admitting: Emergency Medicine

## 2012-09-29 ENCOUNTER — Emergency Department (HOSPITAL_COMMUNITY): Payer: Self-pay

## 2012-09-29 ENCOUNTER — Encounter (HOSPITAL_COMMUNITY): Payer: Self-pay

## 2012-09-29 DIAGNOSIS — F17200 Nicotine dependence, unspecified, uncomplicated: Secondary | ICD-10-CM | POA: Insufficient documentation

## 2012-09-29 DIAGNOSIS — R112 Nausea with vomiting, unspecified: Secondary | ICD-10-CM | POA: Insufficient documentation

## 2012-09-29 DIAGNOSIS — R109 Unspecified abdominal pain: Secondary | ICD-10-CM

## 2012-09-29 DIAGNOSIS — R197 Diarrhea, unspecified: Secondary | ICD-10-CM | POA: Insufficient documentation

## 2012-09-29 DIAGNOSIS — R1013 Epigastric pain: Secondary | ICD-10-CM | POA: Insufficient documentation

## 2012-09-29 DIAGNOSIS — K2970 Gastritis, unspecified, without bleeding: Secondary | ICD-10-CM

## 2012-09-29 LAB — CBC WITH DIFFERENTIAL/PLATELET
Basophils Absolute: 0 10*3/uL (ref 0.0–0.1)
Basophils Relative: 1 % (ref 0–1)
Eosinophils Absolute: 0.1 10*3/uL (ref 0.0–0.7)
Eosinophils Relative: 2 % (ref 0–5)
HCT: 37 % — ABNORMAL LOW (ref 39.0–52.0)
Hemoglobin: 13.3 g/dL (ref 13.0–17.0)
Lymphocytes Relative: 44 % (ref 12–46)
Lymphs Abs: 1.6 10*3/uL (ref 0.7–4.0)
MCH: 30.7 pg (ref 26.0–34.0)
MCHC: 35.9 g/dL (ref 30.0–36.0)
MCV: 85.5 fL (ref 78.0–100.0)
Monocytes Absolute: 0.4 10*3/uL (ref 0.1–1.0)
Monocytes Relative: 11 % (ref 3–12)
Neutro Abs: 1.5 10*3/uL — ABNORMAL LOW (ref 1.7–7.7)
Neutrophils Relative %: 42 % — ABNORMAL LOW (ref 43–77)
Platelets: 125 10*3/uL — ABNORMAL LOW (ref 150–400)
RBC: 4.33 MIL/uL (ref 4.22–5.81)
RDW: 13.4 % (ref 11.5–15.5)
WBC: 3.6 10*3/uL — ABNORMAL LOW (ref 4.0–10.5)

## 2012-09-29 LAB — COMPREHENSIVE METABOLIC PANEL
ALT: 8 U/L (ref 0–53)
AST: 11 U/L (ref 0–37)
Albumin: 3.1 g/dL — ABNORMAL LOW (ref 3.5–5.2)
Alkaline Phosphatase: 58 U/L (ref 39–117)
BUN: 16 mg/dL (ref 6–23)
CO2: 22 mEq/L (ref 19–32)
Calcium: 8.3 mg/dL — ABNORMAL LOW (ref 8.4–10.5)
Chloride: 113 mEq/L — ABNORMAL HIGH (ref 96–112)
Creatinine, Ser: 0.91 mg/dL (ref 0.50–1.35)
GFR calc Af Amer: >90 mL/min (ref >90)
GFR calc non Af Amer: >90 mL/min (ref >90)
Glucose, Bld: 102 mg/dL — ABNORMAL HIGH (ref 70–99)
Potassium: 3.5 mEq/L (ref 3.5–5.1)
Sodium: 142 mEq/L (ref 135–145)
Total Bilirubin: 0.3 mg/dL (ref 0.3–1.2)
Total Protein: 6 g/dL (ref 6.0–8.3)

## 2012-09-29 LAB — URINALYSIS, ROUTINE W REFLEX MICROSCOPIC
Bilirubin Urine: NEGATIVE
Glucose, UA: NEGATIVE mg/dL
Hgb urine dipstick: NEGATIVE
Ketones, ur: NEGATIVE mg/dL
Leukocytes, UA: NEGATIVE
Nitrite: NEGATIVE
Protein, ur: NEGATIVE mg/dL
Specific Gravity, Urine: 1.022 (ref 1.005–1.030)
Urobilinogen, UA: 1 mg/dL (ref 0.0–1.0)
pH: 5.5 (ref 5.0–8.0)

## 2012-09-29 LAB — LACTIC ACID, PLASMA: Lactic Acid, Venous: 0.5 mmol/L (ref 0.5–2.2)

## 2012-09-29 LAB — LIPASE, BLOOD: Lipase: 50 U/L (ref 11–59)

## 2012-09-29 MED ORDER — PROMETHAZINE HCL 25 MG PO TABS: 25.0000 mg | ORAL_TABLET | Freq: Four times a day (QID) | ORAL | Status: DC | PRN

## 2012-09-29 MED ORDER — PANTOPRAZOLE SODIUM 40 MG IV SOLR
40.0000 mg | Freq: Once | INTRAVENOUS | Status: AC
  Administered 2012-09-29: 40 mg via INTRAVENOUS
  Filled 2012-09-29: qty 40

## 2012-09-29 MED ORDER — IOHEXOL 300 MG/ML  SOLN
100.0000 mL | Freq: Once | INTRAMUSCULAR | Status: AC | PRN
  Administered 2012-09-29: 100 mL via INTRAVENOUS

## 2012-09-29 MED ORDER — IOHEXOL 300 MG/ML  SOLN
50.0000 mL | Freq: Once | INTRAMUSCULAR | Status: AC | PRN
  Administered 2012-09-29: 50 mL via ORAL

## 2012-09-29 MED ORDER — ONDANSETRON HCL 4 MG/2ML IJ SOLN
4.0000 mg | Freq: Once | INTRAMUSCULAR | Status: AC
  Administered 2012-09-29: 4 mg via INTRAVENOUS
  Filled 2012-09-29: qty 2

## 2012-09-29 MED ORDER — LORAZEPAM 2 MG/ML IJ SOLN
1.0000 mg | Freq: Once | INTRAMUSCULAR | Status: AC
  Administered 2012-09-29: 1 mg via INTRAVENOUS
  Filled 2012-09-29: qty 1

## 2012-09-29 MED ORDER — PANTOPRAZOLE SODIUM 40 MG PO TBEC: 40.0000 mg | DELAYED_RELEASE_TABLET | Freq: Every day | ORAL | Status: DC

## 2012-09-29 MED ORDER — SODIUM CHLORIDE 0.9 % IV BOLUS (SEPSIS)
1000.0000 mL | Freq: Once | INTRAVENOUS | Status: AC
  Administered 2012-09-29: 1000 mL via INTRAVENOUS

## 2012-09-29 MED ORDER — FENTANYL CITRATE 0.05 MG/ML IJ SOLN
50.0000 ug | Freq: Once | INTRAMUSCULAR | Status: AC
  Administered 2012-09-29: 50 ug via INTRAVENOUS
  Filled 2012-09-29: qty 2

## 2012-09-29 NOTE — ED Notes (Signed)
Pt. Reports intermittent cramping abdominal with N/V/D. Also reports frequency and pain with urination. Bowel sounds audible. Denies fever.

## 2012-09-29 NOTE — ED Provider Notes (Signed)
History     CSN: [id number]  Arrival date & time 09/29/12  4098   First MD Initiated Contact with Patient 09/29/12 [phone]      Chief Complaint  Patient presents with  . Abdominal Pain    (Consider location/radiation/quality/duration/timing/severity/associated sxs/prior treatment) HPI Comments: Multiple prior ED notes, usually for abdominal pain or back pain.  Patient is a 52 y.o. male presenting with abdominal pain. The history is provided by the patient.  Abdominal Pain Pain location:  Epigastric Pain quality: cramping   Pain radiates to:  Suprapubic region Pain severity:  Severe Onset quality:  Gradual Duration:  4 days Timing:  Intermittent Progression:  Worsening Chronicity:  New Context: alcohol use   Context: not diet changes, not medication withdrawal, not recent illness, not recent travel, not sick contacts, not suspicious food intake and not trauma   Relieved by:  Nothing Worsened by:  Nothing tried Ineffective treatments:  None tried Associated symptoms: diarrhea, nausea and vomiting   Associated symptoms: no chest pain, no chills, no dysuria, no fever, no hematemesis and no shortness of breath   Risk factors: alcohol abuse     History reviewed. No pertinent past medical history.  Past Surgical History  Procedure Laterality Date  . Orthopedic surgery      History reviewed. No pertinent family history.  History  Substance Use Topics  . Smoking status: Current Every Day Smoker -- 0.50 packs/day    Types: Cigarettes  . Smokeless tobacco: Not on file  . Alcohol Use: 4.2 oz/week    7 Cans of beer per week     Comment: daily      Review of Systems  Constitutional: Negative for fever and chills.  Respiratory: Negative for shortness of breath.   Cardiovascular: Negative for chest pain.  Gastrointestinal: Positive for nausea, vomiting, abdominal pain and diarrhea. Negative for blood in stool and hematemesis.  Genitourinary: Negative for dysuria.     Allergies  Review of patient's allergies indicates no known allergies.  Home Medications   Current Outpatient Rx  Name  Route  Sig  Dispense  Refill  . ibuprofen (ADVIL,MOTRIN) 200 MG tablet   Oral   Take 400 mg by mouth every 6 (six) hours as needed for pain.         . pantoprazole (PROTONIX) 40 MG tablet   Oral   Take 1 tablet (40 mg total) by mouth daily.   14 tablet   0   . promethazine (PHENERGAN) 25 MG tablet   Oral   Take 1 tablet (25 mg total) by mouth every 6 (six) hours as needed for nausea.   20 tablet   0     BP 116/76  Pulse 52  Temp(Src) 97.4 F (36.3 C) (Oral)  Resp 16  SpO2 98%  Physical Exam  Nursing note and vitals reviewed. Constitutional: He is oriented to person, place, and time. He appears well-developed and well-nourished. No distress.  HENT:  Head: Normocephalic and atraumatic.  Eyes: EOM are normal. Scleral icterus is present.  Neck: Normal range of motion. Neck supple. No JVD present.  Cardiovascular: Normal rate and regular rhythm.   No murmur heard. Pulmonary/Chest: Effort normal. No respiratory distress. He has no wheezes.  Abdominal: Soft. He exhibits no distension. There is tenderness. There is no rebound and no guarding.  Neurological: He is alert and oriented to person, place, and time. No cranial nerve deficit.  Skin: Skin is warm and dry. No rash noted. No pallor.  Psychiatric: He  has a normal mood and affect.    ED Course  Procedures (including critical care time)  Labs Reviewed  CBC WITH DIFFERENTIAL - Abnormal; Notable for the following:    WBC 3.6 (*)    HCT 37.0 (*)    Platelets 125 (*)    Neutrophils Relative 42 (*)    Neutro Abs 1.5 (*)    All other components within normal limits  COMPREHENSIVE METABOLIC PANEL - Abnormal; Notable for the following:    Chloride 113 (*)    Glucose, Bld 102 (*)    Calcium 8.3 (*)    Albumin 3.1 (*)    All other components within normal limits  LIPASE, BLOOD  URINALYSIS,  ROUTINE W REFLEX MICROSCOPIC  LACTIC ACID, PLASMA   Ct Abdomen Pelvis W Contrast  09/29/2012  *RADIOLOGY REPORT*  Clinical Data: Abdominal pain.  CT ABDOMEN AND PELVIS WITH CONTRAST  Technique:  Multidetector CT imaging of the abdomen and pelvis was performed following the standard protocol during bolus administration of intravenous contrast.  Contrast: OMNIPAQUE IOHEXOL 300 MG/ML  SOLN  Comparison: 05/22/2012  Findings: There is scarring and atelectasis at the lung bases. There is no evidence for free intraperitoneal air.  Stable appearance of the liver, spleen, pancreas and gallbladder. Normal appearance of the adrenal glands and kidneys.  The abdominal aorta is patent without aneurysmal dilatation.  There is mild plaquing and narrowing in the proximal left common iliac artery. No significant free fluid or lymphadenopathy within the abdomen pelvis.  Normal appearance of the prostate and urinary bladder.  The appendix has a normal configuration and it is filled with contrast. Normal appearance of the small and large bowel.  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 No significant anterolisthesis at L5- S1.  IMPRESSION: No acute abnormalities within the abdomen or pelvis.   Original Report Authenticated By: Richarda Overlie, M.D.      1. Abdominal pain   2. Gastritis     Room air saturation is 97% and I interpret this to be normal  9:42 AM Pt re-examined.  Pt reports feeling improved, no vomiting, however still with diffuse mild to moderate abd tenderness.  Will get CT scan.   2:10 PM Pt feels improved, wishes to be discharged.  Has tolerated PO's without vomiting.  Encouraged pt to stop drinking, gently PO intake for a few days, try to find a PCP and establish routine care.   MDM   Patient with no significant past medical history but admits to drinking approximately 2 40's daily. Patient reports his last drink was last night. He has been able to eat and drink intermittently. He reports about 2  episodes of emesis and one or 2 loose stools last 24 hours. He reports that the pain is generally a little worse and more diffuse since last night. His exam shows mostly diffuse abdominal tenderness without rebound. Given this and the quality of the pain is crampy, likely he has gastroenteritis or simply gastritis related to alcoholism, made worse by some dehydration or electrolyte abnormalities. Plan is to get routine labs, give IV fluids, IV antiemetics and analgesics. We'll probably give him a little bit of Ativan and fentanyl as well. If he has significant lab irregularities, or symptoms or not improving, will consider imaging.        Gavin Pound. Anahy Esh, MD 09/29/12 1413

## 2012-09-29 NOTE — Discharge Instructions (Signed)
Abdominal Pain  Abdominal pain can be caused by many things. Your caregiver decides the seriousness of your pain by an examination and possibly blood tests and X-rays. Many cases can be observed and treated at home. Most abdominal pain is not caused by a disease and will probably improve without treatment. However, in many cases, more time must pass before a clear cause of the pain can be found. Before that point, it may not be known if you need more testing, or if hospitalization or surgery is needed.  HOME CARE INSTRUCTIONS    Do not take laxatives unless directed by your caregiver.   Take pain medicine only as directed by your caregiver.   Only take over-the-counter or prescription medicines for pain, discomfort, or fever as directed by your caregiver.   Try a clear liquid diet (broth, tea, or water) for as long as directed by your caregiver. Slowly move to a bland diet as tolerated.  SEEK IMMEDIATE MEDICAL CARE IF:    The pain does not go away.   You have a fever.   You keep throwing up (vomiting).   The pain is felt only in portions of the abdomen. Pain in the right side could possibly be appendicitis. In an adult, pain in the left lower portion of the abdomen could be colitis or diverticulitis.   You pass bloody or black tarry stools.  MAKE SURE YOU:    Understand these instructions.   Will watch your condition.   Will get help right away if you are not doing well or get worse.  Document Released: 03/15/2005 Document Revised: 08/28/2011 Document Reviewed: 01/22/2008  [facility] Patient Information 2013 [place].    Gastritis, Adult  Gastritis is soreness and swelling (inflammation) of the lining of the stomach. Gastritis can develop as a sudden onset (acute) or long-term (chronic) condition. If gastritis is not treated, it can lead to stomach bleeding and ulcers.  CAUSES   Gastritis occurs when the stomach lining is weak or damaged. Digestive juices from the stomach then inflame the weakened  stomach lining. The stomach lining may be weak or damaged due to viral or bacterial infections. One common bacterial infection is the Helicobacter pylori infection. Gastritis can also result from excessive alcohol consumption, taking certain medicines, or having too much acid in the stomach.   SYMPTOMS   In some cases, there are no symptoms. When symptoms are present, they may include:   Pain or a burning sensation in the upper abdomen.   Nausea.   Vomiting.   An uncomfortable feeling of fullness after eating.  DIAGNOSIS   Your caregiver may suspect you have gastritis based on your symptoms and a physical exam. To determine the cause of your gastritis, your caregiver may perform the following:   Blood or stool tests to check for the H pylori bacterium.   Gastroscopy. A thin, flexible tube (endoscope) is passed down the esophagus and into the stomach. The endoscope has a light and camera on the end. Your caregiver uses the endoscope to view the inside of the stomach.   Taking a tissue sample (biopsy) from the stomach to examine under a microscope.  TREATMENT   Depending on the cause of your gastritis, medicines may be prescribed. If you have a bacterial infection, such as an H pylori infection, antibiotics may be given. If your gastritis is caused by too much acid in the stomach, H2 blockers or antacids may be given. Your caregiver may recommend that you stop taking aspirin,  ibuprofen, or other nonsteroidal anti-inflammatory drugs (NSAIDs).  HOME CARE INSTRUCTIONS   Only take over-the-counter or prescription medicines as directed by your caregiver.   If you were given antibiotic medicines, take them as directed. Finish them even if you start to feel better.   Drink enough fluids to keep your urine clear or pale yellow.   Avoid foods and drinks that make your symptoms worse, such as:   Caffeine or alcoholic drinks.   Chocolate.   Peppermint or mint flavorings.   Garlic and onions.   Spicy  foods.   Citrus fruits, such as oranges, lemons, or limes.   Tomato-based foods such as sauce, chili, salsa, and pizza.   Fried and fatty foods.   Eat small, frequent meals instead of large meals.  SEEK IMMEDIATE MEDICAL CARE IF:    You have black or dark red stools.   You vomit blood or material that looks like coffee grounds.   You are unable to keep fluids down.   Your abdominal pain gets worse.   You have a fever.   You do not feel better after 1 week.   You have any other questions or concerns.  MAKE SURE YOU:   Understand these instructions.   Will watch your condition.   Will get help right away if you are not doing well or get worse.  Document Released: 05/30/2001 Document Revised: 12/05/2011 Document Reviewed: 07/19/2011  [employer] Patient Information 2013 [employer].

## 2012-09-29 NOTE — ED Notes (Signed)
Pt. Reports cramping abdominal pain x3 days. N/V/D.

## 2012-09-29 NOTE — ED Notes (Signed)
Pt in CT.

## 2012-10-19 ENCOUNTER — Emergency Department (HOSPITAL_COMMUNITY)
Admission: EM | Admit: 2012-10-19 | Discharge: 2012-10-19 | Disposition: A | Discharge status: Home / Self Care | Payer: Self-pay | Attending: Emergency Medicine | Admitting: Emergency Medicine

## 2012-10-19 ENCOUNTER — Encounter (HOSPITAL_COMMUNITY): Payer: Self-pay | Admitting: Emergency Medicine

## 2012-10-19 DIAGNOSIS — F17200 Nicotine dependence, unspecified, uncomplicated: Secondary | ICD-10-CM | POA: Insufficient documentation

## 2012-10-19 DIAGNOSIS — K2920 Alcoholic gastritis without bleeding: Secondary | ICD-10-CM

## 2012-10-19 LAB — URINALYSIS, ROUTINE W REFLEX MICROSCOPIC
Bilirubin Urine: NEGATIVE
Glucose, UA: NEGATIVE mg/dL
Hgb urine dipstick: NEGATIVE
Ketones, ur: NEGATIVE mg/dL
Leukocytes, UA: NEGATIVE
Nitrite: NEGATIVE
Protein, ur: NEGATIVE mg/dL
Specific Gravity, Urine: 1.02 (ref 1.005–1.030)
Urobilinogen, UA: 0.2 mg/dL (ref 0.0–1.0)
pH: 5.5 (ref 5.0–8.0)

## 2012-10-19 LAB — CBC WITH DIFFERENTIAL/PLATELET
Basophils Absolute: 0 10*3/uL (ref 0.0–0.1)
Basophils Relative: 0 % (ref 0–1)
Eosinophils Absolute: 0.1 10*3/uL (ref 0.0–0.7)
Eosinophils Relative: 1 % (ref 0–5)
HCT: 38.5 % — ABNORMAL LOW (ref 39.0–52.0)
Hemoglobin: 14.2 g/dL (ref 13.0–17.0)
Lymphocytes Relative: 37 % (ref 12–46)
Lymphs Abs: 1.5 10*3/uL (ref 0.7–4.0)
MCH: 31.3 pg (ref 26.0–34.0)
MCHC: 36.9 g/dL — ABNORMAL HIGH (ref 30.0–36.0)
MCV: 85 fL (ref 78.0–100.0)
Monocytes Absolute: 0.3 10*3/uL (ref 0.1–1.0)
Monocytes Relative: 7 % (ref 3–12)
Neutro Abs: 2.3 10*3/uL (ref 1.7–7.7)
Neutrophils Relative %: 55 % (ref 43–77)
Platelets: 136 10*3/uL — ABNORMAL LOW (ref 150–400)
RBC: 4.53 MIL/uL (ref 4.22–5.81)
RDW: 13.2 % (ref 11.5–15.5)
WBC: 4.2 10*3/uL (ref 4.0–10.5)

## 2012-10-19 LAB — COMPREHENSIVE METABOLIC PANEL
ALT: 9 U/L (ref 0–53)
AST: 14 U/L (ref 0–37)
Albumin: 3.5 g/dL (ref 3.5–5.2)
Alkaline Phosphatase: 58 U/L (ref 39–117)
BUN: 14 mg/dL (ref 6–23)
CO2: 24 mEq/L (ref 19–32)
Calcium: 9.2 mg/dL (ref 8.4–10.5)
Chloride: 106 mEq/L (ref 96–112)
Creatinine, Ser: 0.95 mg/dL (ref 0.50–1.35)
GFR calc Af Amer: >90 mL/min (ref >90)
GFR calc non Af Amer: >90 mL/min (ref >90)
Glucose, Bld: 113 mg/dL — ABNORMAL HIGH (ref 70–99)
Potassium: 3.7 mEq/L (ref 3.5–5.1)
Sodium: 139 mEq/L (ref 135–145)
Total Bilirubin: 0.6 mg/dL (ref 0.3–1.2)
Total Protein: 6.6 g/dL (ref 6.0–8.3)

## 2012-10-19 MED ORDER — PANTOPRAZOLE SODIUM 40 MG PO TBEC: 40.0000 mg | DELAYED_RELEASE_TABLET | Freq: Every day | ORAL | Status: DC

## 2012-10-19 MED ORDER — PANTOPRAZOLE SODIUM 40 MG PO TBEC
40.0000 mg | DELAYED_RELEASE_TABLET | Freq: Once | ORAL | Status: AC
  Administered 2012-10-19: 40 mg via ORAL
  Filled 2012-10-19: qty 1

## 2012-10-19 MED ORDER — PANTOPRAZOLE SODIUM 20 MG PO TBEC: 40.0000 mg | DELAYED_RELEASE_TABLET | Freq: Every day | ORAL | Status: DC

## 2012-10-19 MED ORDER — GI COCKTAIL ~~LOC~~
30.0000 mL | Freq: Once | ORAL | Status: AC
  Administered 2012-10-19: 30 mL via ORAL
  Filled 2012-10-19: qty 30

## 2012-10-19 NOTE — ED Provider Notes (Signed)
History     CSN: [id number]  Arrival date & time 10/19/12  7829   First MD Initiated Contact with Patient 10/19/12 1002      Chief Complaint  Patient presents with  . Abdominal Pain    (Consider location/radiation/quality/duration/timing/severity/associated sxs/prior treatment) HPI Pt with long history of abd pain associated with EtOH intake. Pt has had 11 Ct abd/pelivs in the past with no acute findings. Pt has never seen a GI MD. Pt continues to drink alcohol daily and uses ibuprofen. No N/V/D. No blood in stool or melena. Pain is periumbilical. States PPI he was prescribed was helping but that the medicine was lost and pain has worsened.  History reviewed. No pertinent past medical history.  Past Surgical History  Procedure Laterality Date  . Orthopedic surgery      History reviewed. No pertinent family history.  History  Substance Use Topics  . Smoking status: Current Every Day Smoker -- 0.50 packs/day    Types: Cigarettes  . Smokeless tobacco: Not on file  . Alcohol Use: 4.2 oz/week    7 Cans of beer per week     Comment: daily      Review of Systems  Constitutional: Negative for fever and chills.  Respiratory: Negative for shortness of breath.   Cardiovascular: Negative for chest pain.  Gastrointestinal: Positive for abdominal pain. Negative for nausea, vomiting, diarrhea, constipation and blood in stool.  Musculoskeletal: Negative for myalgias and back pain.  Skin: Negative for rash and wound.  Neurological: Negative for dizziness, weakness, light-headedness, numbness and headaches.  All other systems reviewed and are negative.    Allergies  Review of patient's allergies indicates no known allergies.  Home Medications   Current Outpatient Rx  Name  Route  Sig  Dispense  Refill  . acetaminophen (TYLENOL) 500 MG tablet   Oral   Take 500 mg by mouth every 6 (six) hours as needed for pain.         . pantoprazole (PROTONIX) 40 MG tablet   Oral   Take  1 tablet (40 mg total) by mouth daily.   30 tablet   0     BP 116/71  Pulse 52  Temp(Src) 97.6 F (36.4 C) (Oral)  Resp 14  Ht 5\' 9"  (1.753 m)  Wt 198 lb (89.812 kg)  BMI 29.23 kg/m2  SpO2 97%  Physical Exam  Nursing note and vitals reviewed. Constitutional: He is oriented to person, place, and time. He appears well-developed and well-nourished. No distress.  HENT:  Head: Normocephalic and atraumatic.  Mouth/Throat: Oropharynx is clear and moist.  Eyes: EOM are normal. Pupils are equal, round, and reactive to light.  Neck: Normal range of motion. Neck supple.  Cardiovascular: Normal rate and regular rhythm.   Pulmonary/Chest: Effort normal and breath sounds normal. No respiratory distress. He has no wheezes. He has no rales.  Abdominal: Soft. Bowel sounds are normal. He exhibits no distension and no mass. There is tenderness (periumbilcal TTP with mass, rebound or guarding). There is no rebound and no guarding.  Musculoskeletal: Normal range of motion. He exhibits no edema and no tenderness.  Neurological: He is alert and oriented to person, place, and time.  Skin: Skin is warm and dry. No rash noted. No erythema.  Psychiatric: He has a normal mood and affect. His behavior is normal.    ED Course  Procedures (including critical care time)  Labs Reviewed  CBC WITH DIFFERENTIAL - Abnormal; Notable for the following:  HCT 38.5 (*)    MCHC 36.9 (*)    Platelets 136 (*)    All other components within normal limits  COMPREHENSIVE METABOLIC PANEL - Abnormal; Notable for the following:    Glucose, Bld 113 (*)    All other components within normal limits  URINALYSIS, ROUTINE W REFLEX MICROSCOPIC   No results found.   1. Alcoholic gastritis       MDM  Will check labs, treat with Gi cocktail and PPI and d/c to f/u with GI. Pt has been advised to stop taking all NSAID's and wean alcohol use.         Loren Racer, MD 10/20/12 [phone]

## 2012-10-19 NOTE — ED Notes (Signed)
Pt presents to ED today with c/o intermittent abdominal pain and has had it checked in the past but never got the prescription filled.  Pt reports he has a new job starting Monday and wants to "get checked out" before he starts working. NAD

## 2012-12-17 ENCOUNTER — Encounter (HOSPITAL_COMMUNITY): Payer: Self-pay | Admitting: Emergency Medicine

## 2012-12-17 ENCOUNTER — Emergency Department (HOSPITAL_COMMUNITY)
Admission: EM | Admit: 2012-12-17 | Discharge: 2012-12-17 | Disposition: A | Discharge status: Home / Self Care | Payer: Self-pay | Attending: Emergency Medicine | Admitting: Emergency Medicine

## 2012-12-17 DIAGNOSIS — M545 Low back pain, unspecified: Secondary | ICD-10-CM | POA: Insufficient documentation

## 2012-12-17 DIAGNOSIS — F17200 Nicotine dependence, unspecified, uncomplicated: Secondary | ICD-10-CM | POA: Insufficient documentation

## 2012-12-17 DIAGNOSIS — R109 Unspecified abdominal pain: Secondary | ICD-10-CM | POA: Insufficient documentation

## 2012-12-17 LAB — CBC WITH DIFFERENTIAL/PLATELET
Basophils Absolute: 0 10*3/uL (ref 0.0–0.1)
Basophils Relative: 1 % (ref 0–1)
Eosinophils Absolute: 0.1 10*3/uL (ref 0.0–0.7)
Eosinophils Relative: 2 % (ref 0–5)
HCT: 42.8 % (ref 39.0–52.0)
Hemoglobin: 15.1 g/dL (ref 13.0–17.0)
Lymphocytes Relative: 53 % — ABNORMAL HIGH (ref 12–46)
Lymphs Abs: 2.1 10*3/uL (ref 0.7–4.0)
MCH: 30.9 pg (ref 26.0–34.0)
MCHC: 35.3 g/dL (ref 30.0–36.0)
MCV: 87.5 fL (ref 78.0–100.0)
Monocytes Absolute: 0.3 10*3/uL (ref 0.1–1.0)
Monocytes Relative: 8 % (ref 3–12)
Neutro Abs: 1.5 10*3/uL — ABNORMAL LOW (ref 1.7–7.7)
Neutrophils Relative %: 36 % — ABNORMAL LOW (ref 43–77)
Platelets: 149 10*3/uL — ABNORMAL LOW (ref 150–400)
RBC: 4.89 MIL/uL (ref 4.22–5.81)
RDW: 13.4 % (ref 11.5–15.5)
WBC: 4 10*3/uL (ref 4.0–10.5)

## 2012-12-17 LAB — COMPREHENSIVE METABOLIC PANEL
ALT: 8 U/L (ref 0–53)
AST: 13 U/L (ref 0–37)
Albumin: 3.4 g/dL — ABNORMAL LOW (ref 3.5–5.2)
Alkaline Phosphatase: 53 U/L (ref 39–117)
BUN: 10 mg/dL (ref 6–23)
CO2: 22 mEq/L (ref 19–32)
Calcium: 8.9 mg/dL (ref 8.4–10.5)
Chloride: 107 mEq/L (ref 96–112)
Creatinine, Ser: 1 mg/dL (ref 0.50–1.35)
GFR calc Af Amer: >90 mL/min (ref >90)
GFR calc non Af Amer: 85 mL/min — ABNORMAL LOW (ref >90)
Glucose, Bld: 100 mg/dL — ABNORMAL HIGH (ref 70–99)
Potassium: 3.7 mEq/L (ref 3.5–5.1)
Sodium: 139 mEq/L (ref 135–145)
Total Bilirubin: 0.4 mg/dL (ref 0.3–1.2)
Total Protein: 6.6 g/dL (ref 6.0–8.3)

## 2012-12-17 LAB — URINALYSIS, ROUTINE W REFLEX MICROSCOPIC
Bilirubin Urine: NEGATIVE
Glucose, UA: NEGATIVE mg/dL
Hgb urine dipstick: NEGATIVE
Ketones, ur: NEGATIVE mg/dL
Leukocytes, UA: NEGATIVE
Nitrite: NEGATIVE
Protein, ur: NEGATIVE mg/dL
Specific Gravity, Urine: 1.018 (ref 1.005–1.030)
Urobilinogen, UA: 0.2 mg/dL (ref 0.0–1.0)
pH: 5.5 (ref 5.0–8.0)

## 2012-12-17 LAB — LIPASE, BLOOD: Lipase: 46 U/L (ref 11–59)

## 2012-12-17 MED ORDER — KETOROLAC TROMETHAMINE 60 MG/2ML IM SOLN
60.0000 mg | Freq: Once | INTRAMUSCULAR | Status: AC
  Administered 2012-12-17: 60 mg via INTRAMUSCULAR
  Filled 2012-12-17: qty 2

## 2012-12-17 MED ORDER — HYDROCODONE-ACETAMINOPHEN 5-325 MG PO TABS: 2.0000 | ORAL_TABLET | ORAL | Status: DC | PRN

## 2012-12-17 NOTE — ED Notes (Signed)
Pt. Reports lower back pain that radiates around to lower abdomen and down to penis. Pt. Denies penile discharge or urinary difficulty. Pt. Reports hx of STDs and states this feels like his previous STDs.

## 2012-12-17 NOTE — ED Notes (Signed)
PT. REPORTS GENERALIZED ABDOMINAL PAIN FOR SEVERAL DAYS , DENIES NAUSEA / VOMITTING OR DIARRHEA . PT. ALSO REPORTS PENILE PAIN / DENIES DISCHARGE.

## 2012-12-17 NOTE — ED Provider Notes (Signed)
History    CSN: [id number] Arrival date & time 12/17/12  1191  First MD Initiated Contact with Patient 12/17/12 [phone]     Chief Complaint  Patient presents with  . Abdominal Pain   (Consider location/radiation/quality/duration/timing/severity/associated sxs/prior Treatment) HPI Comments: Patient presents with one week history of mid and lower abdominal discomfort, pain in the lower back that started this morning.  No fevers or chills.  No n/v/d.  No dysuria, vomiting, or diarrhea.  From reviewing the chart, it appears as though he has had multiple ct scans and workups into the same over the past several years.  No prior surgeries to the abdomen.  Patient is a 52 y.o. male presenting with abdominal pain. The history is provided by the patient.  Abdominal Pain This is a recurrent problem. Episode onset: one week ago. The problem occurs constantly. The problem has been gradually worsening. Associated symptoms include abdominal pain. The symptoms are aggravated by walking, bending and standing. Nothing relieves the symptoms. He has tried nothing for the symptoms. The treatment provided no relief.   History reviewed. No pertinent past medical history. Past Surgical History  Procedure Laterality Date  . Orthopedic surgery     No family history on file. History  Substance Use Topics  . Smoking status: Current Every Day Smoker -- 0.50 packs/day    Types: Cigarettes  . Smokeless tobacco: Not on file  . Alcohol Use: 4.2 oz/week    7 Cans of beer per week     Comment: daily    Review of Systems  Gastrointestinal: Positive for abdominal pain.  All other systems reviewed and are negative.    Allergies  Review of patient's allergies indicates no known allergies.  Home Medications  No current outpatient prescriptions on file. BP 122/73  Pulse 58  Temp(Src) 98 F (36.7 C) (Oral)  Resp 16  SpO2 99% Physical Exam  Nursing note and vitals reviewed. Constitutional: He is oriented to  person, place, and time. He appears well-developed and well-nourished. No distress.  HENT:  Head: Normocephalic and atraumatic.  Mouth/Throat: Oropharynx is clear and moist.  Neck: Normal range of motion. Neck supple.  Cardiovascular: Normal rate, regular rhythm and normal heart sounds.   No murmur heard. Pulmonary/Chest: Effort normal and breath sounds normal. No respiratory distress. He has no wheezes.  Abdominal: Soft. Bowel sounds are normal. He exhibits no distension. There is tenderness. There is no rebound and no guarding.  There is mild ttp in the lower abdomen and bilateral flanks.  No rebound or guarding.  Bowel sounds are present.    Musculoskeletal: Normal range of motion. He exhibits no edema.  There is ttp in the soft tissues of the lumbar region.  There is no bony ttp or stepoffs.  Lymphadenopathy:    He has no cervical adenopathy.  Neurological: He is alert and oriented to person, place, and time.  Strength is 5/5 in the ble and reflexes are 2+/5 in the ble. and he is ambulatory without difficulty.    Skin: Skin is warm and dry. He is not diaphoretic.    ED Course  Procedures (including critical care time) Labs Reviewed  CBC WITH DIFFERENTIAL - Abnormal; Notable for the following:    Platelets 149 (*)    Neutrophils Relative % 36 (*)    Neutro Abs 1.5 (*)    Lymphocytes Relative 53 (*)    All other components within normal limits  URINALYSIS, ROUTINE W REFLEX MICROSCOPIC  COMPREHENSIVE METABOLIC PANEL  LIPASE,  BLOOD   No results found. No diagnosis found.  MDM  The abdominal exam and workup is unremarkable.  Upon reviewing his record, he has had multiple visits and ct scans for similar presentations.  I doubt there is anything emergent at this point.  He has asked for pain medications which I will provide.  I have also advised him that follow up with a GI doctor may be something that is in his best interest to discuss colonoscopy or endoscopy, or both as no cause  has been identified thus far.  Geoffery Lyons, MD 12/17/12 [phone]

## 2013-02-28 ENCOUNTER — Encounter (HOSPITAL_COMMUNITY): Payer: Self-pay | Admitting: Emergency Medicine

## 2013-02-28 ENCOUNTER — Emergency Department (HOSPITAL_COMMUNITY)
Admission: EM | Admit: 2013-02-28 | Discharge: 2013-02-28 | Disposition: A | Discharge status: Home / Self Care | Payer: Self-pay | Attending: Emergency Medicine | Admitting: Emergency Medicine

## 2013-02-28 DIAGNOSIS — R109 Unspecified abdominal pain: Secondary | ICD-10-CM

## 2013-02-28 DIAGNOSIS — R350 Frequency of micturition: Secondary | ICD-10-CM | POA: Insufficient documentation

## 2013-02-28 DIAGNOSIS — R630 Anorexia: Secondary | ICD-10-CM | POA: Insufficient documentation

## 2013-02-28 DIAGNOSIS — R300 Dysuria: Secondary | ICD-10-CM | POA: Insufficient documentation

## 2013-02-28 DIAGNOSIS — K2920 Alcoholic gastritis without bleeding: Secondary | ICD-10-CM

## 2013-02-28 DIAGNOSIS — F17200 Nicotine dependence, unspecified, uncomplicated: Secondary | ICD-10-CM | POA: Insufficient documentation

## 2013-02-28 DIAGNOSIS — R197 Diarrhea, unspecified: Secondary | ICD-10-CM | POA: Insufficient documentation

## 2013-02-28 LAB — COMPREHENSIVE METABOLIC PANEL
ALT: 9 U/L (ref 0–53)
AST: 14 U/L (ref 0–37)
Albumin: 3.8 g/dL (ref 3.5–5.2)
Alkaline Phosphatase: 48 U/L (ref 39–117)
BUN: 14 mg/dL (ref 6–23)
CO2: 21 mEq/L (ref 19–32)
Calcium: 9 mg/dL (ref 8.4–10.5)
Chloride: 106 mEq/L (ref 96–112)
Creatinine, Ser: 1.05 mg/dL (ref 0.50–1.35)
GFR calc Af Amer: >90 mL/min (ref >90)
GFR calc non Af Amer: 80 mL/min — ABNORMAL LOW (ref >90)
Glucose, Bld: 88 mg/dL (ref 70–99)
Potassium: 3.7 mEq/L (ref 3.5–5.1)
Sodium: 139 mEq/L (ref 135–145)
Total Bilirubin: 0.5 mg/dL (ref 0.3–1.2)
Total Protein: 6.9 g/dL (ref 6.0–8.3)

## 2013-02-28 LAB — CBC WITH DIFFERENTIAL/PLATELET
Basophils Absolute: 0 10*3/uL (ref 0.0–0.1)
Basophils Relative: 1 % (ref 0–1)
Eosinophils Absolute: 0.1 10*3/uL (ref 0.0–0.7)
Eosinophils Relative: 2 % (ref 0–5)
HCT: 41.1 % (ref 39.0–52.0)
Hemoglobin: 14.9 g/dL (ref 13.0–17.0)
Lymphocytes Relative: 49 % — ABNORMAL HIGH (ref 12–46)
Lymphs Abs: 1.8 10*3/uL (ref 0.7–4.0)
MCH: 31.8 pg (ref 26.0–34.0)
MCHC: 36.3 g/dL — ABNORMAL HIGH (ref 30.0–36.0)
MCV: 87.8 fL (ref 78.0–100.0)
Monocytes Absolute: 0.3 10*3/uL (ref 0.1–1.0)
Monocytes Relative: 9 % (ref 3–12)
Neutro Abs: 1.4 10*3/uL — ABNORMAL LOW (ref 1.7–7.7)
Neutrophils Relative %: 40 % — ABNORMAL LOW (ref 43–77)
Platelets: 140 10*3/uL — ABNORMAL LOW (ref 150–400)
RBC: 4.68 MIL/uL (ref 4.22–5.81)
RDW: 13.4 % (ref 11.5–15.5)
WBC: 3.6 10*3/uL — ABNORMAL LOW (ref 4.0–10.5)

## 2013-02-28 LAB — URINALYSIS, DIPSTICK ONLY
Bilirubin Urine: NEGATIVE
Glucose, UA: NEGATIVE mg/dL
Hgb urine dipstick: NEGATIVE
Ketones, ur: NEGATIVE mg/dL
Leukocytes, UA: NEGATIVE
Nitrite: NEGATIVE
Protein, ur: NEGATIVE mg/dL
Specific Gravity, Urine: 1.01 (ref 1.005–1.030)
Urobilinogen, UA: 0.2 mg/dL (ref 0.0–1.0)
pH: 5.5 (ref 5.0–8.0)

## 2013-02-28 LAB — OCCULT BLOOD, POC DEVICE: Fecal Occult Bld: NEGATIVE

## 2013-02-28 LAB — LIPASE, BLOOD: Lipase: 51 U/L (ref 11–59)

## 2013-02-28 MED ORDER — PANTOPRAZOLE SODIUM 40 MG IV SOLR
40.0000 mg | Freq: Once | INTRAVENOUS | Status: AC
  Administered 2013-02-28: 40 mg via INTRAVENOUS
  Filled 2013-02-28: qty 40

## 2013-02-28 MED ORDER — ONDANSETRON HCL 4 MG/2ML IJ SOLN
4.0000 mg | Freq: Once | INTRAMUSCULAR | Status: AC
  Administered 2013-02-28: 4 mg via INTRAVENOUS
  Filled 2013-02-28: qty 2

## 2013-02-28 MED ORDER — ONDANSETRON 4 MG PO TBDP: 4.0000 mg | ORAL_TABLET | Freq: Three times a day (TID) | ORAL | Status: DC | PRN

## 2013-02-28 NOTE — ED Notes (Signed)
Nurse and EMT, Bynum Bellows present, collected fecal sample for Hemoccult.

## 2013-02-28 NOTE — ED Notes (Signed)
Patient is alert and orientedx4.  Patient was explained discharge instructions and they understood them with no questions.   

## 2013-02-28 NOTE — ED Provider Notes (Signed)
CSN: [id number]     Arrival date & time 02/28/13  7829 History   First MD Initiated Contact with Patient 02/28/13 0715     No chief complaint on file.  HPI Comments: Pt is a 52 y/o male with PMHx of significant alcohol abuse with multiple trips to the ED for abdominal pain who presents today with abdominal pain.  Pt states that he has had lower abdominal pain for the past 4-5 days.  Pain has intensified over the past 1-2 days and does not radiate into his back or down his legs.  Describes pain as dull and achy, intermittent, worse with urination.  Pt was able to eat a small meal last night of pork chops, had one beer, and continued to have pain.  Was able to fall asleep, but woke up at 6 AM at which point his wife brought him to the ED.  He does endorse some dysuria/increased frequency, slight chills, nausea, diarrhea but denies fever, hematemesis, vomiting, melena, hematochezia, HA, blurred vision, diplopia, paresthesias, CP, SOB.  He does not endorse any changes in his daily life around 4-5 days ago, denies any sick contacts, any recent travels.  He does drink about 6 beers per night, smokes 1/2 ppd x 40 yrs, and denies illicit drug use, or any recent new unprotected sexual encounters.     The history is provided by the patient.    No past medical history on file. Past Surgical History  Procedure Laterality Date  . Orthopedic surgery     No family history on file. History  Substance Use Topics  . Smoking status: Current Every Day Smoker -- 0.50 packs/day    Types: Cigarettes  . Smokeless tobacco: Not on file  . Alcohol Use: 4.2 oz/week    7 Cans of beer per week     Comment: daily    Review of Systems  Constitutional: Positive for chills, activity change and appetite change. Negative for fever, diaphoresis, fatigue and unexpected weight change.  HENT: Negative for sore throat, neck pain and neck stiffness.   Eyes: Negative for photophobia and pain.  Respiratory: Negative for cough,  chest tightness and shortness of breath.   Cardiovascular: Negative for chest pain, palpitations and leg swelling.  Gastrointestinal: Positive for nausea, abdominal pain (RLQ/LLQ, suprapubic ) and diarrhea. Negative for vomiting, constipation and blood in stool.  Endocrine: Negative.   Genitourinary: Positive for dysuria and frequency. Negative for hematuria, discharge and testicular pain.  Musculoskeletal: Negative.   Skin: Negative.   Allergic/Immunologic: Negative.   Neurological: Negative for dizziness, seizures, syncope, weakness, numbness and headaches.  Hematological: Negative.   Psychiatric/Behavioral: Negative.     Allergies  Review of patient's allergies indicates no known allergies.  Home Medications   Current Outpatient Rx  Name  Route  Sig  Dispense  Refill  . HYDROcodone-acetaminophen (NORCO) 5-325 MG per tablet   Oral   Take 2 tablets by mouth every 4 (four) hours as needed for pain.   15 tablet   0    There were no vitals taken for this visit. Physical Exam  Constitutional: He appears well-developed and well-nourished. No distress.  HENT:  Head: Normocephalic and atraumatic.  Eyes: EOM are normal. Pupils are equal, round, and reactive to light.  Neck: Normal range of motion. Neck supple.  Cardiovascular: Normal rate and regular rhythm.   No murmur heard. Pulmonary/Chest: Effort normal and breath sounds normal.  Abdominal: Soft. Bowel sounds are normal. There is no hepatosplenomegaly. There is tenderness in  the right lower quadrant, suprapubic area and left lower quadrant. There is CVA tenderness (L CVA). There is no rigidity, no rebound, no guarding, no tenderness at McBurney's point and negative Murphy's sign. No hernia. Hernia confirmed negative in the right inguinal area and confirmed negative in the left inguinal area.  Genitourinary: Testes normal and penis normal. Cremasteric reflex is present. Right testis shows no swelling and no tenderness. Left testis  shows no swelling and no tenderness. No discharge found.  Musculoskeletal: Normal range of motion.  Lymphadenopathy:       Right: No inguinal adenopathy present.       Left: No inguinal adenopathy present.  Skin: He is not diaphoretic.    ED Course  Procedures (including critical care time) Labs Review Labs Reviewed  COMPREHENSIVE METABOLIC PANEL  URINALYSIS, DIPSTICK ONLY  LIPASE, BLOOD  CBC WITH DIFFERENTIAL   Imaging Review No results found.  MDM   1. Abdominal pain   2. Alcoholic gastritis   After review of patients chart, and H&P, pt's abdominal pain most likely related to chronic alcohol abuse causing some gastritis.  As well, could be a component of gastroenteritis as he has been having some diarrhea too.  GU exam completely benign and will get UA, CMP, CBC, and Lipase to evaluate for possible UTI, upper urinary tract infection, vs hepatobiliary.  Zofran PRN for nausea and one dose of PPI.    8:24 AM - Lab work grossly normal, will d/c home with limited supply of Zofran ODT.  To f/u with PCP within the next week and advised to limit alcohol use and decrease smoking.  Specific instructions on return to ED given and understood.    Twana First Paulina Fusi, DO of [facility] 02/28/2013, 8:53 AM    Briscoe Deutscher, DO 02/28/13 1326

## 2013-02-28 NOTE — ED Provider Notes (Signed)
I saw and evaluated the patient, reviewed the resident's note and I agree with the findings and plan.  Patient states he is a typical flareup for the last 3-4 days of diffuse abdominal pain worse in the upper abdomen with occasional nausea and vomiting nonbloody no bloody stools no chest pain no shortness breath his abdomen is mild diffuse tenderness with mild to moderate upper abdominal tenderness without rebound has had multiple prior ED evaluations and CT scans for similar symptoms in the past. He still drinks alcohol and smokes tobacco and was told to discontinue both of those habits. Doubt SBI, peritonitis.  Hurman Horn, MD 02/28/13 [phone]

## 2013-02-28 NOTE — ED Notes (Signed)
Patient said about three or four days ago he started having abdominal pain, nausea and vomiting.  The patient said he tried antiacids and they did not work.  He went to work yesterday and felt sick.  Today, he felt worse today so he decided to come to the ED to be evaluated.

## 2013-06-20 ENCOUNTER — Encounter (HOSPITAL_COMMUNITY): Payer: Self-pay | Admitting: Emergency Medicine

## 2013-06-20 ENCOUNTER — Emergency Department (HOSPITAL_COMMUNITY)
Admission: EM | Admit: 2013-06-20 | Discharge: 2013-06-20 | Disposition: A | Discharge status: Home / Self Care | Payer: Self-pay | Attending: Emergency Medicine | Admitting: Emergency Medicine

## 2013-06-20 DIAGNOSIS — R300 Dysuria: Secondary | ICD-10-CM | POA: Insufficient documentation

## 2013-06-20 DIAGNOSIS — R109 Unspecified abdominal pain: Secondary | ICD-10-CM | POA: Insufficient documentation

## 2013-06-20 DIAGNOSIS — F17200 Nicotine dependence, unspecified, uncomplicated: Secondary | ICD-10-CM | POA: Insufficient documentation

## 2013-06-20 DIAGNOSIS — Z202 Contact with and (suspected) exposure to infections with a predominantly sexual mode of transmission: Secondary | ICD-10-CM | POA: Insufficient documentation

## 2013-06-20 DIAGNOSIS — Z711 Person with feared health complaint in whom no diagnosis is made: Secondary | ICD-10-CM

## 2013-06-20 LAB — CBC WITH DIFFERENTIAL/PLATELET
Basophils Absolute: 0 10*3/uL (ref 0.0–0.1)
Basophils Relative: 0 % (ref 0–1)
Eosinophils Absolute: 0 10*3/uL (ref 0.0–0.7)
Eosinophils Relative: 1 % (ref 0–5)
HCT: 41.3 % (ref 39.0–52.0)
Hemoglobin: 14.2 g/dL (ref 13.0–17.0)
Lymphocytes Relative: 39 % (ref 12–46)
Lymphs Abs: 1.4 10*3/uL (ref 0.7–4.0)
MCH: 30.3 pg (ref 26.0–34.0)
MCHC: 34.4 g/dL (ref 30.0–36.0)
MCV: 88.1 fL (ref 78.0–100.0)
Monocytes Absolute: 0.3 10*3/uL (ref 0.1–1.0)
Monocytes Relative: 7 % (ref 3–12)
Neutro Abs: 1.9 10*3/uL (ref 1.7–7.7)
Neutrophils Relative %: 53 % (ref 43–77)
Platelets: 151 10*3/uL (ref 150–400)
RBC: 4.69 MIL/uL (ref 4.22–5.81)
RDW: 13.2 % (ref 11.5–15.5)
WBC: 3.6 10*3/uL — ABNORMAL LOW (ref 4.0–10.5)

## 2013-06-20 LAB — COMPREHENSIVE METABOLIC PANEL
ALT: 10 U/L (ref 0–53)
AST: 13 U/L (ref 0–37)
Albumin: 3.6 g/dL (ref 3.5–5.2)
Alkaline Phosphatase: 57 U/L (ref 39–117)
BUN: 13 mg/dL (ref 6–23)
CO2: 27 mEq/L (ref 19–32)
Calcium: 9.1 mg/dL (ref 8.4–10.5)
Chloride: 104 mEq/L (ref 96–112)
Creatinine, Ser: 0.95 mg/dL (ref 0.50–1.35)
GFR calc Af Amer: >90 mL/min (ref >90)
GFR calc non Af Amer: >90 mL/min (ref >90)
Glucose, Bld: 122 mg/dL — ABNORMAL HIGH (ref 70–99)
Potassium: 4.5 mEq/L (ref 3.7–5.3)
Sodium: 141 mEq/L (ref 137–147)
Total Bilirubin: 0.5 mg/dL (ref 0.3–1.2)
Total Protein: 7.1 g/dL (ref 6.0–8.3)

## 2013-06-20 LAB — URINALYSIS, ROUTINE W REFLEX MICROSCOPIC
Bilirubin Urine: NEGATIVE
Glucose, UA: NEGATIVE mg/dL
Hgb urine dipstick: NEGATIVE
Ketones, ur: NEGATIVE mg/dL
Leukocytes, UA: NEGATIVE
Nitrite: NEGATIVE
Protein, ur: NEGATIVE mg/dL
Specific Gravity, Urine: 1.017 (ref 1.005–1.030)
Urobilinogen, UA: 0.2 mg/dL (ref 0.0–1.0)
pH: 6 (ref 5.0–8.0)

## 2013-06-20 MED ORDER — CEFTRIAXONE SODIUM 250 MG IJ SOLR
250.0000 mg | Freq: Once | INTRAMUSCULAR | Status: AC
  Administered 2013-06-20: 250 mg via INTRAMUSCULAR
  Filled 2013-06-20: qty 250

## 2013-06-20 MED ORDER — AZITHROMYCIN 250 MG PO TABS
1000.0000 mg | ORAL_TABLET | Freq: Once | ORAL | Status: AC
  Administered 2013-06-20: 1000 mg via ORAL
  Filled 2013-06-20: qty 4

## 2013-06-20 MED ORDER — LIDOCAINE HCL (PF) 1 % IJ SOLN
INTRAMUSCULAR | Status: AC
  Administered 2013-06-20: 2 mL
  Filled 2013-06-20: qty 5

## 2013-06-20 NOTE — ED Provider Notes (Signed)
CSN: [id number]     Arrival date & time 06/20/13  0848 History   First MD Initiated Contact with Patient 06/20/13 1039     Chief Complaint  Patient presents with  . Exposure to STD  . Groin Pain   (Consider location/radiation/quality/duration/timing/severity/associated sxs/prior Treatment) HPI  53 year old male presents complaining of mild dysuria and right groin pain. Onset is gradual, intermittent, ongoing for 3 or 4 days. Pain is described as a mild burning while urinating with some sharp shooting pain to low abdomen. No associated fever, chills, chest pain, shortness of breath, nausea vomiting diarrhea back pain, hematuria, penile discharge, scrotal pain, or rash. No prior history of STD. Is sexually active with 2 different partners. He is here concerning of possible STD he requests for treatment.  History reviewed. No pertinent past medical history. Past Surgical History  Procedure Laterality Date  . Orthopedic surgery     No family history on file. History  Substance Use Topics  . Smoking status: Current Every Day Smoker -- 0.50 packs/day    Types: Cigarettes  . Smokeless tobacco: Not on file  . Alcohol Use: 4.2 oz/week    7 Cans of beer per week     Comment: daily    Review of Systems  All other systems reviewed and are negative.    Allergies  Review of patient's allergies indicates no known allergies.  Home Medications  No current outpatient prescriptions on file. BP 126/73  Pulse 59  Temp(Src) 97.6 F (36.4 C) (Oral)  Resp 18  Wt 205 lb (92.987 kg)  SpO2 100% Physical Exam  Constitutional: He appears well-developed and well-nourished. No distress.  HENT:  Head: Atraumatic.  Eyes: Conjunctivae are normal.  Neck: Normal range of motion. Neck supple.  Cardiovascular: Normal rate and regular rhythm.   Pulmonary/Chest: Effort normal and breath sounds normal.  Abdominal: Soft. There is no tenderness.  Genitourinary:  No CVA tenderness  Uncircumcised penis  without discharge, no scrotal tenderness or swelling, no testicular pain, no rash, no penile discharge.  No inguinal hernia.  Neurological: He is alert.  Skin: No rash noted.  Psychiatric: He has a normal mood and affect.    ED Course  Procedures (including critical care time)  Patient in with dysuria, and concerning of STD. UA without evidence of a UTI. Labs are reassuring. No abdominal tenderness on exam, no hernia noted. Since patient concerned for STD I offer penile swab to check for GC and Chlamydia versus given prophylactic treatment. Patient prefers prophylactic treatment only. Will give Rocephin/Zithromax. Patient agrees with plan. Return precautions discussed.   Labs Review Labs Reviewed  CBC WITH DIFFERENTIAL - Abnormal; Notable for the following:    WBC 3.6 (*)    All other components within normal limits  COMPREHENSIVE METABOLIC PANEL - Abnormal; Notable for the following:    Glucose, Bld 122 (*)    All other components within normal limits  URINALYSIS, ROUTINE W REFLEX MICROSCOPIC   Imaging Review No results found.  EKG Interpretation   None       MDM   1. Dysuria   2. Concern about STD in male without diagnosis    BP 126/73  Pulse 59  Temp(Src) 97.6 F (36.4 C) (Oral)  Resp 18  Wt 205 lb (92.987 kg)  SpO2 100%  I have reviewed nursing notes and vital signs. I reviewed available ER/hospitalization records thought the EMR     Fayrene HelperBowie Dulcemaria Bula, [place] 06/20/13 1218

## 2013-06-20 NOTE — ED Notes (Signed)
Pt stated that the onset of symptoms began after sexual intercourse with a partner

## 2013-06-20 NOTE — ED Notes (Signed)
Patient states started having abdominal pain, groin pain (R sided) with urinary symptoms on Tuesday.   Patient denies nausea and vomiting.  Patient complains of headache.

## 2013-06-20 NOTE — Discharge Instructions (Signed)
Sexually Transmitted Disease °Sexually transmitted disease (STD) refers to any infection that is passed from person to person during sexual activity. This may happen by way of saliva, semen, blood, vaginal mucus, or urine. Common STDs include: °· Gonorrhea. °· Chlamydia. °· Syphilis. °· HIV/AIDS. °· Genital herpes. °· Hepatitis B and C. °· Trichomonas. °· Human papillomavirus (HPV). °· Pubic lice. °CAUSES  °An STD may be spread by bacteria, virus, or parasite. A person can get an STD by: °· Sexual intercourse with an infected person. °· Sharing sex toys with an infected person. °· Sharing needles with an infected person. °· Having intimate contact with the genitals, mouth, or rectal areas of an infected person. °SYMPTOMS  °Some people may not have any symptoms, but they can still pass the infection to others. Different STDs have different symptoms. Symptoms include: °· Painful or bloody urination. °· Pain in the pelvis, abdomen, vagina, anus, throat, or eyes. °· Skin rash, itching, irritation, growths, or sores (lesions). These usually occur in the genital or anal area. °· Abnormal vaginal discharge. °· Penile discharge in men. °· Soft, flesh-colored skin growths in the genital or anal area. °· Fever. °· Pain or bleeding during sexual intercourse. °· Swollen glands in the groin area. °· Yellow skin and eyes (jaundice). This is seen with hepatitis. °DIAGNOSIS  °To make a diagnosis, your caregiver may: °· Take a medical history. °· Perform a physical exam. °· Take a specimen (culture) to be examined. °· Examine a sample of discharge under a microscope. °· Perform blood tests. °· Perform a Pap test, if this applies. °· Perform a colposcopy. °· Perform a laparoscopy. °TREATMENT  °· Chlamydia, gonorrhea, trichomonas, and syphilis can be cured with antibiotic medicine. °· Genital herpes, hepatitis, and HIV can be treated, but not cured, with prescribed medicines. The medicines will lessen the symptoms. °· Genital warts  from HPV can be treated with medicine or by freezing, burning (electrocautery), or surgery. Warts may come back. °· HPV is a virus and cannot be cured with medicine or surgery. However, abnormal areas may be followed very closely by your caregiver and may be removed from the cervix, vagina, or vulva through office procedures or surgery. °If your diagnosis is confirmed, your recent sexual partners need treatment. This is true even if they are symptom-free or have a negative culture or evaluation. They should not have sex until their caregiver says it is okay. °HOME CARE INSTRUCTIONS °· All sexual partners should be informed, tested, and treated for all STDs. °· Take your antibiotics as directed. Finish them even if you start to feel better. °· Only take over-the-counter or prescription medicines for pain, discomfort, or fever as directed by your caregiver. °· Rest. °· Eat a balanced diet and drink enough fluids to keep your urine clear or pale yellow. °· Do not have sex until treatment is completed and you have followed up with your caregiver. STDs should be checked after treatment. °· Keep all follow-up appointments, Pap tests, and blood tests as directed by your caregiver. °· Only use latex condoms and water-soluble lubricants during sexual activity. Do not use petroleum jelly or oils. °· Avoid alcohol and illegal drugs. °· Get vaccinated for HPV and hepatitis. If you have not received these vaccines in the past, talk to your caregiver about whether one or both might be right for you. °· Avoid risky sex practices that can break the skin. °The only way to avoid getting an STD is to avoid all sexual activity. Latex condoms and dental   dams (for oral sex) will help lessen the risk of getting an STD, but will not completely eliminate the risk. °SEEK MEDICAL CARE IF:  °· You have a fever. °· You have any new or worsening symptoms. °Document Released: 08/26/2002 Document Revised: 08/28/2011 Document Reviewed:  12/24/2012 °[employer]® Patient Information ©2014 [employer]. ° °

## 2013-06-20 NOTE — ED Notes (Signed)
Care transferred, report received Lori, RN. 

## 2013-06-23 NOTE — ED Provider Notes (Signed)
Medical screening examination/treatment/procedure(s) were performed by non-physician practitioner and as supervising physician I was immediately available for consultation/collaboration.  EKG Interpretation   None         Umair Rosiles W. Eilah Common, MD 06/23/13 [id number] 

## 2013-07-07 ENCOUNTER — Encounter (HOSPITAL_COMMUNITY): Payer: Self-pay | Admitting: Emergency Medicine

## 2013-07-07 ENCOUNTER — Emergency Department (HOSPITAL_COMMUNITY)
Admission: EM | Admit: 2013-07-07 | Discharge: 2013-07-07 | Disposition: A | Discharge status: Home / Self Care | Payer: Self-pay | Attending: Emergency Medicine | Admitting: Emergency Medicine

## 2013-07-07 DIAGNOSIS — R300 Dysuria: Secondary | ICD-10-CM | POA: Diagnosis present

## 2013-07-07 DIAGNOSIS — F17200 Nicotine dependence, unspecified, uncomplicated: Secondary | ICD-10-CM | POA: Insufficient documentation

## 2013-07-07 DIAGNOSIS — Z202 Contact with and (suspected) exposure to infections with a predominantly sexual mode of transmission: Secondary | ICD-10-CM | POA: Diagnosis present

## 2013-07-07 DIAGNOSIS — R1032 Left lower quadrant pain: Secondary | ICD-10-CM | POA: Diagnosis present

## 2013-07-07 LAB — URINALYSIS, ROUTINE W REFLEX MICROSCOPIC
Bilirubin Urine: NEGATIVE
Glucose, UA: NEGATIVE mg/dL
Hgb urine dipstick: NEGATIVE
Ketones, ur: NEGATIVE mg/dL
Leukocytes, UA: NEGATIVE
Nitrite: NEGATIVE
Protein, ur: NEGATIVE mg/dL
Specific Gravity, Urine: 1.01 (ref 1.005–1.030)
Urobilinogen, UA: 0.2 mg/dL (ref 0.0–1.0)
pH: 6 (ref 5.0–8.0)

## 2013-07-07 MED ORDER — AZITHROMYCIN 250 MG PO TABS
1000.0000 mg | ORAL_TABLET | Freq: Once | ORAL | Status: AC
  Administered 2013-07-07: 1000 mg via ORAL
  Filled 2013-07-07: qty 4

## 2013-07-07 MED ORDER — METRONIDAZOLE 500 MG PO TABS
2000.0000 mg | ORAL_TABLET | ORAL | Status: AC
  Administered 2013-07-07: 2000 mg via ORAL
  Filled 2013-07-07: qty 4

## 2013-07-07 MED ORDER — LIDOCAINE HCL (PF) 1 % IJ SOLN
INTRAMUSCULAR | Status: AC
  Administered 2013-07-07: 2 mL
  Filled 2013-07-07: qty 5

## 2013-07-07 MED ORDER — CEFTRIAXONE SODIUM 250 MG IJ SOLR
250.0000 mg | Freq: Once | INTRAMUSCULAR | Status: AC
  Administered 2013-07-07: 250 mg via INTRAMUSCULAR
  Filled 2013-07-07: qty 250

## 2013-07-07 NOTE — Discharge Instructions (Signed)
Abdominal Pain, Adult °Many things can cause abdominal pain. Usually, abdominal pain is not caused by a disease and will improve without treatment. It can often be observed and treated at home. Your health care provider will do a physical exam and possibly order blood tests and X-rays to help determine the seriousness of your pain. However, in many cases, more time must pass before a clear cause of the pain can be found. Before that point, your health care provider may not know if you need more testing or further treatment. °HOME CARE INSTRUCTIONS  °Monitor your abdominal pain for any changes. The following actions may help to alleviate any discomfort you are experiencing: °· Only take over-the-counter or prescription medicines as directed by your health care provider. °· Do not take laxatives unless directed to do so by your health care provider. °· Try a clear liquid diet (broth, tea, or water) as directed by your health care provider. Slowly move to a bland diet as tolerated. °SEEK MEDICAL CARE IF: °· You have unexplained abdominal pain. °· You have abdominal pain associated with nausea or diarrhea. °· You have pain when you urinate or have a bowel movement. °· You experience abdominal pain that wakes you in the night. °· You have abdominal pain that is worsened or improved by eating food. °· You have abdominal pain that is worsened with eating fatty foods. °SEEK IMMEDIATE MEDICAL CARE IF:  °· Your pain does not go away within 2 hours. °· You have a fever. °· You keep throwing up (vomiting). °· Your pain is felt only in portions of the abdomen, such as the right side or the left lower portion of the abdomen. °· You pass bloody or black tarry stools. °MAKE SURE YOU: °· Understand these instructions.   °· Will watch your condition.   °· Will get help right away if you are not doing well or get worse.   °Document Released: 03/15/2005 Document Revised: 03/26/2013 Document Reviewed: 02/12/2013 °ExitCare® Patient  Information ©2014 [employer]. ° °

## 2013-07-07 NOTE — ED Provider Notes (Signed)
CSN: [id number]     Arrival date & time 07/07/13  0900 History   First MD Initiated Contact with Patient 07/07/13 0912     Chief Complaint  Patient presents with  . Abdominal Pain  . Flank Pain   (Consider location/radiation/quality/duration/timing/severity/associated sxs/prior Treatment) Patient is a 53 y.o. male presenting with abdominal pain and flank pain. The history is provided by the patient.  Abdominal Pain Pain location: left groin and LLQ. Pain quality: aching   Pain radiates to:  Does not radiate Pain severity:  Mild Onset quality:  Gradual Duration:  3 days Timing:  Constant Progression:  Unchanged Chronicity:  Recurrent Context: recent sexual activity   Relieved by:  Nothing Worsened by:  Nothing tried Ineffective treatments:  None tried Associated symptoms: dysuria   Associated symptoms: no chest pain, no cough, no diarrhea, no fever, no hematuria, no nausea, no shortness of breath and no vomiting   Flank Pain Associated symptoms include abdominal pain. Pertinent negatives include no chest pain, no headaches and no shortness of breath.    History reviewed. No pertinent past medical history. Past Surgical History  Procedure Laterality Date  . Orthopedic surgery     No family history on file. History  Substance Use Topics  . Smoking status: Current Every Day Smoker -- 0.50 packs/day    Types: Cigarettes  . Smokeless tobacco: Not on file  . Alcohol Use: 4.2 oz/week    7 Cans of beer per week     Comment: daily    Review of Systems  Constitutional: Negative for fever.  HENT: Negative for drooling and rhinorrhea.   Eyes: Negative for pain.  Respiratory: Negative for cough and shortness of breath.   Cardiovascular: Negative for chest pain and leg swelling.  Gastrointestinal: Positive for abdominal pain. Negative for nausea, vomiting and diarrhea.  Genitourinary: Positive for dysuria and flank pain. Negative for hematuria.  Musculoskeletal: Negative for  gait problem and neck pain.  Skin: Negative for color change.  Neurological: Negative for numbness and headaches.  Hematological: Negative for adenopathy.  Psychiatric/Behavioral: Negative for behavioral problems.  All other systems reviewed and are negative.    Allergies  Review of patient's allergies indicates no known allergies.  Home Medications  No current outpatient prescriptions on file. BP 140/71  Pulse 71  Temp(Src) 97.9 F (36.6 C) (Oral)  Resp 18  SpO2 98% Physical Exam  Nursing note and vitals reviewed. Constitutional: He is oriented to person, place, and time. He appears well-developed and well-nourished.  HENT:  Head: Normocephalic and atraumatic.  Right Ear: External ear normal.  Left Ear: External ear normal.  Nose: Nose normal.  Mouth/Throat: Oropharynx is clear and moist. No oropharyngeal exudate.  Eyes: Conjunctivae and EOM are normal. Pupils are equal, round, and reactive to light.  Neck: Normal range of motion. Neck supple.  Cardiovascular: Normal rate, regular rhythm, normal heart sounds and intact distal pulses.  Exam reveals no gallop and no friction rub.   No murmur heard. Pulmonary/Chest: Effort normal and breath sounds normal. No respiratory distress. He has no wheezes.  Abdominal: Soft. Bowel sounds are normal. He exhibits no distension. There is tenderness (mild ttp of left groin and left lower quadrant). There is no rebound and no guarding.  Musculoskeletal: Normal range of motion. He exhibits no edema and no tenderness.  Neurological: He is alert and oriented to person, place, and time.  Skin: Skin is warm and dry.  Psychiatric: He has a normal mood and affect. His behavior is  normal.    ED Course  Procedures (including critical care time) Labs Review Labs Reviewed  URINALYSIS, ROUTINE W REFLEX MICROSCOPIC   Imaging Review No results found.  EKG Interpretation   None       MDM   1. LLQ pain   2. Possible exposure to STD   3.  Dysuria    9:44 AM 53 y.o. male who presents with concern for STD exposure. The patient states that he has been having mild left groin pain and dysuria for the last 3 days. He was seen here on January 2 for similar symptoms and had noncontributory labwork, urinalysis, and genital exam at that time. He was empirically treated for STDs. He presents now stating that he had unprotected sex with the same person and is concerned that he again contracted and STD. I offered lab work and a genital exam, but he declined. I think this is reasonable as he had resolution of his symptoms with the antibiotics last time. He is afebrile and his vital signs are unremarkable here. Will get screening urinalysis and treat empirically for STDs.  10:53 AM: UA non-contrib. Pt continues to appear well. Will rec he tells his partner to be evaluated and treated prior to sexual encounter. Will have him return if sx persist. I have discussed the diagnosis/risks/treatment options with the patient and believe the pt to be eligible for discharge home to follow-up with pcp as needed. We also discussed returning to the ED immediately if new or worsening sx occur. We discussed the sx which are most concerning (e.g., continued abd pain, continued dysuria, fever) that necessitate immediate return. Any new prescriptions provided to the patient are listed below.  New Prescriptions   No medications on file     Junius ArgyleForrest S Terius Jacuinde, MD 07/07/13 1059

## 2013-07-07 NOTE — ED Notes (Signed)
Rt flank pain and abd pain x 3 days no n/v/d

## 2013-08-13 ENCOUNTER — Emergency Department (HOSPITAL_COMMUNITY)
Admission: EM | Admit: 2013-08-13 | Discharge: 2013-08-13 | Disposition: A | Discharge status: Home / Self Care | Payer: Self-pay | Attending: Emergency Medicine | Admitting: Emergency Medicine

## 2013-08-13 ENCOUNTER — Encounter (HOSPITAL_COMMUNITY): Payer: Self-pay | Admitting: Emergency Medicine

## 2013-08-13 DIAGNOSIS — R51 Headache: Secondary | ICD-10-CM | POA: Insufficient documentation

## 2013-08-13 DIAGNOSIS — R300 Dysuria: Secondary | ICD-10-CM | POA: Insufficient documentation

## 2013-08-13 DIAGNOSIS — F17200 Nicotine dependence, unspecified, uncomplicated: Secondary | ICD-10-CM | POA: Insufficient documentation

## 2013-08-13 DIAGNOSIS — R1032 Left lower quadrant pain: Secondary | ICD-10-CM | POA: Insufficient documentation

## 2013-08-13 DIAGNOSIS — R1031 Right lower quadrant pain: Secondary | ICD-10-CM | POA: Insufficient documentation

## 2013-08-13 DIAGNOSIS — Z202 Contact with and (suspected) exposure to infections with a predominantly sexual mode of transmission: Secondary | ICD-10-CM | POA: Insufficient documentation

## 2013-08-13 LAB — URINALYSIS, ROUTINE W REFLEX MICROSCOPIC
Bilirubin Urine: NEGATIVE
Glucose, UA: NEGATIVE mg/dL
Hgb urine dipstick: NEGATIVE
Ketones, ur: NEGATIVE mg/dL
Leukocytes, UA: NEGATIVE
Nitrite: NEGATIVE
Protein, ur: NEGATIVE mg/dL
Specific Gravity, Urine: 1.017 (ref 1.005–1.030)
Urobilinogen, UA: 1 mg/dL (ref 0.0–1.0)
pH: 6 (ref 5.0–8.0)

## 2013-08-13 LAB — RAPID HIV SCREEN (WH-MAU): Rapid HIV Screen: NONREACTIVE

## 2013-08-13 LAB — RPR: RPR Ser Ql: NONREACTIVE

## 2013-08-13 MED ORDER — LIDOCAINE HCL (PF) 1 % IJ SOLN
INTRAMUSCULAR | Status: AC
  Filled 2013-08-13: qty 5

## 2013-08-13 MED ORDER — AZITHROMYCIN 250 MG PO TABS
1000.0000 mg | ORAL_TABLET | Freq: Once | ORAL | Status: AC
  Administered 2013-08-13: 1000 mg via ORAL
  Filled 2013-08-13: qty 4

## 2013-08-13 MED ORDER — IBUPROFEN 400 MG PO TABS
400.0000 mg | ORAL_TABLET | Freq: Once | ORAL | Status: AC
  Administered 2013-08-13: 400 mg via ORAL
  Filled 2013-08-13: qty 1

## 2013-08-13 MED ORDER — PROCHLORPERAZINE MALEATE 10 MG PO TABS
10.0000 mg | ORAL_TABLET | Freq: Once | ORAL | Status: AC
  Administered 2013-08-13: 10 mg via ORAL
  Filled 2013-08-13: qty 1

## 2013-08-13 MED ORDER — CEFTRIAXONE SODIUM 250 MG IJ SOLR
250.0000 mg | Freq: Once | INTRAMUSCULAR | Status: AC
  Administered 2013-08-13: 250 mg via INTRAMUSCULAR
  Filled 2013-08-13: qty 250

## 2013-08-13 NOTE — Discharge Instructions (Signed)
Dysuria °Dysuria is the medical term for pain with urination. There are many causes for dysuria, but urinary tract infection is the most common. If a urinalysis was performed it can show that there is a urinary tract infection. A urine culture confirms that you or your child is sick. You will need to follow up with a healthcare provider because: °· If a urine culture was done you will need to know the culture results and treatment recommendations. °· If the urine culture was positive, you or your child will need to be put on antibiotics or know if the antibiotics prescribed are the right antibiotics for your urinary tract infection. °· If the urine culture is negative (no urinary tract infection), then other causes may need to be explored or antibiotics need to be stopped. °Today laboratory work may have been done and there does not seem to be an infection. If cultures were done they will take at least 24 to 48 hours to be completed. °Today x-rays may have been taken and they read as normal. No cause can be found for the problems. The x-rays may be re-read by a radiologist and you will be contacted if additional findings are made. °You or your child may have been put on medications to help with this problem until you can see your primary caregiver. If the problems get better, see your primary caregiver if the problems return. If you were given antibiotics (medications which kill germs), take all of the mediations as directed for the full course of treatment.  °If laboratory work was done, you need to find the results. Leave a telephone number where you can be reached. If this is not possible, make sure you find out how you are to get test results. °HOME CARE INSTRUCTIONS  °· Drink lots of fluids. For adults, drink eight, 8 ounce glasses of clear juice or water a day. For children, replace fluids as suggested by your caregiver. °· Empty the bladder often. Avoid holding urine for long periods of time. °· After a bowel  movement, women should cleanse front to back, using each tissue only once. °· Empty your bladder before and after sexual intercourse. °· Take all the medicine given to you until it is gone. You may feel better in a few days, but TAKE ALL MEDICINE. °· Avoid caffeine, tea, alcohol and carbonated beverages, because they tend to irritate the bladder. °· In men, alcohol may irritate the prostate. °· Only take over-the-counter or prescription medicines for pain, discomfort, or fever as directed by your caregiver. °· If your caregiver has given you a follow-up appointment, it is very important to keep that appointment. Not keeping the appointment could result in a chronic or permanent injury, pain, and disability. If there is any problem keeping the appointment, you must call back to this facility for assistance. °SEEK IMMEDIATE MEDICAL CARE IF:  °· Back pain develops. °· A fever develops. °· There is nausea (feeling sick to your stomach) or vomiting (throwing up). °· Problems are no better with medications or are getting worse. °MAKE SURE YOU:  °· Understand these instructions. °· Will watch your condition. °· Will get help right away if you are not doing well or get worse. °Document Released: 03/03/2004 Document Revised: 08/28/2011 Document Reviewed: 01/09/2008 °ExitCare® Patient Information ©2014 [employer]. ° ° ° °Emergency Department Resource Guide °1) Find a Doctor and Pay Out of Pocket °Although you won't have to find out who is covered by your insurance plan, it is a   good idea to ask around and get recommendations. You will then need to call the office and see if the doctor you have chosen will accept you as a new patient and what types of options they offer for patients who are self-pay. Some doctors offer discounts or will set up payment plans for their patients who do not have insurance, but you will need to ask so you aren't surprised when you get to your appointment. ° °2) Contact Your Local Health  Department °Not all health departments have doctors that can see patients for sick visits, but many do, so it is worth a call to see if yours does. If you don't know where your local health department is, you can check in your phone book. The CDC also has a tool to help you locate your state's health department, and many state websites also have listings of all of their local health departments. ° °3) Find a Walk-in Clinic °If your illness is not likely to be very severe or complicated, you may want to try a walk in clinic. These are popping up all over the country in pharmacies, drugstores, and shopping centers. They're usually staffed by nurse practitioners or physician assistants that have been trained to treat common illnesses and complaints. They're usually fairly quick and inexpensive. However, if you have serious medical issues or chronic medical problems, these are probably not your best option. ° °No Primary Care Doctor: °- Call [facility] at  832-8000 - they can help you locate a primary care doctor that  accepts your insurance, provides certain services, etc. °- Physician Referral Service- 1-800-533-3463 ° °Chronic Pain Problems: °Organization         Address  Phone   Notes  °[facility]  (336) 297-2271 Patients need to be referred by their primary care doctor.  ° °Medication Assistance: °Organization         Address  Phone   Notes  °[place] [facility] [address] °[place], [place] [address] (336) 641-8030 --Must be a resident of [place] °-- Must have NO insurance coverage whatsoever (no Medicaid/ Medicare, etc.) °-- The pt. MUST have a primary care doctor that directs their care regularly and follows them in the community °  °[facility]  (866) 331-1348   °[employer]  (888) 892-1162   ° °Agencies that provide inexpensive medical care: °Organization         Address  Phone   Notes  °[facility]  (336) 832-8035   °[facility]    (336) 832-7272   °[facility] [address] °[place], [place] [address] (336) 832-4777   °[facility] [address], °[place] (336) 271-4999   °[facility]    (336) 373-0678   °[facility]    (336) 272-1050   °[facility] ° [address], [place] Phone:  (336) 832-4444, Fax:  (336) 832-4440 Hours of Operation:  9 am - 6 pm, M-F.  Also accepts Medicaid/Medicare and self-pay.  °[facility] ° [address],  Phone: (336) 832-3150, Fax: (336) 832-3151. Hours of Operation:  8:30 am - 5:30 pm, M-F.  Also accepts Medicaid and self-pay.  °[facility] [place] [address], [place] Phone: (336) 878-6027   °[facility] [address], [place], [place] (336)723-1848, Ext. 123 Mondays & Thursdays: 7-9 AM.  First   15 patients are seen on a first come, first serve basis. °  ° °Medicaid-accepting [place] Providers: ° °Organization         Address  Phone   Notes  °[facility] [address], [place] (336) 641-2100 Also accepts self-pay patients.  °[facility] [address], [place] ° (336) 856-9996   °[facility] [address], [place] (336) 288-8857   °[facility] [address] [place] [address], [place] (336) 299-7000   °Veita Bland [address], [place]  ° (336) 373-1557 Only accepts [place] Access Medicaid patients after they have their name applied to their card.  ° °Self-Pay (no insurance) in [place]: ° °Organization         Address  Phone   Notes  °Sickle Cell Patients, [facility] [address], [place] (336) 832-1970   °[facility] [address], [place] (336) 832-4400   °[facility] ° [address],  [place] (336) 992-4800   °[facility]/Dr. Osei-Bonsu ° [address] [place] [address], [place] or [address], [place] (336) 841-8500 Phone number for both [place] and [place] locations is the same.  °[facility] [address], [place] (336) 299-0000   °[facility] [address] [place] [address], [place] or [address] (336) 852-7530 °(336) 878-2260   °[facility] [address], [place] (336) 350-1642, phone; (336) 294-5005, fax Sees patients 1st and 3rd Saturday of every month.  Must not qualify for public or private insurance (i.e. Medicaid, Medicare, [place] Health Choice, Veterans' Benefits) • Household income should be no more than 200% of the poverty level •The clinic cannot treat you if you are pregnant or think you are pregnant • Sexually transmitted diseases are not treated at the clinic.  ° ° °Dental Care: °Organization         Address  Phone  Notes  °[facility] [address], [place] (336) 641-6152 Accepts children up to age 21 who are enrolled in Medicaid or [place] Health Choice; pregnant women with a Medicaid card; and children who have applied for Medicaid or [place] Health Choice, but were declined, whose parents can pay a reduced fee at time of service.  °[facility] [place]  [address], [place] (336) 641-7733 Accepts children up to age 21 who are enrolled in Medicaid or [place] Health Choice; pregnant women with a Medicaid card; and children who have applied for Medicaid or  Health Choice, but were declined, whose parents can pay a reduced fee at time of service.  °[facility] ° [address], [place] (336) 641-4533 Patients are seen by appointment only. Walk-ins are not accepted. [facility] will see patients 18 years of age and older. °Monday - Tuesday (8am-5pm) °Most Wednesdays  (8:30-5pm) °$30 per visit, cash only  °[facility] ° [address], [place] (336) 641-4533 Patients are seen by appointment only. Walk-ins are not accepted. [facility] will see patients 18 years of age and older. °One Wednesday Evening (Monthly: Volunteer Based).  $30 per visit, cash only  °[facility]  (919) 537-3737 for adults; Children under age 4, call Graduate Pediatric   Dentistry at ([phone]) [phone]. Children aged 4-14, please call (919) [phone] to request a pediatric application. ° Dental services are provided in all areas of dental care including fillings, crowns and bridges, complete and partial dentures, implants, gum treatment, root canals, and extractions. Preventive care is also provided. Treatment is provided to both adults and children. °Patients are selected via a lottery and there is often a waiting list. °  °[facility] [address], °[place] ° (336) 763-8833 [email or web address] °  °[facility] [address], [place], [place] (336)723-1848, Ext. 123 Second and Fourth Thursday of each month, opens at 6:30 AM; Clinic ends at 9 AM.  Patients are seen on a first-come first-served basis, and a limited number are seen during each clinic.  ° °[facility] ° [address], [place], [place] (336) 723-7904   Eligibility Requirements °You must have lived in [place], [place], or [place] counties for at least the last three months. °  You cannot be eligible for state or federal sponsored healthcare insurance, including Veterans Administration, Medicaid, or Medicare. °  You generally cannot be eligible for healthcare insurance through your employer.  °  How to apply: °Eligibility screenings are held every Tuesday and Wednesday afternoon from 1:00 pm until 4:00 pm. You do not need an appointment for the interview!  °[facility] [address], [place], [place] 336-631-2330   °[place]  [facility]  336-342-8273   °[place] [facility]  336-703-3100   °[facility]  336-570-6415   ° °Behavioral Health Resources in the Community: °Intensive Outpatient Programs °Organization         Address  Phone  Notes  °[facility] [address], [place], [place] 336-878-6098   °[facility] [address], [place], [place] 336-832-9800   °[facility] [address], [place], [place] ° 336-882-2125   °[facility] [address],  °[place], [place] 1-800-853-5163 or 336-641-4981   °Substance Abuse Resources °Organization         Address  Phone  Notes  °[facility]  336-882-2125   °[facility]  336-784-9470   °[facility]  336-285-9073   °[facility]  336-845-3988   °Residential & Outpatient Substance Abuse Program  1-800-659-3381   °Psychological Services °Organization         Address  Phone  Notes  °[facility]  336- 832-9600   °[facility]  336- 378-7881   °[facility] [address], [place] 1-800-853-5163 or 336-641-4981   ° °Mobile Crisis Teams °Organization         Address  Phone  Notes  °[facility], Mobile Crisis Care Unit  1-877-626-1772   °[facility] °[facility] ° [address] [place], [place] 336-834-9664   °Sharon DeEsch [address] °[place] 336-554-5454   ° °Self-Help/Support Groups °Organization         Address  Phone             Notes  °[facility] - variety of support groups  336- 373-1402 Call for more information  °Narcotics Anonymous (NA), [facility] [address], °[place]  2 meetings at this location  ° °Residential Treatment Programs °Organization         Address  Phone  Notes  °[facility] [address],    °[place]  1-866-801-8205   °[facility] ° [address], [place], [place]  704-293-8524   °[facility] [address], [place] 336-845-3988 Admissions: 8am-3pm M-F  °[facility] [address]    °[place], [place] 336-841-1104   °[facility] [address], [place], [place] 336-379-7146   °[facility] [address]  °[place], [place] 336-285-9073   °[facility] Intensive Outpatient [address], [place], [place] 336-852-3033   °[facility] [address]  °[place], [place] 1-877-615-2722 or 336-784-9470   °[facility] [address] [place], [place] 336-227-7417 Accepts Medicaid  °[facility] [address]  °[place] 1-800-659-3381 Substance Abuse/Addiction Treatment  ° °[place] Behavioral Health Resources °Organization         Address  Phone  Notes  °[facility]  (888) 581-9988   °Julie Brannon, PhD [address] [place], [place]   (336) 349-5553 or (336) 951-0000   °[facility]   [address] °[place], [place] (336) 349-4454   °[facility] [address], [place], [place] (336) 342-8316 Insurance/Medicaid/sponsorship through [facility]  °[facility] [address] [place] [address]                                    [place], [place] (336) 342-8316 Therapy/tele-psych/case  °[facility] [address]  ° [place], [place] (336) 349-2233    °Dr. Arfeen  (336) 349-4544   °[facility] [place]  [employer] [place] [facility] 1) [address], [place] °2) [address], [place] °3)  [address], [place] (336) 349-3220 °(336) 342-7768 ° °(336) 342-8140   °[place] [facility] (336) 342-1394 or (336) 342-3537 (After Hours)    ° ° ° ° ° ° ° °

## 2013-08-13 NOTE — ED Notes (Signed)
Pt undressed and in gown 

## 2013-08-13 NOTE — ED Provider Notes (Signed)
Medical screening examination/treatment/procedure(s) were performed by non-physician practitioner and as supervising physician I was immediately available for consultation/collaboration.  EKG Interpretation   None        Toy BakerAnthony T Koen Antilla, MD 08/13/13 [phone]

## 2013-08-13 NOTE — ED Provider Notes (Signed)
CSN: [id number]     Arrival date & time 08/13/13  [id number] History   First MD Initiated Contact with Patient 08/13/13 1000     Chief Complaint  Patient presents with  . Exposure to STD     (Consider location/radiation/quality/duration/timing/severity/associated sxs/prior Treatment) HPI Comments: Patient states he 53 year old male who presents today after exposure to STD. He reports that for the past 4 days he's been having lower abdominal pain and dysuria. This has happened in the past when he has had STDs. He is unsure which STDs he has had. He had a recent encounter with "his lady friend". He denies any penile pain or discharge. She denies any fevers, chills, nausea, vomiting, diarrhea. He has been having gradually worsening headaches over the past 4 days. The headaches generally improved with Tylenol. He denies any visual changes, photophobia, numbness, weakness, paresthesias.  Patient is a 53 y.o. male presenting with STD exposure. The history is provided by the patient. No language interpreter was used.  Exposure to STD Associated symptoms include abdominal pain and headaches. Pertinent negatives include no chest pain, chills, fever, nausea, numbness, vomiting or weakness.    History reviewed. No pertinent past medical history. Past Surgical History  Procedure Laterality Date  . Orthopedic surgery     History reviewed. No pertinent family history. History  Substance Use Topics  . Smoking status: Current Every Day Smoker -- 0.50 packs/day    Types: Cigarettes  . Smokeless tobacco: Not on file  . Alcohol Use: 4.2 oz/week    7 Cans of beer per week     Comment: daily    Review of Systems  Constitutional: Negative for fever and chills.  Eyes: Negative for photophobia and visual disturbance.  Respiratory: Negative for shortness of breath.   Cardiovascular: Negative for chest pain.  Gastrointestinal: Positive for abdominal pain. Negative for nausea, vomiting and diarrhea.   Genitourinary: Positive for dysuria. Negative for discharge, penile swelling, penile pain and testicular pain.  Neurological: Positive for headaches. Negative for weakness and numbness.  All other systems reviewed and are negative.      Allergies  Review of patient's allergies indicates no known allergies.  Home Medications  No current outpatient prescriptions on file. BP 135/85  Pulse 60  Temp(Src) 97 F (36.1 C)  Resp 18  SpO2 98% Physical Exam  Nursing note and vitals reviewed. Constitutional: He is oriented to person, place, and time. He appears well-developed and well-nourished. He does not appear ill. No distress.  HENT:  Head: Normocephalic and atraumatic.  Right Ear: External ear normal.  Left Ear: External ear normal.  Nose: Nose normal.  No temporal artery tenderness  Eyes: Conjunctivae and EOM are normal. Pupils are equal, round, and reactive to light.  Neck: Normal range of motion. No tracheal deviation present.  No nuchal rigidity or meningeal signs  Cardiovascular: Normal rate, regular rhythm, normal heart sounds, intact distal pulses and normal pulses.   Pulmonary/Chest: Effort normal and breath sounds normal. No stridor.  Abdominal: Soft. Bowel sounds are normal. He exhibits no distension. There is tenderness in the right lower quadrant, suprapubic area and left lower quadrant. There is no rigidity, no rebound and no guarding.  Mild tenderness to deep palpation  Genitourinary: Testes normal. Right testis shows no mass, no swelling and no tenderness. Right testis is descended. Left testis shows no mass, no swelling and no tenderness. Left testis is descended. Uncircumcised. No phimosis, paraphimosis, hypospadias, penile erythema or penile tenderness. No discharge found.  Musculoskeletal: Normal  range of motion.  Neurological: He is alert and oriented to person, place, and time. Coordination and gait normal.  Finger nose finger normal, no pronator drift. Grip  strength 5/5 bilaterally.  Skin: Skin is warm and dry. He is not diaphoretic.  Psychiatric: He has a normal mood and affect. His behavior is normal.    ED Course  Procedures (including critical care time) Labs Review Labs Reviewed  GC/CHLAMYDIA PROBE AMP  URINALYSIS, ROUTINE W REFLEX MICROSCOPIC  RAPID HIV SCREEN ([facility])  RPR   Imaging Review No results found.  EKG Interpretation   None       MDM   Final diagnoses:  Possible exposure to STD  Dysuria    Patient is an otherwise healthy 53 year old male who presents today for dysuria and possible STD exposure. Urine shows no infection. Rapid HIV screen was done which is negative. I prophylactically treated the patient with azithromycin and Rocephin. I discussed with the patient that he will get a call if the GC/Chlamydia and RPR are positive. I discussed reasons to return to the emergency department immediately. Discussed followup at the health Department. Pt HA treated and improved while in ED.  Patient also with HA non concerning for [facility], ICH, Meningitis, or temporal arteritis. Pt is afebrile with no focal neuro deficits, nuchal rigidity, or change in vision. Pt is to follow up with PCP to discuss prophylactic medication. Pt verbalizes understanding and is agreeable with plan to dc.  Vital signs stable for discharge.Patient / Family / Caregiver informed of clinical course, understand medical decision-making process, and agree with plan.   Mora Bellman, PA-C 08/13/13 1416  Mora Bellman, PA-C 08/13/13 [phone]

## 2013-08-13 NOTE — ED Notes (Addendum)
Per pt sts he thinks he has been exposed to STD. sts he met up with a lady friend that he hadn't seen in years. sts lower abdominal discomfort and pain with urination. Denies N,V,D. Denies penile discharge. sts also having HA and feels like his legs are going to give out.

## 2013-08-14 LAB — GC/CHLAMYDIA PROBE AMP
CT Probe RNA: NEGATIVE
GC Probe RNA: NEGATIVE

## 2013-08-24 ENCOUNTER — Emergency Department (HOSPITAL_COMMUNITY)
Admission: EM | Admit: 2013-08-24 | Discharge: 2013-08-24 | Disposition: A | Discharge status: Home / Self Care | Payer: Self-pay | Attending: Emergency Medicine | Admitting: Emergency Medicine

## 2013-08-24 ENCOUNTER — Encounter (HOSPITAL_COMMUNITY): Payer: Self-pay | Admitting: Emergency Medicine

## 2013-08-24 DIAGNOSIS — F17200 Nicotine dependence, unspecified, uncomplicated: Secondary | ICD-10-CM | POA: Insufficient documentation

## 2013-08-24 DIAGNOSIS — R197 Diarrhea, unspecified: Secondary | ICD-10-CM | POA: Insufficient documentation

## 2013-08-24 DIAGNOSIS — R300 Dysuria: Secondary | ICD-10-CM | POA: Insufficient documentation

## 2013-08-24 DIAGNOSIS — K219 Gastro-esophageal reflux disease without esophagitis: Secondary | ICD-10-CM | POA: Insufficient documentation

## 2013-08-24 DIAGNOSIS — R109 Unspecified abdominal pain: Secondary | ICD-10-CM

## 2013-08-24 DIAGNOSIS — R350 Frequency of micturition: Secondary | ICD-10-CM | POA: Insufficient documentation

## 2013-08-24 LAB — COMPREHENSIVE METABOLIC PANEL
ALT: 9 U/L (ref 0–53)
AST: 17 U/L (ref 0–37)
Albumin: 3.6 g/dL (ref 3.5–5.2)
Alkaline Phosphatase: 54 U/L (ref 39–117)
BUN: 11 mg/dL (ref 6–23)
CO2: 24 mEq/L (ref 19–32)
Calcium: 9.1 mg/dL (ref 8.4–10.5)
Chloride: 107 mEq/L (ref 96–112)
Creatinine, Ser: 1 mg/dL (ref 0.50–1.35)
GFR calc Af Amer: >90 mL/min (ref >90)
GFR calc non Af Amer: 85 mL/min — ABNORMAL LOW (ref >90)
Glucose, Bld: 96 mg/dL (ref 70–99)
Potassium: 4.1 mEq/L (ref 3.7–5.3)
Sodium: 144 mEq/L (ref 137–147)
Total Bilirubin: 0.4 mg/dL (ref 0.3–1.2)
Total Protein: 7 g/dL (ref 6.0–8.3)

## 2013-08-24 LAB — URINALYSIS, ROUTINE W REFLEX MICROSCOPIC
Bilirubin Urine: NEGATIVE
Glucose, UA: NEGATIVE mg/dL
Hgb urine dipstick: NEGATIVE
Ketones, ur: NEGATIVE mg/dL
Leukocytes, UA: NEGATIVE
Nitrite: NEGATIVE
Protein, ur: NEGATIVE mg/dL
Specific Gravity, Urine: 1.019 (ref 1.005–1.030)
Urobilinogen, UA: 0.2 mg/dL (ref 0.0–1.0)
pH: 5.5 (ref 5.0–8.0)

## 2013-08-24 LAB — CBC WITH DIFFERENTIAL/PLATELET
Basophils Absolute: 0 10*3/uL (ref 0.0–0.1)
Basophils Relative: 0 % (ref 0–1)
Eosinophils Absolute: 0.1 10*3/uL (ref 0.0–0.7)
Eosinophils Relative: 2 % (ref 0–5)
HCT: 43.1 % (ref 39.0–52.0)
Hemoglobin: 15.5 g/dL (ref 13.0–17.0)
Lymphocytes Relative: 50 % — ABNORMAL HIGH (ref 12–46)
Lymphs Abs: 2.3 10*3/uL (ref 0.7–4.0)
MCH: 31.6 pg (ref 26.0–34.0)
MCHC: 36 g/dL (ref 30.0–36.0)
MCV: 88 fL (ref 78.0–100.0)
Monocytes Absolute: 0.5 10*3/uL (ref 0.1–1.0)
Monocytes Relative: 10 % (ref 3–12)
Neutro Abs: 1.7 10*3/uL (ref 1.7–7.7)
Neutrophils Relative %: 37 % — ABNORMAL LOW (ref 43–77)
Platelets: 138 10*3/uL — ABNORMAL LOW (ref 150–400)
RBC: 4.9 MIL/uL (ref 4.22–5.81)
RDW: 13.2 % (ref 11.5–15.5)
WBC: 4.5 10*3/uL (ref 4.0–10.5)

## 2013-08-24 LAB — LIPASE, BLOOD: Lipase: 89 U/L — ABNORMAL HIGH (ref 11–59)

## 2013-08-24 MED ORDER — OXYCODONE-ACETAMINOPHEN 5-325 MG PO TABS
1.0000 | ORAL_TABLET | ORAL | Status: DC | PRN
Start: 2013-08-24 — End: 2013-08-27

## 2013-08-24 MED ORDER — METRONIDAZOLE 500 MG PO TABS
2000.0000 mg | ORAL_TABLET | ORAL | Status: AC
  Administered 2013-08-24: 2000 mg via ORAL
  Filled 2013-08-24: qty 4

## 2013-08-24 MED ORDER — CEFTRIAXONE SODIUM 250 MG IJ SOLR
250.0000 mg | Freq: Once | INTRAMUSCULAR | Status: AC
  Administered 2013-08-24: 250 mg via INTRAMUSCULAR
  Filled 2013-08-24: qty 250

## 2013-08-24 MED ORDER — ACETAMINOPHEN 325 MG PO TABS
650.0000 mg | ORAL_TABLET | Freq: Once | ORAL | Status: AC
  Administered 2013-08-24: 650 mg via ORAL
  Filled 2013-08-24: qty 2

## 2013-08-24 MED ORDER — OMEPRAZOLE 20 MG PO CPDR: 20.0000 mg | DELAYED_RELEASE_CAPSULE | Freq: Every day | ORAL | Status: DC

## 2013-08-24 MED ORDER — AZITHROMYCIN 250 MG PO TABS
1000.0000 mg | ORAL_TABLET | Freq: Once | ORAL | Status: AC
  Administered 2013-08-24: 1000 mg via ORAL
  Filled 2013-08-24: qty 4

## 2013-08-24 MED ORDER — GI COCKTAIL ~~LOC~~
30.0000 mL | Freq: Once | ORAL | Status: AC
Start: 2013-08-24 — End: 2013-08-24
  Administered 2013-08-24: 30 mL via ORAL
  Filled 2013-08-24: qty 30

## 2013-08-24 MED ORDER — LIDOCAINE HCL (PF) 1 % IJ SOLN
INTRAMUSCULAR | Status: AC
  Administered 2013-08-24: 1 mL via INTRAMUSCULAR
  Filled 2013-08-24: qty 5

## 2013-08-24 NOTE — ED Notes (Signed)
Pt c/o bilateral flank pain X 3 days along with acid reflux, sts he is burping a lot. Pt also c/o urinary frequency, denies dysuria. Pt reports when he has a BM he get pain in his rectum X 4 days, denies bleeding in stool/on toilet paper. Nad, skin warm and dry, resp e/u.

## 2013-08-24 NOTE — Discharge Instructions (Signed)
Abdominal Pain, Adult °Many things can cause abdominal pain. Usually, abdominal pain is not caused by a disease and will improve without treatment. It can often be observed and treated at home. Your health care provider will do a physical exam and possibly order blood tests and X-rays to help determine the seriousness of your pain. However, in many cases, more time must pass before a clear cause of the pain can be found. Before that point, your health care provider may not know if you need more testing or further treatment. °HOME CARE INSTRUCTIONS  °Monitor your abdominal pain for any changes. The following actions may help to alleviate any discomfort you are experiencing: °· Only take over-the-counter or prescription medicines as directed by your health care provider. °· Do not take laxatives unless directed to do so by your health care provider. °· Try a clear liquid diet (broth, tea, or water) as directed by your health care provider. Slowly move to a bland diet as tolerated. °SEEK MEDICAL CARE IF: °· You have unexplained abdominal pain. °· You have abdominal pain associated with nausea or diarrhea. °· You have pain when you urinate or have a bowel movement. °· You experience abdominal pain that wakes you in the night. °· You have abdominal pain that is worsened or improved by eating food. °· You have abdominal pain that is worsened with eating fatty foods. °SEEK IMMEDIATE MEDICAL CARE IF:  °· Your pain does not go away within 2 hours. °· You have a fever. °· You keep throwing up (vomiting). °· Your pain is felt only in portions of the abdomen, such as the right side or the left lower portion of the abdomen. °· You pass bloody or black tarry stools. °MAKE SURE YOU: °· Understand these instructions.   °· Will watch your condition.   °· Will get help right away if you are not doing well or get worse.   °Document Released: 03/15/2005 Document Revised: 03/26/2013 Document Reviewed: 02/12/2013 °ExitCare® Patient  Information ©2014 [employer]. ° °

## 2013-08-24 NOTE — ED Provider Notes (Signed)
CSN: [id number]     Arrival date & time 08/24/13  [id number] History   First MD Initiated Contact with Patient 08/24/13 [phone]     Chief Complaint  Patient presents with  . Gastrophageal Reflux  . Flank Pain     (Consider location/radiation/quality/duration/timing/severity/associated sxs/prior Treatment) Patient is a 53 y.o. male presenting with GERD and flank pain. The history is provided by the patient.  Gastrophageal Reflux This is a new problem. The current episode started more than 1 week ago. The problem occurs constantly. The problem has not changed since onset.Associated symptoms include abdominal pain. Pertinent negatives include no chest pain, no headaches and no shortness of breath. Nothing aggravates the symptoms. Nothing relieves the symptoms. He has tried nothing for the symptoms. The treatment provided no relief.  Flank Pain This is a new problem. Episode onset: 2 days ago. The problem occurs constantly. The problem has not changed since onset.Associated symptoms include abdominal pain. Pertinent negatives include no chest pain, no headaches and no shortness of breath. Exacerbated by: urination. Nothing relieves the symptoms. He has tried nothing for the symptoms. The treatment provided no relief.    History reviewed. No pertinent past medical history. Past Surgical History  Procedure Laterality Date  . Orthopedic surgery     No family history on file. History  Substance Use Topics  . Smoking status: Current Every Day Smoker -- 0.50 packs/day    Types: Cigarettes  . Smokeless tobacco: Not on file  . Alcohol Use: 4.2 oz/week    7 Cans of beer per week     Comment: daily    Review of Systems  Constitutional: Negative for fever.  HENT: Negative for drooling and rhinorrhea.   Eyes: Negative for pain.  Respiratory: Negative for cough and shortness of breath.   Cardiovascular: Negative for chest pain and leg swelling.  Gastrointestinal: Positive for abdominal pain and  diarrhea. Negative for nausea and vomiting.  Genitourinary: Positive for dysuria, frequency and flank pain. Negative for hematuria.  Musculoskeletal: Negative for gait problem and neck pain.  Skin: Negative for color change.  Neurological: Negative for numbness and headaches.  Hematological: Negative for adenopathy.  Psychiatric/Behavioral: Negative for behavioral problems.  All other systems reviewed and are negative.      Allergies  Review of patient's allergies indicates no known allergies.  Home Medications  No current outpatient prescriptions on file. BP 131/80  Pulse 65  Temp(Src) 97.5 F (36.4 C) (Oral)  SpO2 99% Physical Exam  Nursing note and vitals reviewed. Constitutional: He is oriented to person, place, and time. He appears well-developed and well-nourished.  HENT:  Head: Normocephalic and atraumatic.  Right Ear: External ear normal.  Left Ear: External ear normal.  Nose: Nose normal.  Mouth/Throat: Oropharynx is clear and moist. No oropharyngeal exudate.  Eyes: Conjunctivae and EOM are normal. Pupils are equal, round, and reactive to light.  Neck: Normal range of motion. Neck supple.  Cardiovascular: Normal rate, regular rhythm, normal heart sounds and intact distal pulses.  Exam reveals no gallop and no friction rub.   No murmur heard. Pulmonary/Chest: Effort normal and breath sounds normal. No respiratory distress. He has no wheezes.  Abdominal: Soft. Bowel sounds are normal. He exhibits no distension. There is tenderness (very mild bilateral flank ttp). There is no rebound and no guarding.  Musculoskeletal: Normal range of motion. He exhibits no edema and no tenderness.  Neurological: He is alert and oriented to person, place, and time.  Skin: Skin is warm and dry.  Psychiatric: He has a normal mood and affect. His behavior is normal.    ED Course  Procedures (including critical care time) Labs Review Labs Reviewed  CBC WITH DIFFERENTIAL - Abnormal;  Notable for the following:    Platelets 138 (*)    Neutrophils Relative % 37 (*)    Lymphocytes Relative 50 (*)    All other components within normal limits  COMPREHENSIVE METABOLIC PANEL - Abnormal; Notable for the following:    GFR calc non Af Amer 85 (*)    All other components within normal limits  LIPASE, BLOOD - Abnormal; Notable for the following:    Lipase 89 (*)    All other components within normal limits  URINALYSIS, ROUTINE W REFLEX MICROSCOPIC   Imaging Review No results found.   EKG Interpretation None      MDM   Final diagnoses:  GERD (gastroesophageal reflux disease)  Flank pain  Dysuria    8:13 AM 53 y.o. male who been seen here many times in the past for abdominal pain who presents with abdominal pain and concern for STD exposure. I saw the patient fairly recently with a similar story that he had had unprotected sex with an old acquaintance. I empirically treated him for STDs at that time. He has been seen many times in the past for nonspecific abdominal pain and had a negative CT scan in 2014. He notes mild bilateral flank pain radiating to his groin, frequency, and dysuria. He denies any penile discharge, fevers, vomiting. He has had some mild diarrhea as well as some reflux-type symptoms. He is afebrile and vital signs are unremarkable here. He prefers empiric treatment for STDs and would like to defer the genitourinary exam. Will get screening labwork, treat empirically for STDs. Pt had non-reactive HIV last visit. Doubt serious cause of abd pain.   9:40 AM: Patient feeling better after Tylenol and GI cocktail. He is found to have a mildly elevated lipase but labs are otherwise noncontributory. His abdomen remains benign. He does drink a sixpack of beer daily which is likely the cause of his mildly elevated lipase. He requested pain medicine for home I informed him that I would provide him 5 tablets of Percocet. Will also start him on a PPI. I have discussed the  diagnosis/risks/treatment options with the patient and believe the pt to be eligible for discharge home to follow-up with and establish w/ a pcp. We also discussed returning to the ED immediately if new or worsening sx occur. We discussed the sx which are most concerning (e.g., worsening abd pain, fever, continued dysuria, vomiting, worsening diarrhea) that necessitate immediate return. Medications administered to the patient during their visit and any new prescriptions provided to the patient are listed below.  Medications given during this visit Medications  metroNIDAZOLE (FLAGYL) tablet 2,000 mg (2,000 mg Oral Given 08/24/13 0821)  cefTRIAXone (ROCEPHIN) injection 250 mg (250 mg Intramuscular Given 08/24/13 0826)  azithromycin (ZITHROMAX) tablet 1,000 mg (1,000 mg Oral Given 08/24/13 0820)  gi cocktail (Maalox,Lidocaine,Donnatal) (30 mLs Oral Given 08/24/13 0821)  acetaminophen (TYLENOL) tablet 650 mg (650 mg Oral Given 08/24/13 0820)  lidocaine (PF) (XYLOCAINE) 1 % injection (1 mL Intramuscular Given 08/24/13 0828)    New Prescriptions   OMEPRAZOLE (PRILOSEC) 20 MG CAPSULE    Take 1 capsule (20 mg total) by mouth daily.   OXYCODONE-ACETAMINOPHEN (PERCOCET) 5-325 MG PER TABLET    Take 1 tablet by mouth every 4 (four) hours as needed.  Junius ArgyleForrest S Silvina Hackleman, MD 08/24/13 [phone]

## 2013-08-27 ENCOUNTER — Encounter (HOSPITAL_COMMUNITY): Payer: Self-pay | Admitting: Emergency Medicine

## 2013-08-27 ENCOUNTER — Emergency Department (HOSPITAL_COMMUNITY)
Admission: EM | Admit: 2013-08-27 | Discharge: 2013-08-27 | Disposition: A | Discharge status: Home / Self Care | Payer: Self-pay | Attending: Emergency Medicine | Admitting: Emergency Medicine

## 2013-08-27 DIAGNOSIS — R109 Unspecified abdominal pain: Secondary | ICD-10-CM | POA: Insufficient documentation

## 2013-08-27 DIAGNOSIS — Z79899 Other long term (current) drug therapy: Secondary | ICD-10-CM | POA: Insufficient documentation

## 2013-08-27 DIAGNOSIS — R300 Dysuria: Secondary | ICD-10-CM | POA: Insufficient documentation

## 2013-08-27 DIAGNOSIS — F17200 Nicotine dependence, unspecified, uncomplicated: Secondary | ICD-10-CM | POA: Insufficient documentation

## 2013-08-27 DIAGNOSIS — M549 Dorsalgia, unspecified: Secondary | ICD-10-CM | POA: Insufficient documentation

## 2013-08-27 HISTORY — DX: Other chronic pain: G89.29

## 2013-08-27 LAB — URINALYSIS, ROUTINE W REFLEX MICROSCOPIC
Bilirubin Urine: NEGATIVE
Glucose, UA: NEGATIVE mg/dL
Hgb urine dipstick: NEGATIVE
Ketones, ur: NEGATIVE mg/dL
Leukocytes, UA: NEGATIVE
Nitrite: NEGATIVE
Protein, ur: NEGATIVE mg/dL
Specific Gravity, Urine: 1.022 (ref 1.005–1.030)
Urobilinogen, UA: 0.2 mg/dL (ref 0.0–1.0)
pH: 6 (ref 5.0–8.0)

## 2013-08-27 MED ORDER — OXYCODONE-ACETAMINOPHEN 5-325 MG PO TABS
2.0000 | ORAL_TABLET | Freq: Once | ORAL | Status: AC
  Administered 2013-08-27: 2 via ORAL
  Filled 2013-08-27: qty 2

## 2013-08-27 NOTE — ED Notes (Signed)
Patient reports he was recently here Sunday for back pain that woke him up from sleep.  He reports no recent falls.  His prescription on discharge was Percocet 5-325mg , but he did not get it filled because he didn't have any money. Patient is alert and oriented.

## 2013-08-27 NOTE — Discharge Instructions (Signed)
Back Pain, Adult Low back pain is very common. About 1 in 5 people have back pain.The cause of low back pain is rarely dangerous. The pain often gets better over time.About half of people with a sudden onset of back pain feel better in just 2 weeks. About 8 in 10 people feel better by 6 weeks.  CAUSES Some common causes of back pain include:  Strain of the muscles or ligaments supporting the spine.  Wear and tear (degeneration) of the spinal discs.  Arthritis.  Direct injury to the back. DIAGNOSIS Most of the time, the direct cause of low back pain is not known.However, back pain can be treated effectively even when the exact cause of the pain is unknown.Answering your caregiver's questions about your overall health and symptoms is one of the most accurate ways to make sure the cause of your pain is not dangerous. If your caregiver needs more information, he or she may order lab work or imaging tests (X-rays or MRIs).However, even if imaging tests show changes in your back, this usually does not require surgery. HOME CARE INSTRUCTIONS For many people, back pain returns.Since low back pain is rarely dangerous, it is often a condition that people can learn to manageon their own.   Remain active. It is stressful on the back to sit or stand in one place. Do not sit, drive, or stand in one place for more than 30 minutes at a time. Take short walks on level surfaces as soon as pain allows.Try to increase the length of time you walk each day.  Do not stay in bed.Resting more than 1 or 2 days can delay your recovery.  Do not avoid exercise or work.Your body is made to move.It is not dangerous to be active, even though your back may hurt.Your back will likely heal faster if you return to being active before your pain is gone.  Pay attention to your body when you bend and lift. Many people have less discomfortwhen lifting if they bend their knees, keep the load close to their bodies,and  avoid twisting. Often, the most comfortable positions are those that put less stress on your recovering back.  Find a comfortable position to sleep. Use a firm mattress and lie on your side with your knees slightly bent. If you lie on your back, put a pillow under your knees.  Only take over-the-counter or prescription medicines as directed by your caregiver. Over-the-counter medicines to reduce pain and inflammation are often the most helpful.Your caregiver may prescribe muscle relaxant drugs.These medicines help dull your pain so you can more quickly return to your normal activities and healthy exercise.  Put ice on the injured area.  Put ice in a plastic bag.  Place a towel between your skin and the bag.  Leave the ice on for 15-20 minutes, 03-04 times a day for the first 2 to 3 days. After that, ice and heat may be alternated to reduce pain and spasms.  Ask your caregiver about trying back exercises and gentle massage. This may be of some benefit.  Avoid feeling anxious or stressed.Stress increases muscle tension and can worsen back pain.It is important to recognize when you are anxious or stressed and learn ways to manage it.Exercise is a great option. SEEK MEDICAL CARE IF:  You have pain that is not relieved with rest or medicine.  You have pain that does not improve in 1 week.  You have new symptoms.  You are generally not feeling well. SEEK   IMMEDIATE MEDICAL CARE IF:   You have pain that radiates from your back into your legs.  You develop new bowel or bladder control problems.  You have unusual weakness or numbness in your arms or legs.  You develop nausea or vomiting.  You develop abdominal pain.  You feel faint. Document Released: 06/05/2005 Document Revised: 12/05/2011 Document Reviewed: 10/24/2010 [employer] Patient Information 2014 [employer].  

## 2013-08-27 NOTE — ED Notes (Signed)
Discussed need to void for sample.  Patient acknowledges.

## 2013-08-27 NOTE — ED Provider Notes (Signed)
Medical screening examination/treatment/procedure(s) were performed by non-physician practitioner and as supervising physician I was immediately available for consultation/collaboration.  Flint MelterElliott L Dolan Xia, MD 08/27/13 [phone]

## 2013-08-27 NOTE — ED Provider Notes (Signed)
CSN: [id number]     Arrival date & time 08/27/13  [id number] History   First MD Initiated Contact with Patient 08/27/13 [phone]     Chief Complaint  Patient presents with  . Back Pain     (Consider location/radiation/quality/duration/timing/severity/associated sxs/prior Treatment) HPI Comments: Patient presents to the ED with a chief complaint of flank pain.  He states that this is not a new problem.  He was seen here 3 days ago for the same but was unable to get his medications.  He states that the pain is constant.  He denies any aggravating or alleviating factors.  He endorses associated dysuria.  He was treated for STI 3 days ago.  He denies any fevers, chills, nausea, vomiting, or diarrhea.    The history is provided by the patient. No language interpreter was used.    No past medical history on file. Past Surgical History  Procedure Laterality Date  . Orthopedic surgery     No family history on file. History  Substance Use Topics  . Smoking status: Current Every Day Smoker -- 0.50 packs/day    Types: Cigarettes  . Smokeless tobacco: Not on file  . Alcohol Use: 4.2 oz/week    7 Cans of beer per week     Comment: daily    Review of Systems  All other systems reviewed and are negative.      Allergies  Review of patient's allergies indicates no known allergies.  Home Medications   Current Outpatient Rx  Name  Route  Sig  Dispense  Refill  . omeprazole (PRILOSEC) 20 MG capsule   Oral   Take 1 capsule (20 mg total) by mouth daily.   30 capsule   1   . oxyCODONE-acetaminophen (PERCOCET) 5-325 MG per tablet   Oral   Take 1 tablet by mouth every 4 (four) hours as needed.   5 tablet   0    BP 112/77  Pulse 61  Temp(Src) 97.6 F (36.4 C) (Oral)  Resp 18  Ht 5\' 9"  (1.753 m)  Wt 198 lb (89.812 kg)  BMI 29.23 kg/m2  SpO2 97% Physical Exam  Nursing note and vitals reviewed. Constitutional: He is oriented to person, place, and time. He appears well-developed and  well-nourished.  HENT:  Head: Normocephalic and atraumatic.  Eyes: Conjunctivae and EOM are normal. Pupils are equal, round, and reactive to light. Right eye exhibits no discharge. Left eye exhibits no discharge. No scleral icterus.  Neck: Normal range of motion. Neck supple. No JVD present.  Cardiovascular: Normal rate, regular rhythm and normal heart sounds.  Exam reveals no gallop and no friction rub.   No murmur heard. Pulmonary/Chest: Effort normal and breath sounds normal. No respiratory distress. He has no wheezes. He has no rales. He exhibits no tenderness.  Abdominal: Soft. He exhibits no distension and no mass. There is no tenderness. There is no rebound and no guarding.  No focal abdominal tenderness, no RLQ tenderness or pain at McBurney's point, no RUQ tenderness or Murphy's sign, no left-sided abdominal tenderness, no fluid wave, or signs of peritonitis Bilateral very mild CVAT   Musculoskeletal: Normal range of motion. He exhibits no edema and no tenderness.  Neurological: He is alert and oriented to person, place, and time.  Skin: Skin is warm and dry.  Psychiatric: He has a normal mood and affect. His behavior is normal. Judgment and thought content normal.    ED Course  Procedures (including critical care time) Results for orders  placed during the hospital encounter of 08/27/13  URINALYSIS, ROUTINE W REFLEX MICROSCOPIC      Result Value Ref Range   Color, Urine YELLOW  YELLOW   APPearance CLEAR  CLEAR   Specific Gravity, Urine 1.022  1.005 - 1.030   pH 6.0  5.0 - 8.0   Glucose, UA NEGATIVE  NEGATIVE mg/dL   Hgb urine dipstick NEGATIVE  NEGATIVE   Bilirubin Urine NEGATIVE  NEGATIVE   Ketones, ur NEGATIVE  NEGATIVE mg/dL   Protein, ur NEGATIVE  NEGATIVE mg/dL   Urobilinogen, UA 0.2  0.0 - 1.0 mg/dL   Nitrite NEGATIVE  NEGATIVE   Leukocytes, UA NEGATIVE  NEGATIVE   No results found.   No results found.   EKG Interpretation None      MDM   Final  diagnoses:  Back pain   Patient seen recently for the same complaint.  Unable to get his medications.  Still complains of dysuria.  Afebrile, VSS.  I will check a UA.    7:59 AM UA is negative.  I will discharge to home with PCP follow-up.  Advised the patient to get his medications filled.  Patient is not in any apparent distress.  He is stable and ready for discharge.    Roxy Horseman, PA-C 08/27/13 0800

## 2013-09-28 ENCOUNTER — Emergency Department (HOSPITAL_COMMUNITY)
Admission: EM | Admit: 2013-09-28 | Discharge: 2013-09-28 | Disposition: A | Discharge status: Home / Self Care | Payer: Self-pay | Attending: Emergency Medicine | Admitting: Emergency Medicine

## 2013-09-28 ENCOUNTER — Encounter (HOSPITAL_COMMUNITY): Payer: Self-pay | Admitting: Emergency Medicine

## 2013-09-28 DIAGNOSIS — R110 Nausea: Secondary | ICD-10-CM | POA: Insufficient documentation

## 2013-09-28 DIAGNOSIS — R1012 Left upper quadrant pain: Secondary | ICD-10-CM | POA: Insufficient documentation

## 2013-09-28 DIAGNOSIS — F17200 Nicotine dependence, unspecified, uncomplicated: Secondary | ICD-10-CM | POA: Insufficient documentation

## 2013-09-28 DIAGNOSIS — R197 Diarrhea, unspecified: Secondary | ICD-10-CM | POA: Insufficient documentation

## 2013-09-28 DIAGNOSIS — R300 Dysuria: Secondary | ICD-10-CM | POA: Insufficient documentation

## 2013-09-28 DIAGNOSIS — R079 Chest pain, unspecified: Secondary | ICD-10-CM | POA: Insufficient documentation

## 2013-09-28 DIAGNOSIS — R109 Unspecified abdominal pain: Secondary | ICD-10-CM

## 2013-09-28 LAB — CBC WITH DIFFERENTIAL/PLATELET
Basophils Absolute: 0 10*3/uL (ref 0.0–0.1)
Basophils Relative: 1 % (ref 0–1)
Eosinophils Absolute: 0.1 10*3/uL (ref 0.0–0.7)
Eosinophils Relative: 3 % (ref 0–5)
HCT: 41.1 % (ref 39.0–52.0)
Hemoglobin: 14.5 g/dL (ref 13.0–17.0)
Lymphocytes Relative: 50 % — ABNORMAL HIGH (ref 12–46)
Lymphs Abs: 1.7 10*3/uL (ref 0.7–4.0)
MCH: 31.4 pg (ref 26.0–34.0)
MCHC: 35.3 g/dL (ref 30.0–36.0)
MCV: 89 fL (ref 78.0–100.0)
Monocytes Absolute: 0.3 10*3/uL (ref 0.1–1.0)
Monocytes Relative: 10 % (ref 3–12)
Neutro Abs: 1.2 10*3/uL — ABNORMAL LOW (ref 1.7–7.7)
Neutrophils Relative %: 36 % — ABNORMAL LOW (ref 43–77)
Platelets: 139 10*3/uL — ABNORMAL LOW (ref 150–400)
RBC: 4.62 MIL/uL (ref 4.22–5.81)
RDW: 13.4 % (ref 11.5–15.5)
WBC: 3.3 10*3/uL — ABNORMAL LOW (ref 4.0–10.5)

## 2013-09-28 LAB — COMPREHENSIVE METABOLIC PANEL
ALT: 8 U/L (ref 0–53)
AST: 15 U/L (ref 0–37)
Albumin: 3.7 g/dL (ref 3.5–5.2)
Alkaline Phosphatase: 52 U/L (ref 39–117)
BUN: 12 mg/dL (ref 6–23)
CO2: 22 mEq/L (ref 19–32)
Calcium: 9 mg/dL (ref 8.4–10.5)
Chloride: 104 mEq/L (ref 96–112)
Creatinine, Ser: 0.99 mg/dL (ref 0.50–1.35)
GFR calc Af Amer: >90 mL/min (ref >90)
GFR calc non Af Amer: >90 mL/min (ref >90)
Glucose, Bld: 98 mg/dL (ref 70–99)
Potassium: 4 mEq/L (ref 3.7–5.3)
Sodium: 141 mEq/L (ref 137–147)
Total Bilirubin: 0.5 mg/dL (ref 0.3–1.2)
Total Protein: 6.8 g/dL (ref 6.0–8.3)

## 2013-09-28 LAB — URINALYSIS, ROUTINE W REFLEX MICROSCOPIC
Bilirubin Urine: NEGATIVE
Glucose, UA: NEGATIVE mg/dL
Hgb urine dipstick: NEGATIVE
Ketones, ur: NEGATIVE mg/dL
Leukocytes, UA: NEGATIVE
Nitrite: NEGATIVE
Protein, ur: NEGATIVE mg/dL
Specific Gravity, Urine: 1.024 (ref 1.005–1.030)
Urobilinogen, UA: 0.2 mg/dL (ref 0.0–1.0)
pH: 5.5 (ref 5.0–8.0)

## 2013-09-28 LAB — LIPASE, BLOOD: Lipase: 43 U/L (ref 11–59)

## 2013-09-28 LAB — I-STAT CG4 LACTIC ACID, ED: Lactic Acid, Venous: 0.44 mmol/L — ABNORMAL LOW (ref 0.5–2.2)

## 2013-09-28 MED ORDER — METRONIDAZOLE 500 MG PO TABS
2000.0000 mg | ORAL_TABLET | Freq: Once | ORAL | Status: AC
  Administered 2013-09-28: 2000 mg via ORAL
  Filled 2013-09-28: qty 4

## 2013-09-28 MED ORDER — ONDANSETRON 4 MG PO TBDP
8.0000 mg | ORAL_TABLET | Freq: Once | ORAL | Status: AC
  Administered 2013-09-28: 8 mg via ORAL
  Filled 2013-09-28: qty 2

## 2013-09-28 MED ORDER — OXYCODONE-ACETAMINOPHEN 5-325 MG PO TABS
1.0000 | ORAL_TABLET | Freq: Once | ORAL | Status: AC
  Administered 2013-09-28: 1 via ORAL
  Filled 2013-09-28: qty 1

## 2013-09-28 NOTE — Discharge Instructions (Signed)
YOUR TEST RESULTS TODAY DO NOT REVEAL ANY SIGNS OF INFECTION   Abdominal (belly) pain can be caused by many things. any cases can be observed and treated at home after initial evaluation in the emergency department. Even though you are being discharged home, abdominal pain can be unpredictable. Therefore, you need a repeated exam if your pain does not resolve, returns, or worsens. Most patients with abdominal pain don't have to be admitted to the hospital or have surgery, but serious problems like appendicitis and gallbladder attacks can start out as nonspecific pain. Many abdominal conditions cannot be diagnosed in one visit, so follow-up evaluations are very important. SEEK IMMEDIATE MEDICAL ATTENTION IF: The pain does not go away or becomes severe, particularly over the next 8-12 hours.  A temperature above 100.69F develops.  Repeated vomiting occurs (multiple episodes).  The pain becomes localized to portions of the abdomen. The right side could possibly be appendicitis. In an adult, the left lower portion of the abdomen could be colitis or diverticulitis.  Blood is being passed in stools or vomit (bright red or black tarry stools).  Return also if you develop chest pain, difficulty breathing, dizziness or fainting, or become confused, poorly responsive, or inconsolable.

## 2013-09-28 NOTE — ED Provider Notes (Signed)
CSN: [id number]     Arrival date & time 09/28/13  [id number] History   First MD Initiated Contact with Patient 09/28/13 0827     Chief Complaint  Patient presents with  . Abdominal Pain      Patient is a 53 y.o. male presenting with abdominal pain. The history is provided by the patient.  Abdominal Pain Pain location:  LUQ Pain quality: cramping   Pain radiates to:  Does not radiate Pain severity:  Moderate Onset quality:  Gradual Duration:  1 week Timing:  Constant Progression:  Unchanged Chronicity:  New Relieved by:  None tried Worsened by:  Palpation Associated symptoms: chest pain, diarrhea, dysuria and nausea   Associated symptoms: no fever, no hematemesis, no hematochezia, no shortness of breath and no vomiting   Associated symptoms comment:  Chest pain worse with palpation  pt reports LUQ pain for one week No known injury He also mentions dysuria but no penile discharge He mentions mild CP that is worse with palpation.  When asked he reports he has a "little bit" He also mention "little bit" of shortness of breath    Past Medical History  Diagnosis Date  . Chronic pain    Past Surgical History  Procedure Laterality Date  . Orthopedic surgery     No family history on file. History  Substance Use Topics  . Smoking status: Current Every Day Smoker -- 0.50 packs/day    Types: Cigarettes  . Smokeless tobacco: Not on file  . Alcohol Use: 4.2 oz/week    7 Cans of beer per week     Comment: daily    Review of Systems  Constitutional: Negative for fever.  Respiratory: Negative for shortness of breath.   Cardiovascular: Positive for chest pain.  Gastrointestinal: Positive for nausea, abdominal pain and diarrhea. Negative for vomiting, hematochezia and hematemesis.  Genitourinary: Positive for dysuria. Negative for discharge.  Neurological: Negative for weakness.  All other systems reviewed and are negative.     Allergies  Review of patient's allergies  indicates no known allergies.  Home Medications  No current outpatient prescriptions on file. BP 117/77  Pulse 54  Temp(Src) 98.3 F (36.8 C) (Oral)  Resp 18  Ht 5\' 9"  (1.753 m)  Wt 205 lb (92.987 kg)  BMI 30.26 kg/m2  SpO2 98% Physical Exam CONSTITUTIONAL: Well developed/well nourished, pt appears comfortable, watching TV HEAD: Normocephalic/atraumatic EYES: EOMI/PERRL ENMT: Mucous membranes moist NECK: supple no meningeal signs SPINE:entire spine nontender CV: S1/S2 noted, no murmurs/rubs/gallops noted LUNGS: Lungs are clear to auscultation bilaterally, no apparent distress ABDOMEN: soft, mild LUQ tenderness, no rebound or guarding GU:no cva tenderness, no inguinal hernia noted, no scrotal tenderness/erythema noted NEURO: Pt is awake/alert, moves all extremitiesx4 EXTREMITIES: pulses normal, full ROM SKIN: warm, color normal PSYCH: no abnormalities of mood noted  ED Course  Procedures  9:23 AM Pt well appearing, no distress, resting comfortably He has mild LUQ tenderness All other parts of exam unremarkable He mentions he has had reproducible CP recently, will obtain EKG Will also obtain labs He does not request any STD check at this time 10:47 AM Pt sleeping on re-evaluation I doubt acute abdominal process at this time He is well appearing  At time of discharge he asks if he has trichomonas.  I told him his u/a was negative for trich.  He wanted me to tell his partner it was negative and he requested definitive treatment here "just in case" He did not want any other testing.  I told him I put on his paperwork that his tests were negative but I would not speak to anyone on phone about this test results Safe sex practices info given to patient   Labs Review Labs Reviewed  CBC WITH DIFFERENTIAL - Abnormal; Notable for the following:    WBC 3.3 (*)    Platelets 139 (*)    Neutrophils Relative % 36 (*)    Neutro Abs 1.2 (*)    Lymphocytes Relative 50 (*)    All  other components within normal limits  I-STAT CG4 LACTIC ACID, ED - Abnormal; Notable for the following:    Lactic Acid, Venous 0.44 (*)    All other components within normal limits  COMPREHENSIVE METABOLIC PANEL  LIPASE, BLOOD  URINALYSIS, ROUTINE W REFLEX MICROSCOPIC    Date: 09/28/2013  Rate: 59  Rhythm: normal sinus rhythm  QRS Axis: normal  Intervals: normal  ST/T Wave abnormalities: early repolarization  Conduction Disutrbances:none  Narrative Interpretation:   Old EKG Reviewed: none available at time of interpretation    MDM   Final diagnoses:  Abdominal pain    Nursing notes including past medical history and social history reviewed and considered in documentation Labs/vital reviewed and considered     Joya Gaskins, MD 09/28/13 1049

## 2013-09-28 NOTE — ED Notes (Signed)
Care management at bedside.

## 2013-09-28 NOTE — Progress Notes (Signed)
Met patient at bedside ( patient has had 6 ED visits in six months) Role of CM explained.Patient reports Increased stomach pain as reason for today's ED visit.Patient reports he  Does not have a PCP.CM provided education on importance of establishing a PCP for primary health care needs.Patient reports understanding and provided with a resource sheet  For the [facility] community health wellness clinic on [address] and a resource card for the [employer] [facility].Patient provided his verbal consent for CM to E-MAIL the [facility] and  Set up his PCP follow up.Best contact number confirmed with the patient.Teach back method used to ensure patient understanding.

## 2013-09-28 NOTE — ED Notes (Signed)
Pt c/o left lateral abd cramping, sts it started a week ago. Denies n/v. sts he has had some diarrhea. Denies dysuria, difficulty urinating/blood in urine/fever. Nad, skin warm and dry, resp e/u.

## 2013-10-16 ENCOUNTER — Encounter (HOSPITAL_COMMUNITY): Payer: Self-pay | Admitting: Emergency Medicine

## 2013-10-16 DIAGNOSIS — R109 Unspecified abdominal pain: Secondary | ICD-10-CM | POA: Insufficient documentation

## 2013-10-16 DIAGNOSIS — G8929 Other chronic pain: Secondary | ICD-10-CM | POA: Insufficient documentation

## 2013-10-16 DIAGNOSIS — F17200 Nicotine dependence, unspecified, uncomplicated: Secondary | ICD-10-CM | POA: Insufficient documentation

## 2013-10-16 DIAGNOSIS — R197 Diarrhea, unspecified: Secondary | ICD-10-CM | POA: Insufficient documentation

## 2013-10-16 LAB — COMPREHENSIVE METABOLIC PANEL
ALT: 11 U/L (ref 0–53)
AST: 15 U/L (ref 0–37)
Albumin: 3.9 g/dL (ref 3.5–5.2)
Alkaline Phosphatase: 47 U/L (ref 39–117)
BUN: 13 mg/dL (ref 6–23)
CO2: 25 mEq/L (ref 19–32)
Calcium: 9.5 mg/dL (ref 8.4–10.5)
Chloride: 102 mEq/L (ref 96–112)
Creatinine, Ser: 1.03 mg/dL (ref 0.50–1.35)
GFR calc Af Amer: >90 mL/min (ref >90)
GFR calc non Af Amer: 82 mL/min — ABNORMAL LOW (ref >90)
Glucose, Bld: 111 mg/dL — ABNORMAL HIGH (ref 70–99)
Potassium: 3.6 mEq/L — ABNORMAL LOW (ref 3.7–5.3)
Sodium: 141 mEq/L (ref 137–147)
Total Bilirubin: 0.5 mg/dL (ref 0.3–1.2)
Total Protein: 7.3 g/dL (ref 6.0–8.3)

## 2013-10-16 LAB — CBC WITH DIFFERENTIAL/PLATELET
Basophils Absolute: 0 10*3/uL (ref 0.0–0.1)
Basophils Relative: 1 % (ref 0–1)
Eosinophils Absolute: 0 10*3/uL (ref 0.0–0.7)
Eosinophils Relative: 1 % (ref 0–5)
HCT: 43.4 % (ref 39.0–52.0)
Hemoglobin: 15.4 g/dL (ref 13.0–17.0)
Lymphocytes Relative: 48 % — ABNORMAL HIGH (ref 12–46)
Lymphs Abs: 2 10*3/uL (ref 0.7–4.0)
MCH: 31.5 pg (ref 26.0–34.0)
MCHC: 35.5 g/dL (ref 30.0–36.0)
MCV: 88.8 fL (ref 78.0–100.0)
Monocytes Absolute: 0.2 10*3/uL (ref 0.1–1.0)
Monocytes Relative: 6 % (ref 3–12)
Neutro Abs: 1.8 10*3/uL (ref 1.7–7.7)
Neutrophils Relative %: 44 % (ref 43–77)
Platelets: 155 10*3/uL (ref 150–400)
RBC: 4.89 MIL/uL (ref 4.22–5.81)
RDW: 13 % (ref 11.5–15.5)
WBC: 4.1 10*3/uL (ref 4.0–10.5)

## 2013-10-16 LAB — URINALYSIS, ROUTINE W REFLEX MICROSCOPIC
Bilirubin Urine: NEGATIVE
Glucose, UA: NEGATIVE mg/dL
Hgb urine dipstick: NEGATIVE
Ketones, ur: NEGATIVE mg/dL
Leukocytes, UA: NEGATIVE
Nitrite: NEGATIVE
Protein, ur: NEGATIVE mg/dL
Specific Gravity, Urine: 1.018 (ref 1.005–1.030)
Urobilinogen, UA: 1 mg/dL (ref 0.0–1.0)
pH: 6 (ref 5.0–8.0)

## 2013-10-16 NOTE — ED Notes (Signed)
3 days diarr, stomach cramping and left flank pain. Nothing makes it worse or better; burning/pain with urination. Denies nausea or vomiting.

## 2013-10-17 ENCOUNTER — Emergency Department (HOSPITAL_COMMUNITY)
Admission: EM | Admit: 2013-10-17 | Discharge: 2013-10-17 | Disposition: A | Discharge status: Home / Self Care | Payer: Self-pay | Attending: Emergency Medicine | Admitting: Emergency Medicine

## 2013-10-17 DIAGNOSIS — R197 Diarrhea, unspecified: Secondary | ICD-10-CM

## 2013-10-17 DIAGNOSIS — R109 Unspecified abdominal pain: Secondary | ICD-10-CM

## 2013-10-17 MED ORDER — DICYCLOMINE HCL 20 MG PO TABS: 20.0000 mg | ORAL_TABLET | Freq: Four times a day (QID) | ORAL | Status: DC | PRN

## 2013-10-17 MED ORDER — ACETAMINOPHEN 325 MG PO TABS
650.0000 mg | ORAL_TABLET | Freq: Once | ORAL | Status: AC
  Administered 2013-10-17: 650 mg via ORAL
  Filled 2013-10-17: qty 2

## 2013-10-17 MED ORDER — LANSOPRAZOLE 30 MG PO CPDR: 30.0000 mg | DELAYED_RELEASE_CAPSULE | Freq: Two times a day (BID) | ORAL | Status: DC

## 2013-10-17 MED ORDER — PANTOPRAZOLE SODIUM 40 MG PO TBEC
40.0000 mg | DELAYED_RELEASE_TABLET | Freq: Two times a day (BID) | ORAL | Status: DC
  Administered 2013-10-17: 40 mg via ORAL
  Filled 2013-10-17: qty 1

## 2013-10-17 MED ORDER — DICYCLOMINE HCL 10 MG PO CAPS
20.0000 mg | ORAL_CAPSULE | Freq: Once | ORAL | Status: AC
  Administered 2013-10-17: 20 mg via ORAL
  Filled 2013-10-17: qty 2

## 2013-10-17 MED ORDER — GI COCKTAIL ~~LOC~~
30.0000 mL | Freq: Once | ORAL | Status: AC
Start: 2013-10-17 — End: 2013-10-17
  Administered 2013-10-17: 30 mL via ORAL
  Filled 2013-10-17: qty 30

## 2013-10-17 NOTE — Discharge Instructions (Signed)
Abdominal Pain, Adult °Many things can cause abdominal pain. Usually, abdominal pain is not caused by a disease and will improve without treatment. It can often be observed and treated at home. Your health care provider will do a physical exam and possibly order blood tests and X-rays to help determine the seriousness of your pain. However, in many cases, more time must pass before a clear cause of the pain can be found. Before that point, your health care provider may not know if you need more testing or further treatment. °HOME CARE INSTRUCTIONS  °Monitor your abdominal pain for any changes. The following actions may help to alleviate any discomfort you are experiencing: °· Only take over-the-counter or prescription medicines as directed by your health care provider. °· Do not take laxatives unless directed to do so by your health care provider. °· Try a clear liquid diet (broth, tea, or water) as directed by your health care provider. Slowly move to a bland diet as tolerated. °SEEK MEDICAL CARE IF: °· You have unexplained abdominal pain. °· You have abdominal pain associated with nausea or diarrhea. °· You have pain when you urinate or have a bowel movement. °· You experience abdominal pain that wakes you in the night. °· You have abdominal pain that is worsened or improved by eating food. °· You have abdominal pain that is worsened with eating fatty foods. °SEEK IMMEDIATE MEDICAL CARE IF:  °· Your pain does not go away within 2 hours. °· You have a fever. °· You keep throwing up (vomiting). °· Your pain is felt only in portions of the abdomen, such as the right side or the left lower portion of the abdomen. °· You pass bloody or black tarry stools. °MAKE SURE YOU: °· Understand these instructions.   °· Will watch your condition.   °· Will get help right away if you are not doing well or get worse.   °Document Released: 03/15/2005 Document Revised: 03/26/2013 Document Reviewed: 02/12/2013 °ExitCare® Patient  Information ©2014 [employer]. ° °Diarrhea °Diarrhea is frequent loose and watery bowel movements. It can cause you to feel weak and dehydrated. Dehydration can cause you to become tired and thirsty, have a dry mouth, and have decreased urination that often is dark yellow. Diarrhea is a sign of another problem, most often an infection that will not last long. In most cases, diarrhea typically lasts 2 3 days. However, it can last longer if it is a sign of something more serious. It is important to treat your diarrhea as directed by your caregive to lessen or prevent future episodes of diarrhea. °CAUSES  °Some common causes include: °· Gastrointestinal infections caused by viruses, bacteria, or parasites. °· Food poisoning or food allergies. °· Certain medicines, such as antibiotics, chemotherapy, and laxatives. °· Artificial sweeteners and fructose. °· Digestive disorders. °HOME CARE INSTRUCTIONS °· Ensure adequate fluid intake (hydration): have 1 cup (8 oz) of fluid for each diarrhea episode. Avoid fluids that contain simple sugars or sports drinks, fruit juices, whole milk products, and sodas. Your urine should be clear or pale yellow if you are drinking enough fluids. Hydrate with an oral rehydration solution that you can purchase at pharmacies, retail stores, and online. You can prepare an oral rehydration solution at home by mixing the following ingredients together: °·   tsp table salt. °· ¾ tsp baking soda. °·  tsp salt substitute containing potassium chloride. °· 1  tablespoons sugar. °· 1 L (34 oz) of water. °· Certain foods and beverages may increase the   speed at which food moves through the gastrointestinal (GI) tract. These foods and beverages should be avoided and include:  Caffeinated and alcoholic beverages.  High-fiber foods, such as raw fruits and vegetables, nuts, seeds, and whole grain breads and cereals.  Foods and beverages sweetened with sugar alcohols, such as xylitol, sorbitol, and  mannitol.  Some foods may be well tolerated and may help thicken stool including:  Starchy foods, such as rice, toast, pasta, low-sugar cereal, oatmeal, grits, baked potatoes, crackers, and bagels.  Bananas.  Applesauce.  Add probiotic-rich foods to help increase healthy bacteria in the GI tract, such as yogurt and fermented milk products.  Wash your hands well after each diarrhea episode.  Only take over-the-counter or prescription medicines as directed by your caregiver.  Take a warm bath to relieve any burning or pain from frequent diarrhea episodes. SEEK IMMEDIATE MEDICAL CARE IF:   You are unable to keep fluids down.  You have persistent vomiting.  You have blood in your stool, or your stools are black and tarry.  You do not urinate in 6 8 hours, or there is only a small amount of very dark urine.  You have abdominal pain that increases or localizes.  You have weakness, dizziness, confusion, or lightheadedness.  You have a severe headache.  Your diarrhea gets worse or does not get better.  You have a fever or persistent symptoms for more than 2 3 days.  You have a fever and your symptoms suddenly get worse. MAKE SURE YOU:   Understand these instructions.  Will watch your condition.  Will get help right away if you are not doing well or get worse. Document Released: 05/26/2002 Document Revised: 05/22/2012 Document Reviewed: 02/11/2012 [facility] PsExitCare Patient Information 2014 [place], [place].  Diet for Diarrhea, Adult Frequent, runny stools (diarrhea) may be caused or worsened by food or drink. Diarrhea may be relieved by changing your diet. Since diarrhea can last up to 7 days, it is easy for you to lose too much fluid from the body and become dehydrated. Fluids that are lost need to be replaced. Along with a modified diet, make sure you drink enough fluids to keep your urine clear or pale yellow. DIET INSTRUCTIONS  Ensure adequate fluid intake (hydration): have 1 cup  (8 oz) of fluid for each diarrhea episode. Avoid fluids that contain simple sugars or sports drinks, fruit juices, whole milk products, and sodas. Your urine should be clear or pale yellow if you are drinking enough fluids. Hydrate with an oral rehydration solution that you can purchase at pharmacies, retail stores, and online. You can prepare an oral rehydration solution at home by mixing the following ingredients together:    tsp table salt.   tsp baking soda.   tsp salt substitute containing potassium chloride.  1  tablespoons sugar.  1 L (34 oz) of water.  Certain foods and beverages may increase the speed at which food moves through the gastrointestinal (GI) tract. These foods and beverages should be avoided and include:  Caffeinated and alcoholic beverages.  High-fiber foods, such as raw fruits and vegetables, nuts, seeds, and whole grain breads and cereals.  Foods and beverages sweetened with sugar alcohols, such as xylitol, sorbitol, and mannitol.  Some foods may be well tolerated and may help thicken stool including:  Starchy foods, such as rice, toast, pasta, low-sugar cereal, oatmeal, grits, baked potatoes, crackers, and bagels.   Bananas.   Applesauce.  Add probiotic-rich foods to help increase healthy bacteria in the GI  tract, such as yogurt and fermented milk products. RECOMMENDED FOODS AND BEVERAGES Starches Choose foods with less than 2 g of fiber per serving.  Recommended:  White, [place], and pita breads, plain rolls, buns, bagels. Plain muffins, matzo. Soda, saltine, or graham crackers. Pretzels, melba toast, zwieback. Cooked cereals made with water: cornmeal, farina, cream cereals. Dry cereals: refined corn, wheat, rice. Potatoes prepared any way without skins, refined macaroni, spaghetti, noodles, refined rice.  Avoid:  Bread, rolls, or crackers made with whole wheat, multi-grains, rye, bran seeds, nuts, or coconut. Corn tortillas or taco shells. Cereals  containing whole grains, multi-grains, bran, coconut, nuts, raisins. Cooked or dry oatmeal. Coarse wheat cereals, granola. Cereals advertised as "high-fiber." Potato skins. Whole grain pasta, wild or brown rice. Popcorn. Sweet potatoes, yams. Sweet rolls, doughnuts, waffles, pancakes, sweet breads. Vegetables  Recommended: Strained tomato and vegetable juices. Most well-cooked and canned vegetables without seeds. Fresh: Tender lettuce, cucumber without the skin, cabbage, spinach, bean sprouts.  Avoid: Fresh, cooked, or canned: Artichokes, baked beans, beet greens, broccoli, Brussels sprouts, corn, kale, legumes, peas, sweet potatoes. Cooked: Green or red cabbage, spinach. Avoid large servings of any vegetables because vegetables shrink when cooked, and they contain more fiber per serving than fresh vegetables. Fruit  Recommended: Cooked or canned: Apricots, applesauce, cantaloupe, cherries, fruit cocktail, grapefruit, grapes, kiwi, mandarin oranges, peaches, pears, plums, watermelon. Fresh: Apples without skin, ripe banana, grapes, cantaloupe, cherries, grapefruit, peaches, oranges, plums. Keep servings limited to  cup or 1 piece.  Avoid: Fresh: Apples with skin, apricots, mangoes, pears, raspberries, strawberries. Prune juice, stewed or dried prunes. Dried fruits, raisins, dates. Large servings of all fresh fruits. Protein  Recommended: Ground or well-cooked tender beef, ham, veal, lamb, pork, or poultry. Eggs. Fish, oysters, shrimp, lobster, other seafoods. Liver, organ meats.  Avoid: Tough, fibrous meats with gristle. Peanut butter, smooth or chunky. Cheese, nuts, seeds, legumes, dried peas, beans, lentils. Dairy  Recommended: Yogurt, lactose-free milk, kefir, drinkable yogurt, buttermilk, soy milk, or plain hard cheese.  Avoid: Milk, chocolate milk, beverages made with milk, such as milkshakes. Soups  Recommended: Bouillon, broth, or soups made from allowed foods. Any strained  soup.  Avoid: Soups made from vegetables that are not allowed, cream or milk-based soups. Desserts and Sweets  Recommended: Sugar-free gelatin, sugar-free frozen ice pops made without sugar alcohol.  Avoid: Plain cakes and cookies, pie made with fruit, pudding, custard, cream pie. Gelatin, fruit, ice, sherbet, frozen ice pops. Ice cream, ice milk without nuts. Plain hard candy, honey, jelly, molasses, syrup, sugar, chocolate syrup, gumdrops, marshmallows. Fats and Oils  Recommended: Limit fats to less than 8 tsp per day.  Avoid: Seeds, nuts, olives, avocados. Margarine, butter, cream, mayonnaise, salad oils, plain salad dressings. Plain gravy, crisp bacon without rind. Beverages  Recommended: Water, decaffeinated teas, oral rehydration solutions, sugar-free beverages not sweetened with sugar alcohols.  Avoid: Fruit juices, caffeinated beverages (coffee, tea, soda), alcohol, sports drinks, or lemon-lime soda. Condiments  Recommended: Ketchup, mustard, horseradish, vinegar, cocoa powder. Spices in moderation: allspice, basil, bay leaves, celery powder or leaves, cinnamon, cumin powder, curry powder, ginger, mace, marjoram, onion or garlic powder, oregano, paprika, parsley flakes, ground pepper, rosemary, sage, savory, tarragon, thyme, turmeric.  Avoid: Coconut, honey. Document Released: 08/26/2003 Document Revised: 02/28/2012 Document Reviewed: 10/20/2011 [facility] DivisionExitCare Patient Information 2014 [place]ExitCare, MarylandLLC.

## 2013-10-17 NOTE — ED Provider Notes (Signed)
CSN: [id number]     Arrival date & time 10/16/13  1828 History   First MD Initiated Contact with Patient 10/17/13 0041     Chief Complaint  Patient presents with  . Diarrhea  . Flank Pain     (Consider location/radiation/quality/duration/timing/severity/associated sxs/prior Treatment) HPI 53 year old male presents to the emergency department from home with complaint of diffuse abdominal pain, crampy in nature and diarrhea.  Patient reports symptoms started 3-4 days ago.  Of note, patient was seen for similar complaints 2 weeks ago.  He reports symptoms improved slightly after that visit, and then returned.  Patient reports he has 2-3 loose stools a day.  No fevers no chills, no blood or mucus in his stools.  Patient reports pain is worse in his upper abdomen, radiates around to the sides of his abdomen, also with pain to left flank.  He denies any trauma to the area, he works as a Chemical engineerpainter lifting and twisting often.  Pain is slightly worse with movement.  No nausea or vomiting.  No prior history of IBS or gastritis.  Patient reports regular alcohol use.  No weight loss.  No prior abdominal surgeries. Past Medical History  Diagnosis Date  . Chronic pain    Past Surgical History  Procedure Laterality Date  . Orthopedic surgery     History reviewed. No pertinent family history. History  Substance Use Topics  . Smoking status: Current Every Day Smoker -- 0.50 packs/day    Types: Cigarettes  . Smokeless tobacco: Not on file  . Alcohol Use: 4.2 oz/week    7 Cans of beer per week     Comment: daily    Review of Systems   See History of Present Illness; otherwise all other systems are reviewed and negative  Allergies  Review of patient's allergies indicates no known allergies.  Home Medications   Prior to Admission medications   Not on File   BP 115/75  Pulse 69  Temp(Src) 98 F (36.7 C) (Oral)  Resp 18  SpO2 99% Physical Exam  Nursing note and vitals  reviewed. Constitutional: He is oriented to person, place, and time. He appears well-developed and well-nourished. No distress.  HENT:  Head: Normocephalic and atraumatic.  Nose: Nose normal.  Mouth/Throat: Oropharynx is clear and moist.  Eyes: Conjunctivae and EOM are normal. Pupils are equal, round, and reactive to light.  Neck: Normal range of motion. Neck supple. No JVD present. No tracheal deviation present. No thyromegaly present.  Cardiovascular: Normal rate, regular rhythm, normal heart sounds and intact distal pulses.  Exam reveals no gallop and no friction rub.   No murmur heard. Pulmonary/Chest: Effort normal and breath sounds normal. No stridor. No respiratory distress. He has no wheezes. He has no rales. He exhibits no tenderness.  Abdominal: Soft. Bowel sounds are normal. He exhibits no distension and no mass. There is tenderness (mild diffuse tenderness worse in epigastrium, no rebound or guarding). There is no rebound and no guarding.  Musculoskeletal: Normal range of motion. He exhibits no edema and no tenderness.  Lymphadenopathy:    He has no cervical adenopathy.  Neurological: He is alert and oriented to person, place, and time. He exhibits normal muscle tone. Coordination normal.  Skin: Skin is warm and dry. No rash noted. No erythema. No pallor.  Psychiatric: He has a normal mood and affect. His behavior is normal. Judgment and thought content normal.    ED Course  Procedures (including critical care time) Labs Review Labs Reviewed  COMPREHENSIVE METABOLIC PANEL - Abnormal; Notable for the following:    Potassium 3.6 (*)    Glucose, Bld 111 (*)    GFR calc non Af Amer 82 (*)    All other components within normal limits  CBC WITH DIFFERENTIAL - Abnormal; Notable for the following:    Lymphocytes Relative 48 (*)    All other components within normal limits  URINALYSIS, ROUTINE W REFLEX MICROSCOPIC    Imaging Review No results found.   EKG  Interpretation None      MDM   Final diagnoses:  Abdominal pain  Diarrhea    53 year old male with persistent diarrhea, abdominal cramping.  Labs are unremarkable.  Abdomen is benign aside from mild diffuse tenderness worse in epigastrium.  Plan for Protonix, GI cocktail, Bentyl.  Will discharge on Bentyl and Protonix for the next 2 weeks to see if symptoms improve.  We'll give patient primary care physician information, and patient has been in size he will need further workup if symptoms continue.  Patient overall looks well, nontoxic.  Stable for outpatient evaluation if symptoms do not resolve.   Olivia Mackielga M Afnan Cadiente, MD 10/17/13 [phone]

## 2013-12-21 ENCOUNTER — Emergency Department (HOSPITAL_COMMUNITY)
Admission: EM | Admit: 2013-12-21 | Discharge: 2013-12-21 | Disposition: A | Discharge status: Home / Self Care | Payer: Self-pay | Attending: Emergency Medicine | Admitting: Emergency Medicine

## 2013-12-21 ENCOUNTER — Encounter (HOSPITAL_COMMUNITY): Payer: Self-pay | Admitting: Emergency Medicine

## 2013-12-21 DIAGNOSIS — F17200 Nicotine dependence, unspecified, uncomplicated: Secondary | ICD-10-CM | POA: Insufficient documentation

## 2013-12-21 DIAGNOSIS — H9209 Otalgia, unspecified ear: Secondary | ICD-10-CM | POA: Insufficient documentation

## 2013-12-21 DIAGNOSIS — G8929 Other chronic pain: Secondary | ICD-10-CM | POA: Insufficient documentation

## 2013-12-21 DIAGNOSIS — H9202 Otalgia, left ear: Secondary | ICD-10-CM

## 2013-12-21 MED ORDER — IBUPROFEN 800 MG PO TABS: 800.0000 mg | ORAL_TABLET | Freq: Three times a day (TID) | ORAL | Status: DC

## 2013-12-21 MED ORDER — ANTIPYRINE-BENZOCAINE 5.4-1.4 % OT SOLN
3.0000 [drp] | Freq: Once | OTIC | Status: AC
  Administered 2013-12-21: 4 [drp] via OTIC
  Filled 2013-12-21: qty 10

## 2013-12-21 MED ORDER — IBUPROFEN 400 MG PO TABS
800.0000 mg | ORAL_TABLET | Freq: Once | ORAL | Status: AC
  Administered 2013-12-21: 800 mg via ORAL
  Filled 2013-12-21: qty 4

## 2013-12-21 NOTE — Discharge Instructions (Signed)
Use auralgan ear drops 3-4 drops up to 4 times daily for 2-3 days - stop using this after a 2-3 days  Ibuprofen for pain  Return to the emergency department if you develop any changing/worsening condition, fever, drainage, or any other concerns (please read additional information regarding your condition below)     Otalgia The most common reason for this in children is an infection of the middle ear. Pain from the middle ear is usually caused by a build-up of fluid and pressure behind the eardrum. Pain from an earache can be sharp, dull, or burning. The pain may be temporary or constant. The middle ear is connected to the nasal passages by a short narrow tube called the Eustachian tube. The Eustachian tube allows fluid to drain out of the middle ear, and helps keep the pressure in your ear equalized. CAUSES  A cold or allergy can block the Eustachian tube with inflammation and the build-up of secretions. This is especially likely in small children, because their Eustachian tube is shorter and more horizontal. When the Eustachian tube closes, the normal flow of fluid from the middle ear is stopped. Fluid can accumulate and cause stuffiness, pain, hearing loss, and an ear infection if germs start growing in this area. SYMPTOMS  The symptoms of an ear infection may include fever, ear pain, fussiness, increased crying, and irritability. Many children will have temporary and minor hearing loss during and right after an ear infection. Permanent hearing loss is rare, but the risk increases the more infections a child has. Other causes of ear pain include retained water in the outer ear canal from swimming and bathing. Ear pain in adults is less likely to be from an ear infection. Ear pain may be referred from other locations. Referred pain may be from the joint between your jaw and the skull. It may also come from a tooth problem or problems in the neck. Other causes of ear pain include:  A foreign body in  the ear.  Outer ear infection.  Sinus infections.  Impacted ear wax.  Ear injury.  Arthritis of the jaw or TMJ problems.  Middle ear infection.  Tooth infections.  Sore throat with pain to the ears. DIAGNOSIS  Your caregiver can usually make the diagnosis by examining you. Sometimes other special studies, including x-rays and lab work may be necessary. TREATMENT   If antibiotics were prescribed, use them as directed and finish them even if you or your child's symptoms seem to be improved.  Sometimes PE tubes are needed in children. These are little plastic tubes which are put into the eardrum during a simple surgical procedure. They allow fluid to drain easier and allow the pressure in the middle ear to equalize. This helps relieve the ear pain caused by pressure changes. HOME CARE INSTRUCTIONS   Only take over-the-counter or prescription medicines for pain, discomfort, or fever as directed by your caregiver. DO NOT GIVE CHILDREN ASPIRIN because of the association of Reye's Syndrome in children taking aspirin.  Use a cold pack applied to the outer ear for 15-20 minutes, 03-04 times per day or as needed may reduce pain. Do not apply ice directly to the skin. You may cause frost bite.  Over-the-counter ear drops used as directed may be effective. Your caregiver may sometimes prescribe ear drops.  Resting in an upright position may help reduce pressure in the middle ear and relieve pain.  Ear pain caused by rapidly descending from high altitudes can be relieved by  swallowing or chewing gum. Allowing infants to suck on a bottle during airplane travel can help.  Do not smoke in the house or near children. If you are unable to quit smoking, smoke outside.  Control allergies. SEEK IMMEDIATE MEDICAL CARE IF:   You or your child are becoming sicker.  Pain or fever relief is not obtained with medicine.  You or your child's symptoms (pain, fever, or irritability) do not improve  within 24 to 48 hours or as instructed.  Severe pain suddenly stops hurting. This may indicate a ruptured eardrum.  You or your children develop new problems such as severe headaches, stiff neck, difficulty swallowing, or swelling of the face or around the ear. Document Released: 01/21/2004 Document Revised: 08/28/2011 Document Reviewed: 05/27/2008 [facility] Patient Information 2015 [place], [place]. This information is not intended to replace advice given to you by your health care provider. Make sure you discuss any questions you have with your health care provider.   Emergency Department Resource Guide 1) Find a Doctor and Pay Out of Pocket Although you won't have to find out who is covered by your insurance plan, it is a good idea to ask around and get recommendations. You will then need to call the office and see if the doctor you have chosen will accept you as a new patient and what types of options they offer for patients who are self-pay. Some doctors offer discounts or will set up payment plans for their patients who do not have insurance, but you will need to ask so you aren't surprised when you get to your appointment.  2) Contact Your Local Health Department Not all health departments have doctors that can see patients for sick visits, but many do, so it is worth a call to see if yours does. If you don't know where your local health department is, you can check in your phone book. The CDC also has a tool to help you locate your state's health department, and many state websites also have listings of all of their local health departments.  3) Find a Walk-in Clinic If your illness is not likely to be very severe or complicated, you may want to try a walk in clinic. These are popping up all over the country in pharmacies, drugstores, and shopping centers. They're usually staffed by nurse practitioners or physician assistants that have been trained to treat common illnesses and complaints.  They're usually fairly quick and inexpensive. However, if you have serious medical issues or chronic medical problems, these are probably not your best option.  No Primary Care Doctor: - Call Health Connect at  [phone] - they can help you locate a primary care doctor that  accepts your insurance, provides certain services, etc. - Physician Referral Service- [phone]  Chronic Pain Problems: Organization         Address  Phone   Notes  [facility]  [phone] Patients need to be referred by their primary care doctor.   Medication Assistance: Organization         Address  Phone   Notes  [facility] Medication [facility] [address] [place], [place] [address] [phone]) [phone] --Must be a resident of [facility] -- Must have NO insurance coverage whatsoever (no Medicaid/ Medicare, etc.) -- The pt. MUST have a primary care doctor that directs their care regularly and follows them in the community   [facility]ist  [phone]) [phone]   [address]  [phone]  Agencies that provide inexpensive medical care: Organization         Address  Phone   Notes  [facility]  [phone]   [facility]    [phone]) [phone]   [facility] [address] [place], [place] [address] [phone]   [facility] [place] [address], [place] [phone]   [facility]anned Parenthood    [phone]   [facility]    [phone]) [phone]   [facility]  [address], [place] Phone:  [phone], Fax:  [phone] Hours of Operation:  9 am - 6 pm, M-F.  Also accepts Medicaid/Medicare and self-pay.  [facility]  [address], [place] Phone: [phone], Fax: [phone]. Hours of Operation:  8:30 am - 5:30 pm, M-F.  Also accepts Medicaid and self-pay.  [facility] [place] [address], [place]  Phone: [phone]) [phone]   [facility] [address], [place], [place] [phone], Ext. [phone] Mondays & Thursdays: 7-9 AM.  First 15 patients are seen on a first come, first serve basis.    Medicaid-accepting [facility] Providers:  Organization         Address  Phone   Notes  [facility] [address] [place] [address], [place] [phone] Also accepts self-pay patients.  [facility] [address], [place]  [phone]   [facility] [address], [place] [phone]) [phone]   [facility] [address] [place] [address], [place] [phone]) [phone]   Renaye RakersVeita Bland [address], [place]   [phone] Only accepts [place] Access [place] patients after they have their name applied to their card.   Self-Pay (no insurance) in [facility]:  Organization         Address  Phone   Notes  Sickle Cell Patients, [facility] [address], [place] [phone]   [facility] [address], [place] [phone]   [facility] [place]  [address], [place] [phone]   [facility]/Dr. Osei-Bonsu  [address] [place] [address], [place] or [id number], [place] [phone] Phone number for both [place] and [place] locations is the same.  [facility]gent [facility] and [facility] [address], [place] [phone]   [facility] [place] [address] [place] [address], [place] or [address] [phone]   [facility] [address], [place] [phone]) [phone], phone; [phone], fax Sees patients 1st and 3rd Saturday of every month.  Must not qualify for public or private insurance (i.e. Medicaid, Medicare, [place] Health Choice, Veterans' Benefits)  Household income should be no more than 200% of the poverty level The clinic cannot  treat you if you are pregnant or think you are pregnant  Sexually transmitted diseases are not treated at the clinic.    Dental Care: Organization         Address  Phone  Notes  [facility] [address], [place] [phone] Accepts children up to age 52 who are enrolled in [place]icaid or [place] Health Choice; pregnant women with a Medicaid card; and children who have applied for Medicaid or  [place] Health Choice, but were declined, whose parents can pay a reduced fee at time of service.  [facility] [place]  [address], [place] [phone] Accepts children up to age 52 who are enrolled in [place]icaid or [place] Health Choice; pregnant women with a Medicaid card; and children who have applied for Medicaid or [place] Health Choice, but were declined, whose parents can pay a reduced fee at time of service.  [facility]  [address], [place] [phone]) [phone] Patients are seen by appointment only. Walk-ins are not accepted. [facility] will see patients 118 years of age and older. Monday - Tuesday (8am-5pm) Most Wednesdays (8:30-5pm) $30 per visit, cash only  [facility]  [address], [facility] [phone] Patients are seen by appointment only. Walk-ins are not accepted. [facility] will see patients 52 years of age and older. One Wednesday Evening (Monthly: Volunteer Based).  $30 per visit, cash only  [facility]  [phone]) [phone] for adults; Children under age 774, call Graduate Pediatric Dentistry at [phone]. Children aged 84-14, please call [phone] to request a pediatric application.  Dental services are provided in all areas of dental care including fillings, crowns and bridges, complete and partial dentures, implants, gum treatment, root canals, and extractions. Preventive care is also provided.  Treatment is provided to both adults and children. Patients are selected via a lottery and there is often a waiting list.   [facility] [address], [place]  [phone] [email or web address]   [facility] [address], [place], [place] [phone], Ext. [phone] Second and Fourth Thursday of each month, opens at 6:30 AM; Clinic ends at 9 AM.  Patients are seen on a first-come first-served basis, and a limited number are seen during each clinic.   [facility]  [address], [place], [place] [phone]) [phone]   Eligibility Requirements You must have lived in [place], [place], or [place] counties for at least the last three months.   You cannot be eligible for state or federal sponsored [place] Cityhealthcare insurance, including CIGNAVeterans Administration, [place]icaid, or Har[employer] EntertainmentMedicare.   You generally cannot be eligible for healthcare insurance through your employer.    How to apply: Eligibility screenings are held every Tuesday and Wednesday afternoon from 1:00 pm until 4:00 pm. You do not need an appointment for the interview!  [facility] [address], [place], [place] [phone]   [facility]  [phone]   [facility]  [phone]   [facility]  [phone]    Behavioral Health Resources in the Community: Intensive Outpatient Programs Organization         Address  Phone  Notes  [facility] [address], [place], [place] [phone]   [facility] [address], [place], [place] [phone]   [facility] [address], [place], [place]  [phone]   [facility] [address],  [place], [place] [phone] or [phone]   Substance Abuse Resources Organization         Address  Phone  Notes  [facility]  [phone]   [facility]   [phone]   [facility]  [phone]   [facility]  [phone]   Residential & Outpatient Substance Abuse Program  [phone]  Psychological Services Organization         Address  Phone  Notes  [facility]  [phone]   [facility]  [phone]   [facility] [phone] N. [address] [place] [phone] or [phone]    Mobile Crisis Teams Organization         Address  Phone  Notes  [facility], Mobile Crisis Care Unit  [phone]   [facility]  [address] [place], [place] [phone]   Doristine Locks [address] [place] [phone]    Self-Help/Support Groups Organization         Address  Phone             Notes  [facility] [place] - variety of support groups  [phone]- I7437963 Call for more information  Narcotics Anonymous (NA), [facility] [address], [employer] [place]  2 meetings at this location   Statistician         Address  Phone  Notes  [facility] [address],    [place]  [phone]   [facility]  [address], [place] [address], [place], [place] [phone]   [facility] [address] [place], [place] [phone] Admissions: 8am-3pm M-F  [facility] [address]    [place], [place] [phone]   [facility] [address] [place] [address] [place], [place], [place] [phone]   [facility] [address]  [place], [place] [phone]   [facility] - Intensive Outpatient [address], [place], [place] [phone]   [facility] ([facility] [address] [place].,  [place], [place] [phone] or [phone]   [facility] ([facility]) [address] [place], [place] [phone] Accepts Medicaid  [facility] [place] [address],  [place] [phone] Substance  Abuse/Addiction Treatment   [facility] Organization         Address  Phone  Notes  [facility]  [phone]   Angie Fava, PhD [address]vin Knack [place], [place]   [phone] or [phone]   [facility]   [address] [place], [place] [phone]   [facility] [address] [place], [place] [phone] Insurance/Medicaid/sponsorship through [facility] Families [address]                                    [place], [place] [phone] Therapy/tele-psych/case  [facility] [address], [place] [phone]    Dr. Lolly Mustache  [phone]   [facility]  [employer] [facility] Dept. 1) [address], [place] 2) [address] [place] 3)  [address], [place] [phone]  [phone]   [facility] Child Abuse Hotline 715-840-5260 or [phone]) [phone] (After Hours)

## 2013-12-21 NOTE — ED Notes (Signed)
Pt resting in chair talking in clear complete sentences.  Pt states he has been having left ear pain for two weeks.  Some redness noted to the inside of his left ear

## 2013-12-21 NOTE — ED Notes (Signed)
The pt is c/o lt ear pain for one week

## 2013-12-22 NOTE — ED Provider Notes (Signed)
CSN: [id number]     Arrival date & time 12/21/13  2051 History   First MD Initiated Contact with Patient 12/21/13 2159     Chief Complaint  Patient presents with  . Otalgia   HPI  Steve Anderson is a 53 y.o. male with a PMH of chronic pain who presents to the ED for evaluation of otalgia. History was provided by the patient. Patient complains of left ear pain for the past two weeks. Denies any tinnitus, hearing loss, drainage, dizziness, fevers, chills, rhinorrhea, congestion, sore throat, headache, neck pain, or other concerns. Pain is a constant throbbing pain. Did not take anything for pain PTA. No similar pain in the past. States he cleans his ears a lot with Q-tips.    Past Medical History  Diagnosis Date  . Chronic pain    Past Surgical History  Procedure Laterality Date  . Orthopedic surgery     No family history on file. History  Substance Use Topics  . Smoking status: Current Every Day Smoker -- 0.50 packs/day    Types: Cigarettes  . Smokeless tobacco: Not on file  . Alcohol Use: 4.2 oz/week    7 Cans of beer per week     Comment: daily    Review of Systems  Constitutional: Negative for fever, chills, activity change, appetite change and fatigue.  HENT: Positive for ear pain. Negative for congestion, ear discharge, facial swelling, hearing loss, sinus pressure, sore throat and tinnitus.   Eyes: Negative for pain.  Respiratory: Negative for cough.   Gastrointestinal: Negative for nausea and vomiting.  Neurological: Negative for dizziness, weakness, light-headedness and headaches.    Allergies  Review of patient's allergies indicates no known allergies.  Home Medications   Prior to Admission medications   Medication Sig Start Date End Date Taking? Authorizing Provider  ibuprofen (ADVIL,MOTRIN) 800 MG tablet Take 1 tablet (800 mg total) by mouth 3 (three) times daily. 12/21/13   Jillyn LedgerJessica K Jiovani Mccammon, PA-C   BP 142/84  Pulse 78  Temp(Src) 98.5 F (36.9 C) (Oral)   Resp 16  Ht 5\' 9"  (1.753 m)  Wt 191 lb 1 oz (86.665 kg)  BMI 28.20 kg/m2  SpO2 100%  Filed Vitals:   12/21/13 2059 12/21/13 2235  BP: 128/79 142/84  Pulse: 71 78  Temp: 97.9 F (36.6 C) 98.5 F (36.9 C)  TempSrc: Oral Oral  Resp: 18 16  Height: 5\' 9"  (1.753 m)   Weight: 191 lb 1 oz (86.665 kg)   SpO2: 99% 100%    Physical Exam  Nursing note and vitals reviewed. Constitutional: He is oriented to person, place, and time. He appears well-developed and well-nourished. No distress.  Non-toxic  HENT:  Head: Normocephalic and atraumatic.  Right Ear: External ear normal.  Left Ear: External ear normal.  Nose: Nose normal.  Mouth/Throat: Oropharynx is clear and moist. No oropharyngeal exudate.  Tympanic membranes gray and translucent bilaterally with no erythema, edema, or hemotympanum.  No mastoid or tragal tenderness bilaterally. No sinus tenderness throughout. No erythema to the posterior pharynx. Tonsils without edema or exudates. Uvula midline. No trismus. No difficulty controlling secretions.   Eyes: Conjunctivae and EOM are normal. Pupils are equal, round, and reactive to light. Right eye exhibits no discharge. Left eye exhibits no discharge.  Neck: Normal range of motion. Neck supple.  No cervical spinal or paraspinal tenderness to palpation throughout.  No limitations with neck ROM.    Cardiovascular: Normal rate, regular rhythm and normal heart sounds.  Pulmonary/Chest: Effort normal and breath sounds normal. No respiratory distress. He has no wheezes. He has no rales. He exhibits no tenderness.  Abdominal: Soft.  Musculoskeletal: Normal range of motion. He exhibits no edema and no tenderness.  Neurological: He is alert and oriented to person, place, and time.  Skin: Skin is warm and dry. He is not diaphoretic.     ED Course  Procedures (including critical care time) Labs Review Labs Reviewed - No data to display  Imaging Review No results found.   EKG  Interpretation None      MDM   Steve Anderson is a 53 y.o. male with a PMH of chronic pain who presents to the ED for evaluation of otalgia. Ear pain unclear. No evidence of OM, OE, or mastoiditis. Patient afebrile and non-toxic in appearance. Patient had improvements in his pain with Auralgan and Ibuprofen. Instructed to follow-up with PCP if symptoms not improving or worsening. Return precautions, discharge instructions, and follow-up was discussed with the patient before discharge.     Discharge Medication List as of 12/21/2013 10:27 PM    START taking these medications   Details  ibuprofen (ADVIL,MOTRIN) 800 MG tablet Take 1 tablet (800 mg total) by mouth 3 (three) times daily., Starting 12/21/2013, Until Discontinued, Print        Final impressions: 1. Left ear pain       Steve EeJessica Katlin Koua Deeg PA-C           Jillyn LedgerJessica K Freya Zobrist, [place] JerseyPA-C 12/22/13 2047

## 2013-12-25 NOTE — ED Provider Notes (Signed)
  Medical screening examination/treatment/procedure(s) were performed by non-physician practitioner and as supervising physician I was immediately available for consultation/collaboration.   Lyman Balingit, MD 12/25/13 0722 

## 2014-03-10 ENCOUNTER — Encounter (HOSPITAL_COMMUNITY): Payer: Self-pay | Admitting: Emergency Medicine

## 2014-03-10 ENCOUNTER — Emergency Department (HOSPITAL_COMMUNITY)
Admission: EM | Admit: 2014-03-10 | Discharge: 2014-03-11 | Disposition: A | Discharge status: Home / Self Care | Payer: Self-pay | Attending: Emergency Medicine | Admitting: Emergency Medicine

## 2014-03-10 DIAGNOSIS — Z791 Long term (current) use of non-steroidal anti-inflammatories (NSAID): Secondary | ICD-10-CM | POA: Insufficient documentation

## 2014-03-10 DIAGNOSIS — R197 Diarrhea, unspecified: Secondary | ICD-10-CM | POA: Insufficient documentation

## 2014-03-10 DIAGNOSIS — G8929 Other chronic pain: Secondary | ICD-10-CM | POA: Insufficient documentation

## 2014-03-10 DIAGNOSIS — F17200 Nicotine dependence, unspecified, uncomplicated: Secondary | ICD-10-CM | POA: Insufficient documentation

## 2014-03-10 DIAGNOSIS — R109 Unspecified abdominal pain: Secondary | ICD-10-CM | POA: Insufficient documentation

## 2014-03-10 DIAGNOSIS — R1084 Generalized abdominal pain: Secondary | ICD-10-CM | POA: Insufficient documentation

## 2014-03-10 LAB — CBC WITH DIFFERENTIAL/PLATELET
Basophils Absolute: 0 10*3/uL (ref 0.0–0.1)
Basophils Relative: 1 % (ref 0–1)
Eosinophils Absolute: 0.1 10*3/uL (ref 0.0–0.7)
Eosinophils Relative: 2 % (ref 0–5)
HCT: 41 % (ref 39.0–52.0)
Hemoglobin: 14.7 g/dL (ref 13.0–17.0)
Lymphocytes Relative: 48 % — ABNORMAL HIGH (ref 12–46)
Lymphs Abs: 2.1 10*3/uL (ref 0.7–4.0)
MCH: 31.4 pg (ref 26.0–34.0)
MCHC: 35.9 g/dL (ref 30.0–36.0)
MCV: 87.6 fL (ref 78.0–100.0)
Monocytes Absolute: 0.3 10*3/uL (ref 0.1–1.0)
Monocytes Relative: 7 % (ref 3–12)
Neutro Abs: 1.7 10*3/uL (ref 1.7–7.7)
Neutrophils Relative %: 42 % — ABNORMAL LOW (ref 43–77)
Platelets: 150 10*3/uL (ref 150–400)
RBC: 4.68 MIL/uL (ref 4.22–5.81)
RDW: 13.2 % (ref 11.5–15.5)
WBC: 4.2 10*3/uL (ref 4.0–10.5)

## 2014-03-10 LAB — COMPREHENSIVE METABOLIC PANEL
ALT: 11 U/L (ref 0–53)
AST: 17 U/L (ref 0–37)
Albumin: 3.9 g/dL (ref 3.5–5.2)
Alkaline Phosphatase: 58 U/L (ref 39–117)
Anion gap: 13 (ref 5–15)
BUN: 13 mg/dL (ref 6–23)
CO2: 24 mEq/L (ref 19–32)
Calcium: 9.3 mg/dL (ref 8.4–10.5)
Chloride: 102 mEq/L (ref 96–112)
Creatinine, Ser: 0.98 mg/dL (ref 0.50–1.35)
GFR calc Af Amer: >90 mL/min (ref >90)
GFR calc non Af Amer: >90 mL/min (ref >90)
Glucose, Bld: 96 mg/dL (ref 70–99)
Potassium: 4 mEq/L (ref 3.7–5.3)
Sodium: 139 mEq/L (ref 137–147)
Total Bilirubin: 0.5 mg/dL (ref 0.3–1.2)
Total Protein: 7.2 g/dL (ref 6.0–8.3)

## 2014-03-10 LAB — URINALYSIS, ROUTINE W REFLEX MICROSCOPIC
Bilirubin Urine: NEGATIVE
Glucose, UA: NEGATIVE mg/dL
Hgb urine dipstick: NEGATIVE
Ketones, ur: NEGATIVE mg/dL
Leukocytes, UA: NEGATIVE
Nitrite: NEGATIVE
Protein, ur: NEGATIVE mg/dL
Specific Gravity, Urine: 1.011 (ref 1.005–1.030)
Urobilinogen, UA: 1 mg/dL (ref 0.0–1.0)
pH: 7 (ref 5.0–8.0)

## 2014-03-10 LAB — LIPASE, BLOOD: Lipase: 44 U/L (ref 11–59)

## 2014-03-10 NOTE — ED Provider Notes (Signed)
CSN: [id number]     Arrival date & time 03/10/14  1720 History   First MD Initiated Contact with Patient 03/10/14 2322     Chief Complaint  Patient presents with  . Abdominal Pain  . Back Pain   (Consider location/radiation/quality/duration/timing/severity/associated sxs/prior Treatment) HPI Steve Anderson is a 53 yo male presenting with abd pain x 5 days.  He states he was walking and began to have abd cramping and leg weakness.  He felt like he needed to have a bowel movement.  He drank some ginger ale and that improved the cramping some. He reports he had a loose bowel movement.  He hasn't had any more loose bowel movements, but he continues to have intermittent abd cramping he rates 8/10.  He notices the pain is worse after eating.  He answered affirmatively when asked about chest pain and reports it just started when he ws sitting in the waiting room.  When asked to put where his finger where his chest hurts he rubs his epigastrium. He denies back pain to me.  He denies fever, nausea, or vomiting.  Past Medical History  Diagnosis Date  . Chronic pain    Past Surgical History  Procedure Laterality Date  . Orthopedic surgery     No family history on file. History  Substance Use Topics  . Smoking status: Current Every Day Smoker -- 0.50 packs/day    Types: Cigarettes  . Smokeless tobacco: Not on file  . Alcohol Use: 4.2 oz/week    7 Cans of beer per week     Comment: daily    Review of Systems  Constitutional: Negative for fever and chills.  HENT: Negative for sore throat.   Eyes: Negative for visual disturbance.  Respiratory: Negative for cough and shortness of breath.   Cardiovascular: Negative for chest pain and leg swelling.  Gastrointestinal: Positive for abdominal pain and diarrhea. Negative for nausea and vomiting.  Genitourinary: Negative for dysuria and testicular pain.  Musculoskeletal: Negative for myalgias.  Skin: Negative for rash.  Neurological: Negative for  weakness, numbness and headaches.    Allergies  Review of patient's allergies indicates no known allergies.  Home Medications   Prior to Admission medications   Medication Sig Start Date End Date Taking? Authorizing Provider  ibuprofen (ADVIL,MOTRIN) 800 MG tablet Take 1 tablet (800 mg total) by mouth 3 (three) times daily. 12/21/13   Janene Harvey Palmer, PA-C   BP 143/76  Pulse 70  Temp(Src) 98.3 F (36.8 C) (Oral)  Resp 16  Wt 209 lb (94.802 kg)  SpO2 100% Physical Exam  Nursing note and vitals reviewed. Constitutional: He appears well-developed and well-nourished. No distress.  HENT:  Head: Normocephalic and atraumatic.  Mouth/Throat: Oropharynx is clear and moist. No oropharyngeal exudate.  Eyes: Conjunctivae are normal.  Neck: Neck supple. No thyromegaly present.  Cardiovascular: Normal rate, regular rhythm, S1 normal, S2 normal and intact distal pulses.  Exam reveals no gallop and no friction rub.   No murmur heard. Pulmonary/Chest: Effort normal and breath sounds normal. No respiratory distress. He has no wheezes. He has no rales. He exhibits no tenderness.  Abdominal: Soft. Normal appearance. There is no hepatosplenomegaly. There is generalized tenderness. There is no rigidity, no rebound, no guarding, no CVA tenderness, no tenderness at McBurney's point and negative Murphy's sign.  Musculoskeletal: He exhibits no tenderness.  Lymphadenopathy:    He has no cervical adenopathy.  Neurological: He is alert.  Skin: Skin is warm and dry. No rash  noted. He is not diaphoretic.  Psychiatric: He has a normal mood and affect.    ED Course  Procedures (including critical care time) Labs Review Labs Reviewed  CBC WITH DIFFERENTIAL - Abnormal; Notable for the following:    Neutrophils Relative % 42 (*)    Lymphocytes Relative 48 (*)    All other components within normal limits  URINALYSIS, ROUTINE W REFLEX MICROSCOPIC - Abnormal; Notable for the following:    APPearance CLOUDY  (*)    All other components within normal limits  COMPREHENSIVE METABOLIC PANEL  LIPASE, BLOOD   Imaging Review No results found.   EKG Interpretation None      MDM   Final diagnoses:  Generalized abdominal pain   53 yo male with abd pain, multiple evals and CT scans for abd pain without findings. Pt is in no acute distress, pleasant and appaers comfortable.  Protocol labs drawn in triage, CBC, CMP, Lipase and UA without significant abnormality.  Pt reports relief of pain after GI cocktail and IM toradol.  On repeat exam pt does not have any signs of peritonitis or surgical abdomen.  I doubt appendicitis, bowel obstruction, bowel perforation, cholecystitis, or diverticulitis. Pt appears safe to be discharged.  Discharge instructions include smptomatic management, and resources for follow-up with a primary care physician. Patient verbalizes understanding and agrees with plan.  Return precautions provided.  Case discussed with Dr. Ranae Palms.  Filed Vitals:   03/11/14 0000 03/11/14 0015 03/11/14 0045 03/11/14 0100  BP: 120/75 120/78 119/83 114/79  Pulse: 52 55 52 56  Temp:      TempSrc:      Resp: Weight:      SpO2: 99% 98% 99% 99%   Meds given in ED:  Medications  gi cocktail (Maalox,Lidocaine,Donnatal) (30 mLs Oral Given 03/11/14 0028)  ketorolac (TORADOL) injection 60 mg (60 mg Intramuscular Given 03/11/14 0028)    Discharge Medication List as of 03/11/2014 12:53 AM         Harle Battiest, NP 03/14/14 [phone]

## 2014-03-10 NOTE — ED Notes (Signed)
Pt reports lower abdominal pain and lower back pain for 4 days. Denies n/v/d. Is a Education administrator, does heavy lifting. Denies any urinary/bowel issues. Is a x 4.

## 2014-03-11 MED ORDER — GI COCKTAIL ~~LOC~~
30.0000 mL | Freq: Once | ORAL | Status: AC
  Administered 2014-03-11: 30 mL via ORAL
  Filled 2014-03-11: qty 30

## 2014-03-11 MED ORDER — KETOROLAC TROMETHAMINE 60 MG/2ML IM SOLN
60.0000 mg | Freq: Once | INTRAMUSCULAR | Status: AC
  Administered 2014-03-11: 60 mg via INTRAMUSCULAR
  Filled 2014-03-11: qty 2

## 2014-03-11 NOTE — Discharge Instructions (Signed)
Please follow the directions provided.  Be sure to establish care with a primary care provider to help follow your abdominal pain.  You may take mylanta for this abdominal pain if it returns.  Start with a clear liquid diet for right now and advance to your normal diet as your abdominal pain improves.    Abdominal (belly) pain can be caused by many things. Your caregiver performed an examination and possibly ordered blood/urine tests and imaging (CT scan, x-rays, ultrasound). Many cases can be observed and treated at home after initial evaluation in the emergency department. Even though you are being discharged home, abdominal pain can be unpredictable. Therefore, you need a repeated exam if your pain does not resolve, returns, or worsens. Most patients with abdominal pain don't have to be admitted to the hospital or have surgery, but serious problems like appendicitis and gallbladder attacks can start out as nonspecific pain. Many abdominal conditions cannot be diagnosed in one visit, so follow-up evaluations are very important.  SEEK IMMEDIATE MEDICAL ATTENTION IF: The pain does not go away or becomes severe.  A temperature above 101 develops.  Repeated vomiting occurs (multiple episodes).  The pain becomes localized to portions of the abdomen. The right side could possibly be appendicitis. In an adult, the left lower portion of the abdomen could be colitis or diverticulitis.  Blood is being passed in stools or vomit (bright red or black tarry stools).  Return also if you develop chest pain, difficulty breathing, dizziness or fainting, or become confused, poorly responsive, or inconsolable (young children).   Emergency Department Resource Guide 1) Find a Doctor and Pay Out of Pocket Although you won't have to find out who is covered by your insurance plan, it is a good idea to ask around and get recommendations. You will then need to call the office and see if the doctor you have chosen will accept  you as a new patient and what types of options they offer for patients who are self-pay. Some doctors offer discounts or will set up payment plans for their patients who do not have insurance, but you will need to ask so you aren't surprised when you get to your appointment.  2) Contact Your Local Health Department Not all health departments have doctors that can see patients for sick visits, but many do, so it is worth a call to see if yours does. If you don't know where your local health department is, you can check in your phone book. The CDC also has a tool to help you locate your state's health department, and many state websites also have listings of all of their local health departments.  3) Find a Walk-in Clinic If your illness is not likely to be very severe or complicated, you may want to try a walk in clinic. These are popping up all over the country in pharmacies, drugstores, and shopping centers. They're usually staffed by nurse practitioners or physician assistants that have been trained to treat common illnesses and complaints. They're usually fairly quick and inexpensive. However, if you have serious medical issues or chronic medical problems, these are probably not your best option.  No Primary Care Doctor: - Call Health Connect at  [phone] - they can help you locate a primary care doctor that  accepts your insurance, provides certain services, etc. - Physician Referral Service- [phone]  Chronic Pain Problems: Organization         Address  Phone   Notes  [facility]  [phone] Patients need to be referred by their primary care doctor.   Medication Assistance: Organization         Address  Phone   Notes  [facility] Medication [facility] [address] [place] [place], [place] [address] [phone] --Must be a resident of [facility] -- Must have NO insurance coverage whatsoever (no Medicaid/ Medicare, etc.) -- The pt. MUST  have a primary care doctor that directs their care regularly and follows them in the community   Med[facility]ist  [phone]   [employer]  [phone]    Agencies that provide inexpensive medical care: Organization         Address  Phone   Notes  [facility]  [phone]   [facility]    [phone]   [facility] [address] [place], [place] [address] [phone]   [facility] [address] [place], [place] [phone]   Planned Parenthood    [phone]   [facility]    [phone]   [facility]  [address], [place] Phone:  [phone], Fax:  [phone] Hours of Operation:  9 am - 6 pm, M-F.  Also accepts Medicaid/Medicare and self-pay.  [facility]  [address], [place] Phone: [phone], Fax: [phone]. Hours of Operation:  8:30 am - 5:30 pm, M-F.  Also accepts Medicaid and self-pay.  [facility] [place] [address], [place] Phone: [phone]   [facility] [address] Natasha Bence [place], [place] [phone], Ext. [phone] Mondays & Thursdays: 7-9 AM.  First 15 patients are seen on a first come, first serve basis.    Medicaid-accepting [facility] Providers:  Organization         Address  Phone   Notes  [facility] [address], [place] [phone] Also accepts self-pay patients.  [facility] [address] Laurell Josephs [place], [place]  [phone]   [facility] [address], [place] [phone]   [facility] [address], [place] [phone]   Renaye Rakers [address], [place]   [phone] Only accepts [place] Access [place] patients after they have their name applied to their card.   Self-Pay (no insurance) in [facility]:  Organization         Address  Phone   Notes  Sickle Cell Patients, [facility] [address] [place], [place] [phone]   [facility] [address] [place], [place] [phone]   [facility] [place]  [address], [place] [phone]   [facility]/Dr. Osei-Bonsu  [address] [place] or [address], [place] [phone] Phone number for both [place] and [place] locations is the same.  [facility] [address] [place] [phone]   [facility] [address] [place] or [address] [phone]   [facility] [address] [place] [phone], phone; [phone], fax Sees patients 1st and 3rd Saturday of every month.  Must not qualify for public or private insurance (i.e. Medicaid, Medicare, [place] Health Choice,  Veterans' Benefits)  Household income should be no more than 200% of the poverty level The clinic cannot treat you if you are pregnant or think you are pregnant  Sexually transmitted diseases are not treated at the clinic.    Dental Care: Organization         Address  Phone  Notes  [facility] Department of [facility] [place] [phone] Accepts children up to age 12 who are enrolled in [place] or [place]; pregnant women with a Medicaid card; and children who have applied for Medicaid or [place] Health Choice, but were declined, whose parents can pay a reduced fee at time of service.  [facility] Department of [facility]  [address], [place] [phone] Accepts children up to age 70 who are enrolled in [place] or [place]; pregnant women with a Medicaid card; and children who have applied for Medicaid or [place] Health Choice, but were declined, whose parents can  pay a reduced fee at time of service.  [facility]  [place] [phone] Patients are seen by appointment only. Walk-ins are not accepted. [place] will see patients 34 years of age and older. Monday - Tuesday (8am-5pm) Most Wednesdays (8:30-5pm) $30 per visit, cash only  [facility]  [address], [facility] [phone] Patients are seen by appointment only. Walk-ins are not accepted. [place] will see patients 40 years of age and older. One Wednesday Evening (Monthly: Volunteer Based).  $30 per visit, cash only  [place]  [phone] for adults; Children under age 59, call Graduate Pediatric Dentistry at [phone]. Children aged 70-14, please call [phone] to request a pediatric application.  Dental services are provided in all areas of dental care including fillings, crowns and bridges, complete and partial dentures, implants, gum treatment, root canals, and extractions. Preventive care is also provided. Treatment is provided to both adults and children. Patients are selected via a lottery and there is often a waiting list.   [facility] [address], [place]  [phone] [email or web address]   [facility] [address] [place], [place] [phone], Ext. [phone] Second and Fourth Thursday of each month, opens at 6:30 AM; Clinic ends at 9 AM.  Patients are seen on a first-come first-served basis, and a limited number are seen during each clinic.   [facility]  [address] Hillard Danker [place], [place] [phone]   Eligibility Requirements You must have lived in [place], [place], or [place] counties for at least the last three months.   You cannot be eligible for state or federal sponsored [employer], including [employer], [place], or [employer].   You generally cannot be eligible for healthcare  insurance through your employer.    How to apply: Eligibility screenings are held every Tuesday and Wednesday afternoon from 1:00 pm until 4:00 pm. You do not need an appointment for the interview!  [facility] [address], [place], [place]   [place]  [place] Department  [place]  [phone]    Behavioral Health Resources in the Community: Intensive Outpatient Programs Organization         Address  Phone  Notes  [place] Martin. [place]  [address] [place], [place] [phone]   [facility] Outpatient [address] [place], [place]   ADS: Alcohol & Drug Svcs [address], [place], [place]   [place] [address]  [place], [place] or [phone]   Substance Abuse Resources Organization         Address  Phone  Notes  Alcohol [facility] Drug Services  [phone]   [place]  [phone]   [place]   Chinita Pester  [phone]   Residential & Outpatient Substance Abuse Program  [phone]   Psychological Services Organization         Address  Phone  Notes  [facility] [place]  [place]  [phone]   [place] [address] [place] or [phone]    Mobile Crisis Teams Organization         Address  Phone  Notes  [facility], Mobile Crisis Care Unit  [phone]   [facility]  [address] [place], [place]   [place] [address] [place] [phone]    Self-Help/Support Groups Organization         Address  Phone             Notes  [place]. of [place] - variety of support groups  [place] Call for more information  Narcotics Anonymous (NA),  [facility] [address], [employer] [place]  2 meetings at this location   Special educational needs teacher         Address  Phone  Notes  [facility] [place],    [place]  [phone]   [facility]  [address] [place] [phone], [place], [place]   [place], [place] [phone] Admissions: 8am-3pm M-F  Incentives Substance [place] [address],    [place], [place] [phone]   [facility] [address] [place], [place], [place]   [facility] [address],  [place], [place]   [facility] - Intensive Outpatient [address] Kristeen [address], [place], [place]   [facility] ([place] [place]  [place], [place] [phone] or [phone]   [facility] ([facility]S) [address] [place], [place] Accepts Medicaid  [facility] [place] [address],  [place] 1-4090971807 Substance Abuse/Addiction Treatment   [facility] Organization         Address  Phone  Notes  [facility]  [phone]   Domenic Schwab, PhD [address] Arlis [place], [place]   [phone] or [phone]   [facility] [place], [place] [phone]   [facility] [address], [place], [place] [phone] Insurance/Medicaid/sponsorship through [facility] and Families [address] [place]                                    [place], [place] [phone] [place] [address], [place] [phone]    Dr. Adele Schilder  [phone]   [facility] Dept. 1) [address] [place] 2)  [address] [place] 3)  [address], [place] [phone]  [phone]     [facility] Child Abuse Hotline 361-591-9977 or [phone] (After Hours)

## 2014-03-18 NOTE — ED Provider Notes (Signed)
Medical screening examination/treatment/procedure(s) were performed by non-physician practitioner and as supervising physician I was immediately available for consultation/collaboration.   EKG Interpretation None        Loren Raceravid Birtha Hatler, MD 03/18/14 0028

## 2014-03-31 ENCOUNTER — Encounter (HOSPITAL_COMMUNITY): Payer: Self-pay | Admitting: Emergency Medicine

## 2014-03-31 ENCOUNTER — Emergency Department (HOSPITAL_COMMUNITY)
Admission: EM | Admit: 2014-03-31 | Discharge: 2014-03-31 | Disposition: A | Discharge status: Home / Self Care | Payer: Self-pay | Attending: Emergency Medicine | Admitting: Emergency Medicine

## 2014-03-31 DIAGNOSIS — Z791 Long term (current) use of non-steroidal anti-inflammatories (NSAID): Secondary | ICD-10-CM | POA: Insufficient documentation

## 2014-03-31 DIAGNOSIS — G8929 Other chronic pain: Secondary | ICD-10-CM | POA: Insufficient documentation

## 2014-03-31 DIAGNOSIS — J069 Acute upper respiratory infection, unspecified: Secondary | ICD-10-CM | POA: Insufficient documentation

## 2014-03-31 DIAGNOSIS — Z720 Tobacco use: Secondary | ICD-10-CM | POA: Insufficient documentation

## 2014-03-31 MED ORDER — IBUPROFEN 400 MG PO TABS
400.0000 mg | ORAL_TABLET | Freq: Once | ORAL | Status: AC
  Administered 2014-03-31: 400 mg via ORAL
  Filled 2014-03-31: qty 1

## 2014-03-31 MED ORDER — IBUPROFEN 800 MG PO TABS: 800.0000 mg | ORAL_TABLET | Freq: Three times a day (TID) | ORAL | Status: DC

## 2014-03-31 MED ORDER — DM-GUAIFENESIN ER 30-600 MG PO TB12: 1.0000 | ORAL_TABLET | Freq: Two times a day (BID) | ORAL | Status: DC

## 2014-03-31 NOTE — ED Provider Notes (Signed)
CSN: [id number]     Arrival date & time 03/31/14  1858 History  This chart was scribed for non-physician practitioner Mellody DrownLauren Deshannon Hinchliffe, PA-C, working with Flint MelterElliott L Wentz, MD, by Yevette EdwardsAngela Bracken, ED Scribe. This patient was seen in room TR04C/TR04C and the patient's care was started at 8:53 PM.   First MD Initiated Contact with Patient 03/31/14 2038     Chief Complaint  Patient presents with  . Otalgia    HPI Comments: Steve Anderson is a 53 y.o. male who presents to the Emergency Department complaining of worsening bilateral otalgia which began 6 to 7 days ago. He reports mild drainage from the ears as well as a popping sound. The pt also endorses a cough, nasal congestion, a sore throat, and rhinorrhea as associated symptoms. He denies a fever or chills. Patient reports left ear discomfort increases with swallowing and lower nose.  Patient reports frontal headache, sinus pressure.   The history is provided by the patient. No language interpreter was used.    Past Medical History  Diagnosis Date  . Chronic pain    Past Surgical History  Procedure Laterality Date  . Orthopedic surgery     History reviewed. No pertinent family history. History  Substance Use Topics  . Smoking status: Current Every Day Smoker -- 0.50 packs/day    Types: Cigarettes  . Smokeless tobacco: Not on file  . Alcohol Use: 4.2 oz/week    7 Cans of beer per week     Comment: daily    Review of Systems  Constitutional: Negative for fever and chills.  HENT: Positive for congestion, ear pain, rhinorrhea and sore throat.   Respiratory: Positive for cough.   All other systems reviewed and are negative.   Allergies  Review of patient's allergies indicates no known allergies.  Home Medications   Prior to Admission medications   Medication Sig Start Date End Date Taking? Authorizing Provider  ibuprofen (ADVIL,MOTRIN) 800 MG tablet Take 1 tablet (800 mg total) by mouth 3 (three) times daily. 12/21/13   Jillyn LedgerJessica  K Palmer, PA-C   Triage Vitals: BP 115/80  Pulse 87  Temp(Src) 98.3 F (36.8 C) (Oral)  Resp 16  SpO2 98%  Physical Exam  Nursing note and vitals reviewed. Constitutional: He is oriented to person, place, and time. He appears well-developed and well-nourished. No distress.  HENT:  Head: Normocephalic and atraumatic.  Right Ear: Tympanic membrane and external ear normal. Tympanic membrane is not erythematous and not bulging.  Left Ear: Tympanic membrane and external ear normal. Tympanic membrane is not erythematous and not bulging.  Nose: Rhinorrhea present.  Mouth/Throat: Uvula is midline. No trismus in the jaw. Posterior oropharyngeal erythema present. No oropharyngeal exudate, posterior oropharyngeal edema or tonsillar abscesses.  Eyes: Conjunctivae and EOM are normal.  Neck: Neck supple.  Pulmonary/Chest: Effort normal. No respiratory distress.  Musculoskeletal: Normal range of motion.  Lymphadenopathy:       Head (right side): No submental, no submandibular, no tonsillar, no preauricular, no posterior auricular and no occipital adenopathy present.       Head (left side): No submental, no submandibular, no tonsillar, no preauricular, no posterior auricular and no occipital adenopathy present.       Right cervical: No superficial cervical adenopathy present.      Left cervical: No superficial cervical adenopathy present.  Neurological: He is alert and oriented to person, place, and time.  Skin: Skin is warm and dry.  Psychiatric: He has a normal mood and affect.  His behavior is normal.    ED Course  Procedures (including critical care time)  COORDINATION OF CARE:  9:01 PM- Discussed treatment plan with patient, and the patient agreed to the plan.   Labs Review Labs Reviewed - No data to display  Imaging Review No results found.   EKG Interpretation None      MDM   Final diagnoses:  URI (upper respiratory infection)   Patients symptoms are consistent with URI,  likely viral etiology. No sign of otitis externa or media on exam, no abnormalities in posterior oropharynx.  Discussed that antibiotics are not indicated for viral infections. Pt will be discharged with symptomatic treatment.  Verbalizes understanding and is agreeable with plan. Pt is hemodynamically stable & in NAD prior to dc.  Meds given in ED:  Medications - No data to display  New Prescriptions   DEXTROMETHORPHAN-GUAIFENESIN (MUCINEX DM) 30-600 MG PER 12 HR TABLET    Take 1 tablet by mouth 2 (two) times daily.   IBUPROFEN (ADVIL,MOTRIN) 800 MG TABLET    Take 1 tablet (800 mg total) by mouth 3 (three) times daily.   I personally performed the services described in this documentation, which was scribed in my presence. The recorded information has been reviewed and is accurate.    Mellody DrownLauren Shamyra Farias, PA-C 03/31/14 2122

## 2014-03-31 NOTE — ED Notes (Signed)
Declined W/C at D/C and was escorted to lobby by RN. 

## 2014-03-31 NOTE — Discharge Instructions (Signed)
Call for a follow up appointment with a Family or Primary Care Provider.  °Return if Symptoms worsen.   °Take medication as prescribed.  °Salt water gargles 3-4 times a day. °Drink plenty of fluids. ° ° ° °Emergency Department Resource Guide °1) Find a Doctor and Pay Out of Pocket °Although you won't have to find out who is covered by your insurance plan, it is a good idea to ask around and get recommendations. You will then need to call the office and see if the doctor you have chosen will accept you as a new patient and what types of options they offer for patients who are self-pay. Some doctors offer discounts or will set up payment plans for their patients who do not have insurance, but you will need to ask so you aren't surprised when you get to your appointment. ° °2) Contact Your Local Health Department °Not all health departments have doctors that can see patients for sick visits, but many do, so it is worth a call to see if yours does. If you don't know where your local health department is, you can check in your phone book. The CDC also has a tool to help you locate your state's health department, and many state websites also have listings of all of their local health departments. ° °3) Find a Walk-in Clinic °If your illness is not likely to be very severe or complicated, you may want to try a walk in clinic. These are popping up all over the country in pharmacies, drugstores, and shopping centers. They're usually staffed by nurse practitioners or physician assistants that have been trained to treat common illnesses and complaints. They're usually fairly quick and inexpensive. However, if you have serious medical issues or chronic medical problems, these are probably not your best option. ° °No Primary Care Doctor: °- Call [facility] at  832-8000 - they can help you locate a primary care doctor that  accepts your insurance, provides certain services, etc. °- Physician Referral Service-  1-800-533-3463 ° °Chronic Pain Problems: °Organization         Address  Phone   Notes  °[facility]  (336) 297-2271 Patients need to be referred by their primary care doctor.  ° °Medication Assistance: °Organization         Address  Phone   Notes  °[place] [facility] [address] °[place], [place] [address] (336) 641-8030 --Must be a resident of [place] °-- Must have NO insurance coverage whatsoever (no Medicaid/ Medicare, etc.) °-- The pt. MUST have a primary care doctor that directs their care regularly and follows them in the community °  °[facility]  (866) 331-1348   °[employer]  (888) 892-1162   ° °Agencies that provide inexpensive medical care: °Organization         Address  Phone   Notes  °[facility]  (336) 832-8035   ° Internal Medicine    (336) 832-7272   °[facility] [address] °[place], [place] [address] (336) 832-4777   °[facility] [address], °[place] (336) 271-4999   °[facility]    (336) 373-0678   °[facility]    (336) 272-1050   °[facility] ° [address], [place] Phone:  (336) 832-4444, Fax:  (336) 832-4440 Hours of Operation:  9 am - 6 pm, M-F.  Also accepts Medicaid/Medicare and self-pay.  °[facility] ° [address], [place] Phone: (336) 832-3150, Fax: (336) 832-3151. Hours of Operation:  8:30 am - 5:30 pm, M-F.  Also accepts Medicaid and self-pay.  °[facility] [place] [address], [place] Phone: (336) 878-6027   °[facility] [address], [place], [place] (336)723-1848, Ext. 123 Mondays & Thursdays: 7-9 AM.  First 15 patients are seen on a first come, first serve basis. °  ° °Medicaid-accepting [place] Providers: ° °Organization         Address  Phone   Notes  °[facility] [address],  [place] (336) 641-2100 Also accepts self-pay patients.  °[facility] [address], [place] ° (336) 856-9996   °[facility] [address], [place] (336) 288-8857   °[facility] [address] [place] [address], [place] (336) 299-7000   °Veita Bland [address], [place]  ° (336) 373-1557 Only accepts [place] Access Medicaid patients after they have their name applied to their card.  ° °Self-Pay (no insurance) in [place]: ° °Organization         Address  Phone   Notes  °Sickle Cell Patients, [facility] [address], [place] (336) 832-1970   °[facility] [address], [place] (336) 832-4400   °[facility] ° [address],  (336) 992-4800   °[facility]/Dr. Osei-Bonsu ° [address] [place] [address], [place] or [address], [place] (336) 841-8500 Phone number for both [place] and [place] locations is the same.  °[facility] [address], [place] (336) 299-0000   °[facility] [address] [place] [address], [place] or [address] (336) 852-7530 °(336) 878-2260   °[facility] [address], [place] (336) 350-1642, phone; (336) 294-5005, fax Sees patients 1st and 3rd Saturday of every month.  Must not qualify for public or private insurance (i.e. Medicaid, Medicare, [place] Health Choice, Veterans' Benefits) • Household income should be no more than 200% of the poverty level •The clinic cannot treat you if you are pregnant or think you are pregnant • Sexually transmitted diseases are not treated at the clinic.  ° ° °Dental Care: °Organization         Address  Phone  Notes  °[facility] [address], [place] (336) 641-6152 Accepts children up to age 21 who are enrolled in  Medicaid or [place] Health Choice; pregnant women with a Medicaid card; and children who have applied for Medicaid or [place] Health Choice, but were declined, whose parents can pay a reduced fee at time of service.  °[facility] [place]  [address], [place] (336) 641-7733 Accepts children up to age 21 who are enrolled in Medicaid or [place] Health Choice; pregnant women with a Medicaid card; and children who have applied for Medicaid or [place] Health Choice, but were declined, whose parents can pay a reduced fee at time of service.  °[facility] ° [address], [place] (336) 641-4533 Patients are seen by appointment only. Walk-ins are not accepted. [facility] will see patients 18 years of age and older. °Monday - Tuesday (8am-5pm) °Most Wednesdays (8:30-5pm) °$30 per visit,   cash only  °[facility] ° [address], [place] (336) 641-4533 Patients are seen by appointment only. Walk-ins are not accepted. [facility] will see patients 18 years of age and older. °One Wednesday Evening (Monthly: Volunteer Based).  $30 per visit, cash only  °[facility]  (919) 537-3737 for adults; Children under age 4, call Graduate Pediatric Dentistry at ([phone]) [phone]. Children aged 4-14, please call (919) [phone] to request a pediatric application. ° Dental services are provided in all areas of dental care including fillings, crowns and bridges, complete and partial dentures, implants, gum treatment, root canals, and extractions. Preventive care is also provided. Treatment is provided to both adults and children. °Patients are selected via a lottery and there is often a waiting list. °  °[facility] [address], °[place] ° (336) 763-8833 [email or web address] °  °[facility] [address], [place], [place] (336)723-1848, Ext. 123 Second and Fourth Thursday of each month, opens at 6:30  AM; Clinic ends at 9 AM.  Patients are seen on a first-come first-served basis, and a limited number are seen during each clinic.  ° °[facility] ° [address], [place], [place] (336) 723-7904   Eligibility Requirements °You must have lived in [place], [place], or [place] counties for at least the last three months. °  You cannot be eligible for state or federal sponsored healthcare insurance, including Veterans Administration, Medicaid, or Medicare. °  You generally cannot be eligible for healthcare insurance through your employer.  °  How to apply: °Eligibility screenings are held every Tuesday and Wednesday afternoon from 1:00 pm until 4:00 pm. You do not need an appointment for the interview!  °[facility] [address], [place], [place] 336-631-2330   °[facility]  336-342-8273   °[facility]  336-703-3100   °[facility]  336-570-6415   ° °Behavioral Health Resources in the Community: °Intensive Outpatient Programs °Organization         Address  Phone  Notes  °[facility] [address], [place], [place] 336-878-6098   °[facility] [address], [place], [place] 336-832-9800   °[facility] [address], [place], [place] ° 336-882-2125   °[facility] [address],  °[place], [place] 1-800-853-5163 or 336-641-4981   °Substance Abuse Resources °Organization         Address  Phone  Notes  °[facility]  336-882-2125   °[facility]  336-784-9470   °[facility]  336-285-9073   °[facility]  336-845-3988   °Residential & Outpatient Substance Abuse Program  1-800-659-3381   °Psychological Services °Organization         Address  Phone  Notes  ° Health  336- 832-9600   °[facility]  336- 378-7881   °[facility] [address], [place] 1-800-853-5163 or  336-641-4981   ° °Mobile Crisis Teams °Organization         Address  Phone  Notes  °[facility], Mobile Crisis Care Unit  1-877-626-1772   °Assertive °[facility] ° [address] [place], [place] 336-834-9664   °Sharon DeEsch [address] °[place] 336-554-5454   ° °Self-Help/Support Groups °Organization         Address  Phone               Notes  °[facility] - variety of support groups  336- 373-1402 Call for more information  °Narcotics Anonymous (NA), [facility] [address], °[place]  2 meetings at this location  ° °Residential Treatment Programs °Organization         Address  Phone  Notes  °[facility] [address],    °[place]  1-866-801-8205   °[facility] ° [address], [place], [place] 704-293-8524   °[facility] [address], [place] 336-845-3988 Admissions: 8am-3pm M-F  °[facility] [address]    °[place], [place] 336-841-1104   °[facility] [address], [place], [place] 336-379-7146   °[facility] [address]  °[place], [place] 336-285-9073   °[facility] - Intensive Outpatient [address], [place], [place] 336-852-3033   °[facility] [address]  °[place], [place] 1-877-615-2722 or 336-784-9470   °[facility] [address] [place], [place] 336-227-7417 Accepts Medicaid  °[facility] [address]  °[place] 1-800-659-3381 Substance Abuse/Addiction Treatment  ° °[place] Behavioral Health Resources °Organization         Address  Phone  Notes  °[facility]  (888) 581-9988   °Julie Brannon, PhD [address] [place], [place]   (336) 349-5553 or (336) 951-0000   °[facility]   [address] °[place], [place] (336) 349-4454   °[facility] [address],  [place],  (336) 342-8316 Insurance/Medicaid/sponsorship through [facility]  °[facility] [address]                                    [place],  (336) 342-8316 Therapy/tele-psych/case  °[facility] [address]  ° [place],  (336) 349-2233    °Dr. Arfeen  (336) 349-4544   °[facility] [place]  [employer] [place] [facility] 1) [address],  °2) [address], [place] °3)  [address], [place] (336) 349-3220 °(336) 342-7768 ° °(336) 342-8140   °[place] [facility] (336) 342-1394 or (336) 342-3537 (After Hours)    ° °

## 2014-03-31 NOTE — ED Notes (Signed)
Patient arrives with complaint of left ear fullness and pain. States that it began about 5 days ago and has gradually gotten worse. Endorses some drainage from ear. States that the drainage has not appeared purulent or bloody. Denies fever. States previous history of issues with ear infections.

## 2014-04-01 NOTE — ED Provider Notes (Signed)
Medical screening examination/treatment/procedure(s) were performed by non-physician practitioner and as supervising physician I was immediately available for consultation/collaboration.  Flint MelterElliott L Pragya Lofaso, MD 04/01/14 Moses Manners0025

## 2014-05-04 ENCOUNTER — Encounter (HOSPITAL_COMMUNITY): Payer: Self-pay | Admitting: *Deleted

## 2014-05-04 ENCOUNTER — Emergency Department (HOSPITAL_COMMUNITY)
Admission: EM | Admit: 2014-05-04 | Discharge: 2014-05-04 | Disposition: A | Discharge status: Home / Self Care | Payer: Self-pay | Attending: Emergency Medicine | Admitting: Emergency Medicine

## 2014-05-04 ENCOUNTER — Emergency Department (HOSPITAL_COMMUNITY): Payer: Self-pay

## 2014-05-04 DIAGNOSIS — T17208A Unspecified foreign body in pharynx causing other injury, initial encounter: Secondary | ICD-10-CM

## 2014-05-04 DIAGNOSIS — Z791 Long term (current) use of non-steroidal anti-inflammatories (NSAID): Secondary | ICD-10-CM | POA: Insufficient documentation

## 2014-05-04 DIAGNOSIS — T17298A Other foreign object in pharynx causing other injury, initial encounter: Secondary | ICD-10-CM | POA: Insufficient documentation

## 2014-05-04 DIAGNOSIS — R0989 Other specified symptoms and signs involving the circulatory and respiratory systems: Secondary | ICD-10-CM

## 2014-05-04 DIAGNOSIS — Y9289 Other specified places as the place of occurrence of the external cause: Secondary | ICD-10-CM | POA: Insufficient documentation

## 2014-05-04 DIAGNOSIS — Y9389 Activity, other specified: Secondary | ICD-10-CM | POA: Insufficient documentation

## 2014-05-04 DIAGNOSIS — X58XXXA Exposure to other specified factors, initial encounter: Secondary | ICD-10-CM | POA: Insufficient documentation

## 2014-05-04 DIAGNOSIS — Z720 Tobacco use: Secondary | ICD-10-CM | POA: Insufficient documentation

## 2014-05-04 DIAGNOSIS — G8929 Other chronic pain: Secondary | ICD-10-CM | POA: Insufficient documentation

## 2014-05-04 DIAGNOSIS — Y998 Other external cause status: Secondary | ICD-10-CM | POA: Insufficient documentation

## 2014-05-04 DIAGNOSIS — Z79899 Other long term (current) drug therapy: Secondary | ICD-10-CM | POA: Insufficient documentation

## 2014-05-04 MED ORDER — GI COCKTAIL ~~LOC~~
30.0000 mL | Freq: Once | ORAL | Status: AC
  Administered 2014-05-04: 30 mL via ORAL
  Filled 2014-05-04: qty 30

## 2014-05-04 MED ORDER — PANTOPRAZOLE SODIUM 20 MG PO TBEC: 20.0000 mg | DELAYED_RELEASE_TABLET | Freq: Every day | ORAL | Status: DC

## 2014-05-04 NOTE — ED Notes (Signed)
Pt in c/o sore throat over the last few days, denies other symptoms, no distress noted

## 2014-05-04 NOTE — Discharge Instructions (Signed)
Return to the emergency room with worsening of symptoms, new symptoms or with symptoms that are concerning, especially drooling, unable to swallow liquids, difficulty breathing, shortness of breath, sensation of throat tightness, fevers, chest pain or black stools. OTC omeprazole 20mg  once daily for 14 days OR protonix script provided. Whichever is cheaper. Follow up with wellness center with persistent symptoms.  Please call your doctor for a followup appointment within 24-48 hours. When you talk to your doctor please let them know that you were seen in the emergency department and have them acquire all of your records so that they can discuss the findings with you and formulate a treatment plan to fully care for your new and ongoing problems. If you do not have a primary care provider please call the number below under ED resources to establish care with a provider and follow up.    Emergency Department Resource Guide 1) Find a Doctor and Pay Out of Pocket Although you won't have to find out who is covered by your insurance plan, it is a good idea to ask around and get recommendations. You will then need to call the office and see if the doctor you have chosen will accept you as a new patient and what types of options they offer for patients who are self-pay. Some doctors offer discounts or will set up payment plans for their patients who do not have insurance, but you will need to ask so you aren't surprised when you get to your appointment.  2) Contact Your Local Health Department Not all health departments have doctors that can see patients for sick visits, but many do, so it is worth a call to see if yours does. If you don't know where your local health department is, you can check in your phone book. The CDC also has a tool to help you locate your state's health department, and many state websites also have listings of all of their local health departments.  3) Find a Walk-in Clinic If your  illness is not likely to be very severe or complicated, you may want to try a walk in clinic. These are popping up all over the country in pharmacies, drugstores, and shopping centers. They're usually staffed by nurse practitioners or physician assistants that have been trained to treat common illnesses and complaints. They're usually fairly quick and inexpensive. However, if you have serious medical issues or chronic medical problems, these are probably not your best option.  No Primary Care Doctor: - Call Health Connect at  [phone] - they can help you locate a primary care doctor that  accepts your insurance, provides certain services, etc. - Physician Referral Service- [phone]  Chronic Pain Problems: Organization         Address  Phone   Notes  [facility]  [phone] Patients need to be referred by their primary care doctor.   Medication Assistance: Organization         Address  Phone   Notes  [facility] [address] [place], [place] [phone] --Must be a resident of [place] [facility] [place] -- Must have NO insurance coverage whatsoever (no Medicaid/ Medicare, etc.) -- The pt. MUST have a primary care doctor that directs their care regularly and follows them in the community   MedAssist  [phone]   [employer]  [phone]    Agencies that provide inexpensive medical care: Organization  Address  Phone   Notes  [place]  [phone]   [facility]    [phone]   [facility] [place], [place] [address] [phone]   [place] [address] [place] [place] [phone]   Planned Parenthood    [phone]   [facility]    [phone]   [address], [place] Phone:  [phone], Fax:  [phone] Hours of Operation:   9 am - 6 pm, M-F.  Also accepts Medicaid/Medicare and self-pay.  [facility] for [place], [address], [place] Phone: [phone], Fax: [phone]. Hours of Operation:  8:30 am - 5:30 pm, M-F.  Also accepts Medicaid and self-pay.  [facility] [place] [address] [place] Phone: [phone]   [place], [place], [place] [phone], Ext. [phone] Mondays & Thursdays: 7-9 AM.  First 15 patients are seen on a first come, first serve basis.    Marty Providers:  Organization         Address  Phone   Notes  [facility] [address], [place] [phone] Also accepts self-pay patients.  [facility] [address], [place]  [phone]   [place], [address], [place] [phone]   [facility] [address] [place] [phone]   Lucianne Lei [address], [place]   [phone] Only accepts [place] Access [place] patients after they have their name applied to their card.   Self-Pay (no insurance) in [facility]:  Organization         Address  Phone   Notes  Sickle Cell Patients, [facility] [place] [phone]   [facility] [place] [phone]   [facility] [place]  [place], [place], [place] [phone]   [facility]/Dr. Osei-Bonsu  [address], [place] or [address], [place] [phone] Phone number for both [place] and [place] locations is the same.  Urgent Medical and [facility] [address] [place] [phone]   [facility] [address] [place] or [address] [phone]   [facility] [address], [place] [phone], phone; [phone], fax Sees patients 1st and 3rd Saturday of every month.  Must not qualify for public or private insurance (i.e. Medicaid, Medicare, [place] Health Choice, Veterans' Benefits)  Household income should be no more than 200% of the poverty level The clinic cannot treat you if you are pregnant or think you are pregnant  Sexually transmitted diseases are not treated at the clinic.    Dental Care: Organization         Address  Phone  Notes  [facility] Department of [facility] [place] [phone] Accepts children up to age 107 who are enrolled in [place] or [place]; pregnant women with a Medicaid card; and children who have applied for Medicaid or [place] Health Choice, but were declined, whose parents can pay a reduced fee at time  of service.  [facility] Department of [facility]  [address], [place] [phone] Accepts children up to age 42 who are enrolled in [place] or [place]; pregnant women with a Medicaid card; and children who have applied for Medicaid or [place] Health Choice, but were declined, whose parents can pay a reduced fee at time of service.  [place] Access PROGRAM  [place] [phone] Patients are seen by appointment only. Walk-ins are not accepted. [place] will see patients 79 years of age and older. Monday - Tuesday (8am-5pm) Most Wednesdays (8:30-5pm) $30 per visit, cash only  [facility] Adult Dental Access PROGRAM  [address], [facility] [phone] Patients are seen by appointment only. Walk-ins are not accepted. [place] will see patients 47 years of age and older. One Wednesday Evening (Monthly: Volunteer Based).  $30 per visit, cash only  [place]  [phone] for adults; Children under age 32, call Graduate Pediatric Dentistry at  [phone]. Children aged 17-14, please call [phone] to request a pediatric application.  Dental services are provided in all areas of dental care including fillings, crowns and bridges, complete and partial dentures, implants, gum treatment, root canals, and extractions. Preventive care is also provided. Treatment is provided to both adults and children. Patients are selected via a lottery and there is often a waiting list.   [facility] [address], [place]  [phone] [email or web address]   [facility] [address] [place], [place] [phone], Ext. [phone] Second and Fourth Thursday of each month, opens at 6:30 AM; Clinic ends at 9 AM.  Patients are seen on a first-come first-served basis, and a limited number are seen during each clinic.   [facility]  [address] Hillard Danker [place], [place] [phone]   Eligibility Requirements You must have lived in [place], [place], or [place] counties for at least the last three months.   You cannot be eligible for state or federal sponsored [employer], including [employer], [place], or [employer].   You generally cannot be eligible for healthcare insurance through your employer.    How to apply: Eligibility screenings are held every Tuesday and Wednesday afternoon from 1:00 pm until 4:00 pm. You do not need an appointment for the interview!  [facility] [address], [place], [place]   [place]  Minor Department  [place]  [phone]    Behavioral Health Resources in the Community: Intensive Outpatient Programs Organization         Address  Phone  Notes  [place]. [address] [place], [place] [phone]   [facility] Outpatient [address], [place], [place]   ADS: Alcohol &  Drug Svcs [address], [place], [place]   [place] [address]  [place], [place] or [phone]   Substance Abuse Resources Organization         Address  Phone  Notes  Alcohol and Drug Services  [phone]   [place]  [phone]   [facility] [place]   Chinita Pester  [phone]   Residential & Outpatient Substance Abuse Program  [phone]   Psychological Services Organization         Address  Phone  Notes  [facility]  [place]  [phone]   [place] [address] [place] or [phone]    Mobile Crisis Teams Organization         Address  Phone  Notes  [facility], Mobile Crisis Care Unit  [phone]   [facility]  [address] [place], [place]   [place] [address] [place] [phone]    Self-Help/Support Groups Organization         Address  Phone             Notes  [place]. of [place] - variety of support groups  [place] Call for more information  Narcotics Anonymous (NA), [facility] [address], [employer] [place]  2 meetings at this location   Special educational needs teacher         Address  Phone  Notes  [facility]idential [facility] [place],    [place]  [phone]   [facility]  [address] [place] [address], [place], [place]   [place], [place] [phone] Admissions: 8am-3pm M-F  Incentives Substance [place] [address]    [place], [place] [phone]   [facility] [address] [place], [place], [place]   [facility] [address],  [place], [place]   [facility] - Intensive Outpatient [address] Kristeen [address], [place], [place]   [facility] ([place].,  [place], [place] [phone] or [phone]   [facility] (RTS) [address] [place], [place] Accepts Medicaid  [facility] [place] [address]  [place] [phone] Substance Abuse/Addiction Treatment   [facility] Organization         Address  Phone  Notes  [facility]  [phone]   Domenic Schwab, PhD [address] Arlis Porta [place], [place]   [phone] or [phone]   [place], [place] [phone]   [facility] [address] [place], [place] [phone] Insurance/Medicaid/sponsorship through [facility] and Families [address], [address] [place]                                    [place], [place] [phone] [place] [address], [place] [phone]    Dr. Adele Schilder  [phone]   [facility] Dept. 1) [address] [place] 2) [place] 3)  [place] [address], [place] [phone]  [phone]   [place] [phone] or [phone] (After Hours)

## 2014-05-04 NOTE — ED Provider Notes (Signed)
CSN: [id number]     Arrival date & time 05/04/14  2020 History  This chart was scribed for non-physician practitioner, Oswaldo ConroyVictoria Deniah Saia, PA-C, working with Purvis SheffieldForrest Harrison, MD, by Modena JanskyAlbert Thayil, ED Scribe. This patient was seen in room TR11C/TR11C and the patient's care was started at 9:39 PM.  Chief Complaint  Patient presents with  . Sore Throat   The history is provided by the patient. No language interpreter was used.   HPI Comments: Steve Anderson is a 53 y.o. male who presents to the Emergency Department complaining of a sore throat that started 2-3 days. He states that he was eating french fries when the pain started. He reports that it hurts to swallow, but is able to eat and drink fluids. He denies drooling. He reports that he took no treatment PTA. He denies any new foods in his diet. He denies any difficulty breathing, chest pain, shortness of breath, nausea, emesis, abdominal pain, or rash or back pain. No fevers or chills.  Past Medical History  Diagnosis Date  . Chronic pain    Past Surgical History  Procedure Laterality Date  . Orthopedic surgery     History reviewed. No pertinent family history. History  Substance Use Topics  . Smoking status: Current Every Day Smoker -- 0.50 packs/day    Types: Cigarettes  . Smokeless tobacco: Not on file  . Alcohol Use: 4.2 oz/week    7 Cans of beer per week     Comment: daily    Review of Systems  HENT: Positive for sore throat.   Respiratory: Negative for shortness of breath.   Cardiovascular: Negative for chest pain.  Gastrointestinal: Negative for nausea, vomiting and abdominal pain.  Skin: Negative for rash.  All other systems reviewed and are negative.   Allergies  Review of patient's allergies indicates no known allergies.  Home Medications   Prior to Admission medications   Medication Sig Start Date End Date Taking? Authorizing Provider  dextromethorphan-guaiFENesin (MUCINEX DM) 30-600 MG per 12 hr tablet Take  1 tablet by mouth 2 (two) times daily. 03/31/14   Mellody DrownLauren Parker, PA-C  ibuprofen (ADVIL,MOTRIN) 800 MG tablet Take 1 tablet (800 mg total) by mouth 3 (three) times daily. 12/21/13   Jillyn LedgerJessica K Palmer, PA-C  ibuprofen (ADVIL,MOTRIN) 800 MG tablet Take 1 tablet (800 mg total) by mouth 3 (three) times daily. 03/31/14   Mellody DrownLauren Parker, PA-C  pantoprazole (PROTONIX) 20 MG tablet Take 1 tablet (20 mg total) by mouth daily. 05/04/14   Benetta SparVictoria L Amaira Safley, PA-C   BP 159/81 mmHg  Pulse 84  Temp(Src) 97.9 F (36.6 C) (Oral)  Resp 20  SpO2 100% Physical Exam  Constitutional: He appears well-developed and well-nourished. No distress.  HENT:  Head: Normocephalic and atraumatic.  Mouth/Throat: Mucous membranes are normal. No oropharyngeal exudate, posterior oropharyngeal edema or posterior oropharyngeal erythema.  No facial swelling. No swelling of the oropharnyx. No trismus. Pt tolerating secretions with ease.   Eyes: Conjunctivae and EOM are normal. Right eye exhibits no discharge. Left eye exhibits no discharge.  Neck: Normal range of motion. Neck supple.  No neck masses.   Cardiovascular: Normal rate, regular rhythm and normal heart sounds.   Pulmonary/Chest: Effort normal and breath sounds normal. No respiratory distress. He has no wheezes. He has no rales.  No respiratory distress.   Abdominal: Soft. Bowel sounds are normal. He exhibits no distension. There is no tenderness.  Neurological: He is alert.  Skin: Skin is warm and dry. He is  not diaphoretic.  Nursing note and vitals reviewed.   ED Course  Procedures (including critical care time) DIAGNOSTIC STUDIES: Oxygen Saturation is 100% on RA, normal by my interpretation.    COORDINATION OF CARE: 9:43 PM- Pt advised of plan for treatment which includes medication and pt agrees.  Labs Review Labs Reviewed - No data to display  Imaging Review Dg Neck Soft Tissue  05/04/2014   CLINICAL DATA:  Foreign body in throat, initial encounter,  patient thinks she has bread or potatoes stuck in throat  EXAM: NECK SOFT TISSUES - 1+ VIEW  COMPARISON:  CT cervical spine 05/26/2007  FINDINGS: Prevertebral soft tissues normal thickness.  Airway patent.  No radiopaque foreign body identified.  Degenerative disc disease changes at C4-C5 and C5-C6.  Epiglottis and aryepiglottic folds normal appearance.  IMPRESSION: No radiopaque foreign bodies identified.   Electronically Signed   By: Ulyses SouthwardMark  Boles M.D.   On: 05/04/2014 23:00     EKG Interpretation None      Meds given in ED:  Medications  gi cocktail (Maalox,Lidocaine,Donnatal) (30 mLs Oral Given 05/04/14 2211)    New Prescriptions   PANTOPRAZOLE (PROTONIX) 20 MG TABLET    Take 1 tablet (20 mg total) by mouth daily.      MDM   Final diagnoses:  Foreign body in throat, initial encounter  Foreign body sensation in throat   Pt with sensation of foreign body in his throat with painful swallowing. VSS. Patient handling secretions with ease. No facial neck oropharynx swelling. No masses. Lungs CTAB. No respiratory stress. Patient reported decrease of pain with GI cocktail. Neck xray without edema to larynx and no foreign bodies noted. tx with protonix or OTC omeprazole whichever is cheaper. maalox for symptomatic relief. Patient is afebrile, nontoxic, and in no acute distress. Patient is appropriate for outpatient management and is stable for discharge.  Discussed return precautions with patient. Discussed all results and patient verbalizes understanding and agrees with plan.  I personally performed the services described in this documentation, which was scribed in my presence. The recorded information has been reviewed and is accurate.  Case has been discussed with Dr. Romeo AppleHarrison who agrees with the above plan and to discharge.       Louann SjogrenVictoria L Gali Spinney, PA-C 05/04/14 2325  Purvis SheffieldForrest Harrison, MD 05/04/14 [phone]

## 2014-05-11 ENCOUNTER — Emergency Department (HOSPITAL_COMMUNITY)
Admission: EM | Admit: 2014-05-11 | Discharge: 2014-05-12 | Disposition: A | Discharge status: Home / Self Care | Payer: Self-pay | Attending: Emergency Medicine | Admitting: Emergency Medicine

## 2014-05-11 ENCOUNTER — Encounter (HOSPITAL_COMMUNITY): Payer: Self-pay | Admitting: Emergency Medicine

## 2014-05-11 DIAGNOSIS — Z720 Tobacco use: Secondary | ICD-10-CM | POA: Insufficient documentation

## 2014-05-11 DIAGNOSIS — R1084 Generalized abdominal pain: Secondary | ICD-10-CM | POA: Insufficient documentation

## 2014-05-11 DIAGNOSIS — R6883 Chills (without fever): Secondary | ICD-10-CM | POA: Insufficient documentation

## 2014-05-11 DIAGNOSIS — Z79899 Other long term (current) drug therapy: Secondary | ICD-10-CM | POA: Insufficient documentation

## 2014-05-11 DIAGNOSIS — R143 Flatulence: Secondary | ICD-10-CM | POA: Insufficient documentation

## 2014-05-11 DIAGNOSIS — G8929 Other chronic pain: Secondary | ICD-10-CM | POA: Insufficient documentation

## 2014-05-11 DIAGNOSIS — R300 Dysuria: Secondary | ICD-10-CM

## 2014-05-11 DIAGNOSIS — R112 Nausea with vomiting, unspecified: Secondary | ICD-10-CM | POA: Insufficient documentation

## 2014-05-11 DIAGNOSIS — R1012 Left upper quadrant pain: Secondary | ICD-10-CM

## 2014-05-11 DIAGNOSIS — R197 Diarrhea, unspecified: Secondary | ICD-10-CM

## 2014-05-11 DIAGNOSIS — Z791 Long term (current) use of non-steroidal anti-inflammatories (NSAID): Secondary | ICD-10-CM | POA: Insufficient documentation

## 2014-05-11 LAB — COMPREHENSIVE METABOLIC PANEL
ALT: 10 U/L (ref 0–53)
AST: 13 U/L (ref 0–37)
Albumin: 3.7 g/dL (ref 3.5–5.2)
Alkaline Phosphatase: 57 U/L (ref 39–117)
Anion gap: 10 (ref 5–15)
BUN: 12 mg/dL (ref 6–23)
CO2: 26 mEq/L (ref 19–32)
Calcium: 9.1 mg/dL (ref 8.4–10.5)
Chloride: 105 mEq/L (ref 96–112)
Creatinine, Ser: 0.98 mg/dL (ref 0.50–1.35)
GFR calc Af Amer: >90 mL/min (ref >90)
GFR calc non Af Amer: >90 mL/min (ref >90)
Glucose, Bld: 69 mg/dL — ABNORMAL LOW (ref 70–99)
Potassium: 3.7 mEq/L (ref 3.7–5.3)
Sodium: 141 mEq/L (ref 137–147)
Total Bilirubin: 0.4 mg/dL (ref 0.3–1.2)
Total Protein: 7 g/dL (ref 6.0–8.3)

## 2014-05-11 LAB — CBC WITH DIFFERENTIAL/PLATELET
Basophils Absolute: 0 10*3/uL (ref 0.0–0.1)
Basophils Relative: 0 % (ref 0–1)
Eosinophils Absolute: 0.1 10*3/uL (ref 0.0–0.7)
Eosinophils Relative: 1 % (ref 0–5)
HCT: 40.2 % (ref 39.0–52.0)
Hemoglobin: 13.9 g/dL (ref 13.0–17.0)
Lymphocytes Relative: 56 % — ABNORMAL HIGH (ref 12–46)
Lymphs Abs: 2.7 10*3/uL (ref 0.7–4.0)
MCH: 30.3 pg (ref 26.0–34.0)
MCHC: 34.6 g/dL (ref 30.0–36.0)
MCV: 87.6 fL (ref 78.0–100.0)
Monocytes Absolute: 0.2 10*3/uL (ref 0.1–1.0)
Monocytes Relative: 4 % (ref 3–12)
Neutro Abs: 1.9 10*3/uL (ref 1.7–7.7)
Neutrophils Relative %: 39 % — ABNORMAL LOW (ref 43–77)
Platelets: 156 10*3/uL (ref 150–400)
RBC: 4.59 MIL/uL (ref 4.22–5.81)
RDW: 12.7 % (ref 11.5–15.5)
WBC: 5 10*3/uL (ref 4.0–10.5)

## 2014-05-11 LAB — URINALYSIS, ROUTINE W REFLEX MICROSCOPIC
Bilirubin Urine: NEGATIVE
Glucose, UA: NEGATIVE mg/dL
Hgb urine dipstick: NEGATIVE
Ketones, ur: NEGATIVE mg/dL
Nitrite: NEGATIVE
Protein, ur: NEGATIVE mg/dL
Specific Gravity, Urine: 1.016 (ref 1.005–1.030)
Urobilinogen, UA: 0.2 mg/dL (ref 0.0–1.0)
pH: 5.5 (ref 5.0–8.0)

## 2014-05-11 LAB — LIPASE, BLOOD: Lipase: 67 U/L — ABNORMAL HIGH (ref 11–59)

## 2014-05-11 LAB — URINE MICROSCOPIC-ADD ON

## 2014-05-11 MED ORDER — SODIUM CHLORIDE 0.9 % IV BOLUS (SEPSIS)
1000.0000 mL | Freq: Once | INTRAVENOUS | Status: AC
  Administered 2014-05-11: 1000 mL via INTRAVENOUS

## 2014-05-11 MED ORDER — KETOROLAC TROMETHAMINE 30 MG/ML IJ SOLN
30.0000 mg | Freq: Once | INTRAMUSCULAR | Status: AC
Start: 2014-05-11 — End: 2014-05-11
  Administered 2014-05-11: 30 mg via INTRAVENOUS
  Filled 2014-05-11: qty 1

## 2014-05-11 NOTE — ED Provider Notes (Signed)
CSN: [id number]     Arrival date & time 05/11/14  2015 History   First MD Initiated Contact with Patient 05/11/14 2246     Chief Complaint  Patient presents with  . Abdominal Pain   Patient is a 53 y.o. male presenting with abdominal pain. The history is provided by the patient. No language interpreter was used.  Abdominal Pain Pain location:  LUQ and LLQ Pain quality: cramping   Pain radiates to:  Does not radiate Pain severity:  Moderate Onset quality:  Gradual Timing:  Intermittent Progression:  Waxing and waning Chronicity:  New Context: eating, recent illness and recent travel (all within [place])   Context: not alcohol use, not awakening from sleep, not diet changes, not laxative use, not medication withdrawal, not previous surgeries, not recent sexual activity, not retching, not sick contacts, not suspicious food intake and not trauma   Relieved by:  Nothing Worsened by:  Eating Ineffective treatments:  Belching, flatus and bowel activity Associated symptoms: chills, diarrhea, dysuria, flatus, nausea and vomiting (food product)   Associated symptoms: no anorexia, no belching, no chest pain, no constipation, no cough, no fatigue, no fever, no hematemesis, no hematochezia, no hematuria, no melena, no shortness of breath and no sore throat   Diarrhea:    Quality:  Watery   Number of occurrences:  3-4 per day   Severity:  Moderate   Duration:  2 days   Timing:  Intermittent   Progression:  Unchanged Dysuria:    Severity:  Moderate   Onset quality:  Gradual   Timing:  Intermittent   Progression:  Unchanged   Chronicity:  New Risk factors: NSAID use   Risk factors: no alcohol abuse, no aspirin use, not elderly, has not had multiple surgeries, not obese, not pregnant and no recent hospitalization      Past Medical History  Diagnosis Date  . Chronic pain    Past Surgical History  Procedure Laterality Date  . Orthopedic surgery     No family history on file. History   Substance Use Topics  . Smoking status: Current Every Day Smoker -- 0.50 packs/day    Types: Cigarettes  . Smokeless tobacco: Not on file  . Alcohol Use: Yes     Comment: daily    Review of Systems  Constitutional: Positive for chills. Negative for fever and fatigue.  HENT: Negative for sore throat.   Respiratory: Negative for cough and shortness of breath.   Cardiovascular: Negative for chest pain.  Gastrointestinal: Positive for nausea, vomiting (food product), abdominal pain, diarrhea and flatus. Negative for constipation, melena, hematochezia, anorexia and hematemesis.  Genitourinary: Positive for dysuria. Negative for hematuria.  All other systems reviewed and are negative.     Allergies  Review of patient's allergies indicates no known allergies.  Home Medications   Prior to Admission medications   Medication Sig Start Date End Date Taking? Authorizing Provider  pantoprazole (PROTONIX) 20 MG tablet Take 1 tablet (20 mg total) by mouth daily. 05/04/14  Yes Louann SjogrenVictoria L Creech, PA-C  dextromethorphan-guaiFENesin [facility](MUCINEX DM) 30-600 MG per 12 hr tablet Take 1 tablet by mouth 2 (two) times daily. Patient not taking: Reported on 05/11/2014 03/31/14   Mellody DrownLauren Parker, PA-C  dicyclomine (BENTYL) 20 MG tablet Take 1 tablet (20 mg total) by mouth 2 (two) times daily. 05/12/14   Berenize Gatlin A Forcucci, PA-C  ibuprofen (ADVIL,MOTRIN) 800 MG tablet Take 1 tablet (800 mg total) by mouth 3 (three) times daily. Patient not taking: Reported on 05/11/2014  12/21/13   Jillyn LedgerJessica K Palmer, PA-C  ibuprofen (ADVIL,MOTRIN) 800 MG tablet Take 1 tablet (800 mg total) by mouth 3 (three) times daily. 03/31/14   Mellody DrownLauren Parker, PA-C  pantoprazole (PROTONIX) 20 MG tablet Take 1 tablet (20 mg total) by mouth daily. 05/12/14   Daundre Biel A Forcucci, PA-C   BP 125/76 mmHg  Pulse 50  Temp(Src) 97.6 F (36.4 C) (Oral)  Resp 14  SpO2 100% Physical Exam  Constitutional: He is oriented to person, place, and time. He  appears well-developed and well-nourished. No distress.  HENT:  Head: Normocephalic.  Mouth/Throat: Oropharynx is clear and moist. No oropharyngeal exudate.  Eyes: Conjunctivae and EOM are normal. Pupils are equal, round, and reactive to light. No scleral icterus.  Neck: Normal range of motion. Neck supple. No JVD present. No thyromegaly present.  Cardiovascular: Normal rate, regular rhythm, normal heart sounds and intact distal pulses.  Exam reveals no gallop and no friction rub.   No murmur heard. Pulmonary/Chest: Effort normal and breath sounds normal. No respiratory distress. He has no wheezes. He has no rales. He exhibits no tenderness.  Abdominal: Soft. Normal appearance and bowel sounds are normal. He exhibits no distension and no mass. There is generalized tenderness. There is no rigidity, no rebound, no guarding, no CVA tenderness, no tenderness at McBurney's point and negative Murphy's sign.  Musculoskeletal: Normal range of motion.  Lymphadenopathy:    He has no cervical adenopathy.  Neurological: He is alert and oriented to person, place, and time. He has normal strength. No cranial nerve deficit or sensory deficit. Coordination normal.  Skin: Skin is warm and dry.  Psychiatric: He has a normal mood and affect. His behavior is normal. Judgment and thought content normal.  Nursing note and vitals reviewed.   ED Course  Procedures (including critical care time) Labs Review Labs Reviewed  CBC WITH DIFFERENTIAL - Abnormal; Notable for the following:    Neutrophils Relative % 39 (*)    Lymphocytes Relative 56 (*)    All other components within normal limits  COMPREHENSIVE METABOLIC PANEL - Abnormal; Notable for the following:    Glucose, Bld 69 (*)    All other components within normal limits  LIPASE, BLOOD - Abnormal; Notable for the following:    Lipase 67 (*)    All other components within normal limits  URINALYSIS, ROUTINE W REFLEX MICROSCOPIC - Abnormal; Notable for the  following:    Leukocytes, UA TRACE (*)    All other components within normal limits  GC/CHLAMYDIA PROBE AMP  URINE MICROSCOPIC-ADD ON  HIV ANTIBODY (ROUTINE TESTING)    Imaging Review No results found.   EKG Interpretation None      MDM   Final diagnoses:  Dysuria  Left upper quadrant pain  Diarrhea    Patient is a 53 year old male who presents to the emergency room for generalized abdominal pain. Physical exam reveals an alert, nontoxic-appearing male with generalized tenderness to the abdomen with no evidence of guarding, rigidity, or peritoneal signs. CBC unremarkable. CMP unremarkable except mild hypoglycemia. Lipase negative. UA shows trace leukocytes. Patient is complaining of dysuria at this time. Have performed GC chlamydia probe which is pending. HIV is also pending. We'll treat presumptively for STI with ceftriaxone and azithromycin. Patient treated here with normal saline bolus, Toradol. Patient is able to tolerate by mouth at this time. We will discharge the patient home with Protonix and Bentyl for abdominal cramping. Patient to return for intractable nausea and vomiting, worsening abdominal pain in  the right lower quadrant. Patient states understanding and agreement at this time. I have discussed this patient with Dr. Marion Downer who agrees with the above workup and plan.   Eben Burow, PA-C 05/12/14 0041  Olivia Mackie, MD 05/12/14 [phone]

## 2014-05-11 NOTE — ED Notes (Signed)
Pt. reports intermittent mid / left lateral abdominal pain with emesis and diarrhea onset 2 days ago , denies fever or chills.

## 2014-05-12 LAB — HIV ANTIBODY (ROUTINE TESTING W REFLEX): HIV 1&2 Ab, 4th Generation: NONREACTIVE

## 2014-05-12 MED ORDER — AZITHROMYCIN 250 MG PO TABS
1000.0000 mg | ORAL_TABLET | Freq: Once | ORAL | Status: AC
  Administered 2014-05-12: 1000 mg via ORAL
  Filled 2014-05-12: qty 4

## 2014-05-12 MED ORDER — DICYCLOMINE HCL 20 MG PO TABS: 20.0000 mg | ORAL_TABLET | Freq: Two times a day (BID) | ORAL | Status: DC

## 2014-05-12 MED ORDER — CEFTRIAXONE SODIUM 250 MG IJ SOLR
250.0000 mg | Freq: Once | INTRAMUSCULAR | Status: AC
  Administered 2014-05-12: 250 mg via INTRAMUSCULAR
  Filled 2014-05-12: qty 250

## 2014-05-12 MED ORDER — LIDOCAINE HCL (PF) 1 % IJ SOLN
5.0000 mL | Freq: Once | INTRAMUSCULAR | Status: AC
  Administered 2014-05-12: 5 mL
  Filled 2014-05-12: qty 5

## 2014-05-12 MED ORDER — PANTOPRAZOLE SODIUM 20 MG PO TBEC: 20.0000 mg | DELAYED_RELEASE_TABLET | Freq: Every day | ORAL | Status: DC

## 2014-05-12 NOTE — ED Notes (Signed)
Observed GC/chlamydia probe collect performed by CForcucci  PA-C

## 2014-05-12 NOTE — Discharge Instructions (Signed)
Abdominal Pain °Many things can cause abdominal pain. Usually, abdominal pain is not caused by a disease and will improve without treatment. It can often be observed and treated at home. Your health care provider will do a physical exam and possibly order blood tests and X-rays to help determine the seriousness of your pain. However, in many cases, more time must pass before a clear cause of the pain can be found. Before that point, your health care provider may not know if you need more testing or further treatment. °HOME CARE INSTRUCTIONS  °Monitor your abdominal pain for any changes. The following actions may help to alleviate any discomfort you are experiencing: °· Only take over-the-counter or prescription medicines as directed by your health care provider. °· Do not take laxatives unless directed to do so by your health care provider. °· Try a clear liquid diet (broth, tea, or water) as directed by your health care provider. Slowly move to a bland diet as tolerated. °SEEK MEDICAL CARE IF: °· You have unexplained abdominal pain. °· You have abdominal pain associated with nausea or diarrhea. °· You have pain when you urinate or have a bowel movement. °· You experience abdominal pain that wakes you in the night. °· You have abdominal pain that is worsened or improved by eating food. °· You have abdominal pain that is worsened with eating fatty foods. °· You have a fever. °SEEK IMMEDIATE MEDICAL CARE IF:  °· Your pain does not go away within 2 hours. °· You keep throwing up (vomiting). °· Your pain is felt only in portions of the abdomen, such as the right side or the left lower portion of the abdomen. °· You pass bloody or black tarry stools. °MAKE SURE YOU: °· Understand these instructions.   °· Will watch your condition.   °· Will get help right away if you are not doing well or get worse.   °Document Released: 03/15/2005 Document Revised: 06/10/2013 Document Reviewed: 02/12/2013 °ExitCare® Patient Information  ©2015 [employer]. This information is not intended to replace advice given to you by your health care provider. Make sure you discuss any questions you have with your health care provider. ° ° °Emergency Department Resource Guide °1) Find a Doctor and Pay Out of Pocket °Although you won't have to find out who is covered by your insurance plan, it is a good idea to ask around and get recommendations. You will then need to call the office and see if the doctor you have chosen will accept you as a new patient and what types of options they offer for patients who are self-pay. Some doctors offer discounts or will set up payment plans for their patients who do not have insurance, but you will need to ask so you aren't surprised when you get to your appointment. ° °2) Contact Your Local Health Department °Not all health departments have doctors that can see patients for sick visits, but many do, so it is worth a call to see if yours does. If you don't know where your local health department is, you can check in your phone book. The CDC also has a tool to help you locate your state's health department, and many state websites also have listings of all of their local health departments. ° °3) Find a Walk-in Clinic °If your illness is not likely to be very severe or complicated, you may want to try a walk in clinic. These are popping up all over the country in pharmacies, drugstores, and shopping centers. They're   usually staffed by nurse practitioners or physician assistants that have been trained to treat common illnesses and complaints. They're usually fairly quick and inexpensive. However, if you have serious medical issues or chronic medical problems, these are probably not your best option. ° °No Primary Care Doctor: °- Call [facility] at  832-8000 - they can help you locate a primary care doctor that  accepts your insurance, provides certain services, etc. °- Physician Referral Service- 1-800-533-3463 ° °Chronic  Pain Problems: °Organization         Address  Phone   Notes  °[facility]  (336) 297-2271 Patients need to be referred by their primary care doctor.  ° °Medication Assistance: °Organization         Address  Phone   Notes  °[place] [facility] [address] °[place], [place] [address] (336) 641-8030 --Must be a resident of [place] °-- Must have NO insurance coverage whatsoever (no Medicaid/ Medicare, etc.) °-- The pt. MUST have a primary care doctor that directs their care regularly and follows them in the community °  °[facility]  (866) 331-1348   °[employer]  (888) 892-1162   ° °Agencies that provide inexpensive medical care: °Organization         Address  Phone   Notes  °[facility]  (336) 832-8035   °[facility]    (336) 832-7272   °[facility] [address] °[place], [place] [address] (336) 832-4777   °[facility] [address], °[place] (336) 271-4999   °[facility]    (336) 373-0678   °[facility]    (336) 272-1050   °[facility] ° [address], [place] Phone:  (336) 832-4444, Fax:  (336) 832-4440 Hours of Operation:  9 am - 6 pm, M-F.  Also accepts Medicaid/Medicare and self-pay.  °[facility] ° [address], [place] Phone: (336) 832-3150, Fax: (336) 832-3151. Hours of Operation:  8:30 am - 5:30 pm, M-F.  Also accepts Medicaid and self-pay.  °[facility] [place] [address], [place] Phone: (336) 878-6027   °[facility] [address], [place], [place] (336)723-1848, Ext. 123 Mondays & Thursdays: 7-9 AM.  First 15 patients are seen on a first come, first serve basis. °  ° °Medicaid-accepting [place] Providers: ° °Organization         Address  Phone   Notes  °[facility] [address], [place] (336) 641-2100 Also  accepts self-pay patients.  °[facility] [address], [place] ° (336) 856-9996   °[facility] [address], [place] (336) 288-8857   °[facility] [address] [place] [address], [place] (336) 299-7000   °Veita Bland [address], [place]  ° (336) 373-1557 Only accepts [place] Access Medicaid patients after they have their name applied to their card.  ° °Self-Pay (no insurance) in [place]: ° °Organization         Address  Phone   Notes  °Sickle Cell Patients, [facility] [address], [place] (336) 832-1970   °[facility] [address], [place] (336) 832-4400   °[facility] ° [address], [place] (336) 992-4800   °[facility]/Dr. Osei-Bonsu ° [address] [place] [address], [place] or [address], [place] (336) 841-8500 Phone number for both [place] and [place] locations is the same.  °[facility] [address], [place] (336) 299-0000   °[facility] [address] [place] [address], [place] or [address] (336) 852-7530 °(336) 878-2260   °[facility] [address], [place] (336) 350-1642, phone; (336) 294-5005, fax Sees patients 1st and 3rd Saturday of every month.  Must not qualify for public or private insurance (i.e. Medicaid, Medicare, [place] Health Choice, Veterans' Benefits) • Household income should be no more than 200% of the poverty level •The clinic cannot treat you if you are pregnant or think you are pregnant • Sexually transmitted diseases are not treated at the clinic.  ° ° °Dental Care: °Organization         Address  Phone  Notes  °[facility] [address], [place] (336) 641-6152 Accepts children up to age 21 who are enrolled in Medicaid or [place] Health Choice; pregnant  women with a Medicaid card; and children who have applied for Medicaid or [place] Health Choice, but were declined, whose parents can pay a reduced fee at time of service.  °[facility] [place]  [address], [place] (336) 641-7733 Accepts children up to age 21 who are enrolled in Medicaid or [place] Health Choice; pregnant women with a Medicaid card; and children who have applied for Medicaid or [place] Health Choice, but were declined, whose parents can pay a reduced fee at time of service.  °[facility] ° [address], [place] (336) 641-4533 Patients are seen by appointment only. Walk-ins are not accepted. [facility] will see patients 18 years of age and older. °Monday - Tuesday (8am-5pm) °Most Wednesdays (8:30-5pm) °$30 per visit, cash only  °[facility] ° [address], [place] (336) 641-4533 Patients are seen by appointment only. Walk-ins are not accepted. [facility] will see patients 18 years of age and older. °One Wednesday Evening (Monthly: Volunteer Based).  $30 per visit, cash only  °[facility]  (919) 537-3737 for adults; Children under age 4, call Graduate Pediatric Dentistry at ([phone]) [phone]. Children aged 4-14, please call (919) [phone] to request a pediatric application. ° Dental services are provided in all areas of dental care including fillings, crowns and bridges, complete and partial dentures, implants, gum treatment, root canals, and extractions. Preventive care is also provided. Treatment is provided to both adults and children. °Patients are selected via a lottery and there is often a waiting list. °  °[facility] [address], ° ° (336) 763-8833 [email or web address] °  °[facility] [address], [place], [place] (336)723-1848, Ext. 123 Second and Fourth Thursday of each month, opens at 6:30 AM; Clinic ends at 9 AM.  Patients are  seen on a first-come first-served basis, and a limited number are seen during each clinic.  ° °[facility] ° [address], [place], [place] (336) 723-7904   Eligibility Requirements °You must have lived in [place], or [place] counties for at least the last three months. °  You cannot be eligible for state or federal sponsored healthcare insurance, including Veterans Administration, Medicaid, or Medicare. °  You generally cannot be eligible for healthcare insurance through   your employer.  °  How to apply: °Eligibility screenings are held every Tuesday and Wednesday afternoon from 1:00 pm until 4:00 pm. You do not need an appointment for the interview!  °[facility] [address], [place], [place] 336-631-2330   °[facility]  336-342-8273   °[facility]  336-703-3100   °[facility]  336-570-6415   ° °Behavioral Health Resources in the Community: °Intensive Outpatient Programs °Organization         Address  Phone  Notes  °[facility] [address], [place], [place] 336-878-6098   °[facility] [address], [place], [place] 336-832-9800   °[facility] [address], [place], [place] ° 336-882-2125   °[facility] [address],  °[place], [place] 1-800-853-5163 or 336-641-4981   °Substance Abuse Resources °Organization         Address  Phone  Notes  °[facility]  336-882-2125   °[facility]  336-784-9470   °[facility]  336-285-9073   °[facility]  336-845-3988   °Residential & Outpatient Substance Abuse Program  1-800-659-3381   °Psychological Services °Organization         Address  Phone  Notes  °[facility]  336- 832-9600   °[facility]  336- 378-7881   °[facility] [address], [place] 1-800-853-5163 or 336-641-4981   ° °Mobile Crisis  Teams °Organization         Address  Phone  Notes  °[facility], Mobile Crisis Care Unit  1-877-626-1772   °Assertive °[facility] ° [address] [place], [place] 336-834-9664   °Sharon DeEsch [address] °[place] 336-554-5454   ° °Self-Help/Support Groups °Organization         Address  Phone             Notes  °[facility] - variety of support groups  336- 373-1402 Call for more information  °Narcotics Anonymous (NA), [facility] [address], °[place]  2 meetings at this location  ° °Residential Treatment Programs °Organization         Address  Phone  Notes  °[facility] [address],    °[place]  1-866-801-8205   °[facility] ° [address], [place], [place] 704-293-8524   °[facility] [address], [place] 336-845-3988 Admissions: 8am-3pm M-F  °[facility] [address]    °[place], [place] 336-841-1104   °[facility] [address], [place], [place] 336-379-7146   °[facility] [address]  °[place], [place] 336-285-9073   °[facility] - Intensive Outpatient [address], [place], [place] 336-852-3033   °[facility] [address]  °[place], [place] 1-877-615-2722 or 336-784-9470   °[facility] [address] [place], [place] 336-227-7417 Accepts Medicaid  °[facility] [address]  °[place] 1-800-659-3381 Substance Abuse/Addiction Treatment  ° °[place] Behavioral Health Resources °Organization         Address  Phone  Notes  °[facility]  (888) 581-9988   °Julie Brannon, PhD [address] [place], [place]   (336) 349-5553 or (336) 951-0000   ° Behavioral   [address] °[place], [place] (336) 349-4454   °  [facility] [address], [place], [place] (336) 342-8316  Insurance/Medicaid/sponsorship through [facility]  °[facility] [address]                                    [place], [place] (336) 342-8316 Therapy/tele-psych/case  °[facility] [address]  ° [place], [place] (336) 349-2233    °Dr. Arfeen  (336) 349-4544   °[facility] [place]  [employer] [place] [facility] 1) [address], [place] °2) [address], [place] °3)  [address], [place] (336) 349-3220 °(336) 342-7768 ° °(336) 342-8140   °[place] Child Abuse [facility] (336) 342-1394 or (336) 342-3537 (After Hours)    ° ° ° ° °

## 2014-05-13 LAB — GC/CHLAMYDIA PROBE AMP
CT Probe RNA: NEGATIVE
GC Probe RNA: NEGATIVE

## 2014-08-16 ENCOUNTER — Encounter (HOSPITAL_COMMUNITY): Payer: Self-pay

## 2014-08-16 ENCOUNTER — Emergency Department (HOSPITAL_COMMUNITY)
Admission: EM | Admit: 2014-08-16 | Discharge: 2014-08-16 | Disposition: A | Discharge status: Home / Self Care | Payer: Self-pay | Attending: Emergency Medicine | Admitting: Emergency Medicine

## 2014-08-16 DIAGNOSIS — G8929 Other chronic pain: Secondary | ICD-10-CM | POA: Insufficient documentation

## 2014-08-16 DIAGNOSIS — K589 Irritable bowel syndrome without diarrhea: Secondary | ICD-10-CM | POA: Insufficient documentation

## 2014-08-16 DIAGNOSIS — K5900 Constipation, unspecified: Secondary | ICD-10-CM | POA: Insufficient documentation

## 2014-08-16 DIAGNOSIS — Z720 Tobacco use: Secondary | ICD-10-CM | POA: Insufficient documentation

## 2014-08-16 DIAGNOSIS — R197 Diarrhea, unspecified: Secondary | ICD-10-CM | POA: Insufficient documentation

## 2014-08-16 DIAGNOSIS — Z79899 Other long term (current) drug therapy: Secondary | ICD-10-CM | POA: Insufficient documentation

## 2014-08-16 LAB — URINALYSIS, ROUTINE W REFLEX MICROSCOPIC
Bilirubin Urine: NEGATIVE
Glucose, UA: NEGATIVE mg/dL
Hgb urine dipstick: NEGATIVE
Ketones, ur: NEGATIVE mg/dL
Leukocytes, UA: NEGATIVE
Nitrite: NEGATIVE
Protein, ur: NEGATIVE mg/dL
Specific Gravity, Urine: 1.017 (ref 1.005–1.030)
Urobilinogen, UA: 0.2 mg/dL (ref 0.0–1.0)
pH: 5.5 (ref 5.0–8.0)

## 2014-08-16 LAB — COMPREHENSIVE METABOLIC PANEL
ALT: 11 U/L (ref 0–53)
AST: 14 U/L (ref 0–37)
Albumin: 3.5 g/dL (ref 3.5–5.2)
Alkaline Phosphatase: 57 U/L (ref 39–117)
Anion gap: 7 (ref 5–15)
BUN: 9 mg/dL (ref 6–23)
CO2: 27 mmol/L (ref 19–32)
Calcium: 9 mg/dL (ref 8.4–10.5)
Chloride: 104 mmol/L (ref 96–112)
Creatinine, Ser: 0.93 mg/dL (ref 0.50–1.35)
GFR calc Af Amer: >90 mL/min (ref >90)
GFR calc non Af Amer: >90 mL/min (ref >90)
Glucose, Bld: 103 mg/dL — ABNORMAL HIGH (ref 70–99)
Potassium: 4 mmol/L (ref 3.5–5.1)
Sodium: 138 mmol/L (ref 135–145)
Total Bilirubin: 0.4 mg/dL (ref 0.3–1.2)
Total Protein: 6.6 g/dL (ref 6.0–8.3)

## 2014-08-16 LAB — CBC WITH DIFFERENTIAL/PLATELET
Basophils Absolute: 0 10*3/uL (ref 0.0–0.1)
Basophils Relative: 1 % (ref 0–1)
Eosinophils Absolute: 0.1 10*3/uL (ref 0.0–0.7)
Eosinophils Relative: 2 % (ref 0–5)
HCT: 42.6 % (ref 39.0–52.0)
Hemoglobin: 14.9 g/dL (ref 13.0–17.0)
Lymphocytes Relative: 41 % (ref 12–46)
Lymphs Abs: 1.7 10*3/uL (ref 0.7–4.0)
MCH: 30.2 pg (ref 26.0–34.0)
MCHC: 35 g/dL (ref 30.0–36.0)
MCV: 86.4 fL (ref 78.0–100.0)
Monocytes Absolute: 0.4 10*3/uL (ref 0.1–1.0)
Monocytes Relative: 10 % (ref 3–12)
Neutro Abs: 2 10*3/uL (ref 1.7–7.7)
Neutrophils Relative %: 47 % (ref 43–77)
Platelets: 153 10*3/uL (ref 150–400)
RBC: 4.93 MIL/uL (ref 4.22–5.81)
RDW: 13.4 % (ref 11.5–15.5)
WBC: 4.2 10*3/uL (ref 4.0–10.5)

## 2014-08-16 LAB — I-STAT CG4 LACTIC ACID, ED: Lactic Acid, Venous: 1 mmol/L (ref 0.5–2.0)

## 2014-08-16 LAB — LIPASE, BLOOD: Lipase: 31 U/L (ref 11–59)

## 2014-08-16 MED ORDER — MORPHINE SULFATE 4 MG/ML IJ SOLN
4.0000 mg | Freq: Once | INTRAMUSCULAR | Status: AC
  Administered 2014-08-16: 4 mg via INTRAVENOUS
  Filled 2014-08-16: qty 1

## 2014-08-16 MED ORDER — SODIUM CHLORIDE 0.9 % IV BOLUS (SEPSIS)
1000.0000 mL | Freq: Once | INTRAVENOUS | Status: AC
  Administered 2014-08-16: 1000 mL via INTRAVENOUS

## 2014-08-16 MED ORDER — HYDROCODONE-ACETAMINOPHEN 5-325 MG PO TABS: 1.0000 | ORAL_TABLET | Freq: Four times a day (QID) | ORAL | Status: DC | PRN

## 2014-08-16 MED ORDER — ONDANSETRON HCL 4 MG/2ML IJ SOLN
4.0000 mg | Freq: Once | INTRAMUSCULAR | Status: AC
  Administered 2014-08-16: 4 mg via INTRAVENOUS
  Filled 2014-08-16: qty 2

## 2014-08-16 MED ORDER — DICYCLOMINE HCL 20 MG PO TABS: 20.0000 mg | ORAL_TABLET | Freq: Two times a day (BID) | ORAL | Status: DC

## 2014-08-16 NOTE — ED Provider Notes (Addendum)
CSN: [id number]     Arrival date & time 08/16/14  0745 History   First MD Initiated Contact with Patient 08/16/14 0831     Chief Complaint  Patient presents with  . Abdominal Pain     (Consider location/radiation/quality/duration/timing/severity/associated sxs/prior Treatment) Patient is a 54 y.o. male presenting with abdominal pain. The history is provided by the patient.  Abdominal Pain Pain location:  Generalized (Worse on the right side) Pain quality: aching, cramping and gnawing   Pain radiates to:  Does not radiate Pain severity:  Moderate Onset quality:  Gradual Duration:  5 days Timing:  Constant Progression:  Worsening Chronicity:  Recurrent Context: alcohol use and eating   Context: not medication withdrawal, not previous surgeries, not sick contacts, not suspicious food intake and not trauma   Relieved by:  Nothing Worsened by:  Eating Ineffective treatments:  None tried Associated symptoms: constipation, diarrhea and nausea   Associated symptoms: no anorexia, no chest pain, no dysuria, no fever, no shortness of breath and no vomiting   Associated symptoms comment:  Goes between loose and hard stools Risk factors: has not had multiple surgeries, no NSAID use and no recent hospitalization   Risk factors comment:  Drinks 2-3 beers daily does not take any medication regularly   Past Medical History  Diagnosis Date  . Chronic pain    Past Surgical History  Procedure Laterality Date  . Orthopedic surgery     No family history on file. History  Substance Use Topics  . Smoking status: Current Every Day Smoker -- 0.50 packs/day    Types: Cigarettes  . Smokeless tobacco: Not on file  . Alcohol Use: Yes     Comment: daily    Review of Systems  Constitutional: Negative for fever.  Respiratory: Negative for shortness of breath.   Cardiovascular: Negative for chest pain.  Gastrointestinal: Positive for nausea, abdominal pain, diarrhea and constipation. Negative  for vomiting and anorexia.  Genitourinary: Negative for dysuria.  All other systems reviewed and are negative.     Allergies  Review of patient's allergies indicates no known allergies.  Home Medications   Prior to Admission medications   Medication Sig Start Date End Date Taking? Authorizing Provider  dextromethorphan-guaiFENesin (MUCINEX DM) 30-600 MG per 12 hr tablet Take 1 tablet by mouth 2 (two) times daily. Patient not taking: Reported on 05/11/2014 03/31/14   Mellody DrownLauren Parker, PA-C  dicyclomine (BENTYL) 20 MG tablet Take 1 tablet (20 mg total) by mouth 2 (two) times daily. 05/12/14   Courtney A Forcucci, PA-C  ibuprofen (ADVIL,MOTRIN) 800 MG tablet Take 1 tablet (800 mg total) by mouth 3 (three) times daily. Patient not taking: Reported on 05/11/2014 12/21/13   Jillyn LedgerJessica K Palmer, PA-C  ibuprofen (ADVIL,MOTRIN) 800 MG tablet Take 1 tablet (800 mg total) by mouth 3 (three) times daily. 03/31/14   Mellody DrownLauren Parker, PA-C  pantoprazole (PROTONIX) 20 MG tablet Take 1 tablet (20 mg total) by mouth daily. 05/04/14   Louann SjogrenVictoria L Creech, PA-C  pantoprazole (PROTONIX) 20 MG tablet Take 1 tablet (20 mg total) by mouth daily. 05/12/14   Courtney A Forcucci, PA-C   BP 116/78 mmHg  Pulse 66  Temp(Src) 98 F (36.7 C) (Oral)  Resp 14  Ht 5\' 9"  (1.753 m)  SpO2 97% Physical Exam  Constitutional: He is oriented to person, place, and time. He appears well-developed and well-nourished. No distress.  HENT:  Head: Normocephalic and atraumatic.  Mouth/Throat: Oropharynx is clear and moist.  Eyes: Conjunctivae and EOM  are normal. Pupils are equal, round, and reactive to light.  Neck: Normal range of motion. Neck supple.  Cardiovascular: Normal rate, regular rhythm and intact distal pulses.   No murmur heard. Pulmonary/Chest: Effort normal and breath sounds normal. No respiratory distress. He has no wheezes. He has no rales.  Abdominal: Soft. Normal appearance and bowel sounds are normal. He exhibits no  distension. There is no hepatosplenomegaly. There is tenderness. There is no rebound, no guarding, no CVA tenderness and negative Murphy's sign.  Diffuse tenderness but most pronounced in the right upper quadrant. No Murphy sign present. No palpable hepatomegaly. Abdomen is soft and normal in appearance  Musculoskeletal: Normal range of motion. He exhibits no edema or tenderness.  Neurological: He is alert and oriented to person, place, and time.  Skin: Skin is warm and dry. No rash noted. No erythema.  Psychiatric: He has a normal mood and affect. His behavior is normal.  Nursing note and vitals reviewed.   ED Course  Procedures (including critical care time) Labs Review Labs Reviewed  COMPREHENSIVE METABOLIC PANEL - Abnormal; Notable for the following:    Glucose, Bld 103 (*)    All other components within normal limits  URINALYSIS, ROUTINE W REFLEX MICROSCOPIC - Abnormal; Notable for the following:    APPearance CLOUDY (*)    All other components within normal limits  CBC WITH DIFFERENTIAL/PLATELET  LIPASE, BLOOD  I-STAT CG4 LACTIC ACID, ED    Imaging Review No results found.   EKG Interpretation None      MDM   Final diagnoses:  IBS (irritable bowel syndrome)    Patient here today with a complaint of abdominal pain for the last 4-5 days. Patient does not see a doctor regularly and takes no medication regularly. He does drink 2-3 beers daily and uses tobacco. Eating makes the pain worse and he has intermittent constipation and diarrhea. On exam he has diffuse abdominal pain but worse in the right side of his abdomen. He complains of nausea without vomiting. Patient states he's had pain like this before but it's been a while. Looking back through the patient's records in 2013 and 14 he had approximately 5 CT scans done of his abdomen without abnormality. Patient states his urine has looked dark but denies any dysuria or penile discharge.  Concern for liver as the cause for  his pain versus gallbladder versus pancreatitis. Lower suspicion for appendicitis at this time the possibility for diverticulitis versus IBD.  CBC, CMP, lipase, UA, lactate pending. Patient given IV fluids, pain and nausea medication. Currently hemodynamically stable.  10:49 AM Patient has completely normal labs and with multiple CT scans done in the past for similar symptoms do not feel patient needs a CT today.  10:58 AM After eating patient feels fine he denies having any abdominal pain at this time. Do not feel that he needs a CT scan. We'll discharge home with Bentyl and pain medication.  Gwyneth Sprout, MD 08/16/14 1058  Gwyneth Sprout, MD 08/16/14 1100

## 2014-08-16 NOTE — Discharge Instructions (Signed)
Irritable Bowel Syndrome Irritable bowel syndrome (IBS) is caused by a disturbance of normal bowel function and is a common digestive disorder. You may also hear this condition called spastic colon, mucous colitis, and irritable colon. There is no cure for IBS. However, symptoms often gradually improve or disappear with a good diet, stress management, and medicine. This condition usually appears in late adolescence or early adulthood. Women develop it twice as often as men. CAUSES  After food has been digested and absorbed in the small intestine, waste material is moved into the large intestine, or colon. In the colon, water and salts are absorbed from the undigested products coming from the small intestine. The remaining residue, or fecal material, is held for elimination. Under normal circumstances, gentle, rhythmic contractions of the bowel walls push the fecal material along the colon toward the rectum. In IBS, however, these contractions are irregular and poorly coordinated. The fecal material is either retained too long, resulting in constipation, or expelled too soon, producing diarrhea. SIGNS AND SYMPTOMS  The most common symptom of IBS is abdominal pain. It is often in the lower left side of the abdomen, but it may occur anywhere in the abdomen. The pain comes from spasms of the bowel muscles happening too much and from the buildup of gas and fecal material in the colon. This pain:  Can range from sharp abdominal cramps to a dull, continuous ache.  Often worsens soon after eating.  Is often relieved by having a bowel movement or passing gas. Abdominal pain is usually accompanied by constipation, but it may also produce diarrhea. The diarrhea often occurs right after a meal or upon waking up in the morning. The stools are often soft, watery, and flecked with mucus. Other symptoms of IBS include:  Bloating.  Loss of appetite.  Heartburn.  Backache.  Dull pain in the arms or  shoulders.  Nausea.  Burping.  Vomiting.  Gas. IBS may also cause symptoms that are unrelated to the digestive system, such as:  Fatigue.  Headaches.  Anxiety.  Shortness of breath.  Trouble concentrating.  Dizziness. These symptoms tend to come and go. DIAGNOSIS  The symptoms of IBS may seem like symptoms of other, more serious digestive disorders. Your health care provider may want to perform tests to exclude these disorders.  TREATMENT Many medicines are available to help correct bowel function or relieve bowel spasms and abdominal pain. Among the medicines available are:  Laxatives for severe constipation and to help restore normal bowel habits.  Specific antidiarrheal medicines to treat severe or lasting diarrhea.  Antispasmodic agents to relieve intestinal cramps. Your health care provider may also decide to treat you with a mild tranquilizer or sedative during unusually stressful periods in your life. Your health care provider may also prescribe antidepressant medicine. The use of this medicine has been shown to reduce pain and other symptoms of IBS. Remember that if any medicine is prescribed for you, you should take it exactly as directed. Make sure your health care provider knows how well it worked for you. HOME CARE INSTRUCTIONS   Take all medicines as directed by your health care provider.  Avoid foods that are high in fat or oils, such as heavy cream, butter, frankfurters, sausage, and other fatty meats.  Avoid foods that make you go to the bathroom, such as fruit, fruit juice, and dairy products.  Cut out carbonated drinks, chewing gum, and "gassy" foods such as beans and cabbage. This may help relieve bloating and burping.    Eat foods with bran, and drink plenty of liquids with the bran foods. This helps relieve constipation.  Keep track of what foods seem to bring on your symptoms.  Avoid emotionally charged situations or circumstances that produce  anxiety.  Start or continue exercising.  Get plenty of rest and sleep. Document Released: 06/05/2005 Document Revised: 06/10/2013 Document Reviewed: 01/24/2008 [employer] Patient Information 2015 [employer]. This information is not intended to replace advice given to you by your health care provider. Make sure you discuss any questions you have with your health care provider.  

## 2014-08-16 NOTE — ED Notes (Signed)
Pt reports abdominal pain x 4 - 5 days.  Pt alert and oriented and in NAD.

## 2014-09-06 ENCOUNTER — Emergency Department (HOSPITAL_COMMUNITY)
Admission: EM | Admit: 2014-09-06 | Discharge: 2014-09-06 | Disposition: A | Discharge status: Home / Self Care | Payer: Self-pay | Attending: Emergency Medicine | Admitting: Emergency Medicine

## 2014-09-06 ENCOUNTER — Encounter (HOSPITAL_COMMUNITY): Payer: Self-pay | Admitting: *Deleted

## 2014-09-06 DIAGNOSIS — Z720 Tobacco use: Secondary | ICD-10-CM | POA: Insufficient documentation

## 2014-09-06 DIAGNOSIS — G8929 Other chronic pain: Secondary | ICD-10-CM | POA: Insufficient documentation

## 2014-09-06 DIAGNOSIS — R1084 Generalized abdominal pain: Secondary | ICD-10-CM | POA: Insufficient documentation

## 2014-09-06 DIAGNOSIS — M545 Low back pain, unspecified: Secondary | ICD-10-CM

## 2014-09-06 DIAGNOSIS — Z79899 Other long term (current) drug therapy: Secondary | ICD-10-CM | POA: Insufficient documentation

## 2014-09-06 LAB — URINALYSIS, ROUTINE W REFLEX MICROSCOPIC
Bilirubin Urine: NEGATIVE
Glucose, UA: NEGATIVE mg/dL
Hgb urine dipstick: NEGATIVE
Ketones, ur: NEGATIVE mg/dL
Leukocytes, UA: NEGATIVE
Nitrite: NEGATIVE
Protein, ur: NEGATIVE mg/dL
Specific Gravity, Urine: 1.017 (ref 1.005–1.030)
Urobilinogen, UA: 1 mg/dL (ref 0.0–1.0)
pH: 5.5 (ref 5.0–8.0)

## 2014-09-06 LAB — COMPREHENSIVE METABOLIC PANEL
ALT: 11 U/L (ref 0–53)
AST: 19 U/L (ref 0–37)
Albumin: 3.6 g/dL (ref 3.5–5.2)
Alkaline Phosphatase: 62 U/L (ref 39–117)
Anion gap: 3 — ABNORMAL LOW (ref 5–15)
BUN: 9 mg/dL (ref 6–23)
CO2: 30 mmol/L (ref 19–32)
Calcium: 9 mg/dL (ref 8.4–10.5)
Chloride: 106 mmol/L (ref 96–112)
Creatinine, Ser: 1.01 mg/dL (ref 0.50–1.35)
GFR calc Af Amer: >90 mL/min (ref >90)
GFR calc non Af Amer: 83 mL/min — ABNORMAL LOW (ref >90)
Glucose, Bld: 130 mg/dL — ABNORMAL HIGH (ref 70–99)
Potassium: 3.8 mmol/L (ref 3.5–5.1)
Sodium: 139 mmol/L (ref 135–145)
Total Bilirubin: 0.7 mg/dL (ref 0.3–1.2)
Total Protein: 6.6 g/dL (ref 6.0–8.3)

## 2014-09-06 LAB — CBC WITH DIFFERENTIAL/PLATELET
Basophils Absolute: 0 10*3/uL (ref 0.0–0.1)
Basophils Relative: 1 % (ref 0–1)
Eosinophils Absolute: 0.1 10*3/uL (ref 0.0–0.7)
Eosinophils Relative: 2 % (ref 0–5)
HCT: 44.2 % (ref 39.0–52.0)
Hemoglobin: 15.2 g/dL (ref 13.0–17.0)
Lymphocytes Relative: 37 % (ref 12–46)
Lymphs Abs: 1.6 10*3/uL (ref 0.7–4.0)
MCH: 30.4 pg (ref 26.0–34.0)
MCHC: 34.4 g/dL (ref 30.0–36.0)
MCV: 88.4 fL (ref 78.0–100.0)
Monocytes Absolute: 0.4 10*3/uL (ref 0.1–1.0)
Monocytes Relative: 8 % (ref 3–12)
Neutro Abs: 2.3 10*3/uL (ref 1.7–7.7)
Neutrophils Relative %: 52 % (ref 43–77)
Platelets: 147 10*3/uL — ABNORMAL LOW (ref 150–400)
RBC: 5 MIL/uL (ref 4.22–5.81)
RDW: 13.7 % (ref 11.5–15.5)
WBC: 4.4 10*3/uL (ref 4.0–10.5)

## 2014-09-06 LAB — LIPASE, BLOOD: Lipase: 47 U/L (ref 11–59)

## 2014-09-06 MED ORDER — ACETAMINOPHEN 500 MG PO TABS
1000.0000 mg | ORAL_TABLET | Freq: Once | ORAL | Status: AC
  Administered 2014-09-06: 1000 mg via ORAL
  Filled 2014-09-06: qty 2

## 2014-09-06 MED ORDER — NAPROXEN 500 MG PO TABS: 500.0000 mg | ORAL_TABLET | Freq: Two times a day (BID) | ORAL | Status: DC

## 2014-09-06 MED ORDER — SODIUM CHLORIDE 0.9 % IV BOLUS (SEPSIS)
1000.0000 mL | Freq: Once | INTRAVENOUS | Status: AC
  Administered 2014-09-06: 1000 mL via INTRAVENOUS

## 2014-09-06 MED ORDER — OMEPRAZOLE 20 MG PO CPDR
20.0000 mg | DELAYED_RELEASE_CAPSULE | Freq: Every day | ORAL | Status: DC
Start: 2014-09-06 — End: 2014-10-19

## 2014-09-06 NOTE — ED Notes (Signed)
Pt has had diarrhea and back pain for five days. Came to the ER for relief.

## 2014-09-06 NOTE — ED Notes (Signed)
NAD at this time. Pt is stable and going home.  

## 2014-09-06 NOTE — Discharge Instructions (Signed)
Abdominal Pain Many things can cause abdominal pain. Usually, abdominal pain is not caused by a disease and will improve without treatment. It can often be observed and treated at home. Your health care provider will do a physical exam and possibly order blood tests and X-rays to help determine the seriousness of your pain. However, in many cases, more time must pass before a clear cause of the pain can be found. Before that point, your health care provider may not know if you need more testing or further treatment. HOME CARE INSTRUCTIONS  Monitor your abdominal pain for any changes. The following actions may help to alleviate any discomfort you are experiencing:  Only take over-the-counter or prescription medicines as directed by your health care provider.  Do not take laxatives unless directed to do so by your health care provider.  Try a clear liquid diet (broth, tea, or water) as directed by your health care provider. Slowly move to a bland diet as tolerated. SEEK MEDICAL CARE IF:  You have unexplained abdominal pain.  You have abdominal pain associated with nausea or diarrhea.  You have pain when you urinate or have a bowel movement.  You experience abdominal pain that wakes you in the night.  You have abdominal pain that is worsened or improved by eating food.  You have abdominal pain that is worsened with eating fatty foods.  You have a fever. SEEK IMMEDIATE MEDICAL CARE IF:   Your pain does not go away within 2 hours.  You keep throwing up (vomiting).  Your pain is felt only in portions of the abdomen, such as the right side or the left lower portion of the abdomen.  You pass bloody or black tarry stools. MAKE SURE YOU:  Understand these instructions.   Will watch your condition.   Will get help right away if you are not doing well or get worse.  Document Released: 03/15/2005 Document Revised: 06/10/2013 Document Reviewed: 02/12/2013 [facility] Patient Information  2015 [place], [place]. This information is not intended to replace advice given to you by your health care provider. Make sure you discuss any questions you have with your health care provider.  Back Injury Prevention Back injuries can be extremely painful and difficult to heal. After having one back injury, you are much more likely to experience another later on. It is important to learn how to avoid injuring or re-injuring your back. The following tips can help you to prevent a back injury. PHYSICAL FITNESS  Exercise regularly and try to develop good tone in your abdominal muscles. Your abdominal muscles provide a lot of the support needed by your back.  Do aerobic exercises (walking, jogging, biking, swimming) regularly.  Do exercises that increase balance and strength (tai chi, yoga) regularly. This can decrease your risk of falling and injuring your back.  Stretch before and after exercising.  Maintain a healthy weight. The more you weigh, the more stress is placed on your back. For every pound of weight, 10 times that amount of pressure is placed on the back. DIET  Talk to your caregiver about how much calcium and vitamin D you need per day. These nutrients help to prevent weakening of the bones (osteoporosis). Osteoporosis can cause broken (fractured) bones that lead to back pain.  Include good sources of calcium in your diet, such as dairy products, green, leafy vegetables, and products with calcium added (fortified).  Include good sources of vitamin D in your diet, such as milk and foods that are fortified  with vitamin D.  Consider taking a nutritional supplement or a multivitamin if needed.  Stop smoking if you smoke. POSTURE  Sit and stand up straight. Avoid leaning forward when you sit or hunching over when you stand.  Choose chairs with good low back (lumbar) support.  If you work at a desk, sit close to your work so you do not need to lean over. Keep your chin tucked in.  Keep your neck drawn back and elbows bent at a right angle. Your arms should look like the letter "L."  Sit high and close to the steering wheel when you drive. Add a lumbar support to your car seat if needed.  Avoid sitting or standing in one position for too long. Take breaks to get up, stretch, and walk around at least once every hour. Take breaks if you are driving for long periods of time.  Sleep on your side with your knees slightly bent, or sleep on your back with a pillow under your knees. Do not sleep on your stomach. LIFTING, TWISTING, AND REACHING  Avoid heavy lifting, especially repetitive lifting. If you must do heavy lifting:  Stretch before lifting.  Work slowly.  Rest between lifts.  Use carts and dollies to move objects when possible.  Make several small trips instead of carrying 1 heavy load.  Ask for help when you need it.  Ask for help when moving big, awkward objects.  Follow these steps when lifting:  Stand with your feet shoulder-width apart.  Get as close to the object as you can. Do not try to pick up heavy objects that are far from your body.  Use handles or lifting straps if they are available.  Bend at your knees. Squat down, but keep your heels off the floor.  Keep your shoulders pulled back, your chin tucked in, and your back straight.  Lift the object slowly, tightening the muscles in your legs, abdomen, and buttocks. Keep the object as close to the center of your body as possible.  When you put a load down, use these same guidelines in reverse.  Do not:  Lift the object above your waist.  Twist at the waist while lifting or carrying a load. Move your feet if you need to turn, not your waist.  Bend over without bending at your knees.  Avoid reaching over your head, across a table, or for an object on a high surface. OTHER TIPS  Avoid wet floors and keep sidewalks clear of ice to prevent falls.  Do not sleep on a mattress that is too  soft or too hard.  Keep items that are used frequently within easy reach.  Put heavier objects on shelves at waist level and lighter objects on lower or higher shelves.  Find ways to decrease your stress, such as exercise, massage, or relaxation techniques. Stress can build up in your muscles. Tense muscles are more vulnerable to injury.  Seek treatment for depression or anxiety if needed. These conditions can increase your risk of developing back pain. SEEK MEDICAL CARE IF:  You injure your back.  You have questions about diet, exercise, or other ways to prevent back injuries. MAKE SURE YOU:  Understand these instructions.  Will watch your condition.  Will get help right away if you are not doing well or get worse. Document Released: 07/13/2004 Document Revised: 08/28/2011 Document Reviewed: 07/17/2011 [facility] Patient Information 2015 [place], [place]. This information is not intended to replace advice given to you by your health  care provider. Make sure you discuss any questions you have with your health care provider.   Emergency Department Resource Guide 1) Find a Doctor and Pay Out of Pocket Although you won't have to find out who is covered by your insurance plan, it is a good idea to ask around and get recommendations. You will then need to call the office and see if the doctor you have chosen will accept you as a new patient and what types of options they offer for patients who are self-pay. Some doctors offer discounts or will set up payment plans for their patients who do not have insurance, but you will need to ask so you aren't surprised when you get to your appointment.  2) Contact Your Local Health Department Not all health departments have doctors that can see patients for sick visits, but many do, so it is worth a call to see if yours does. If you don't know where your local health department is, you can check in your phone book. The CDC also has a tool to help you  locate your state's health department, and many state websites also have listings of all of their local health departments.  3) Find a [facility] If your illness is not likely to be very severe or complicated, you may want to try a walk in clinic. These are popping up all over the country in pharmacies, drugstores, and shopping centers. They're usually staffed by nurse practitioners or physician assistants that have been trained to treat common illnesses and complaints. They're usually fairly quick and inexpensive. However, if you have serious medical issues or chronic medical problems, these are probably not your best option.  No Primary Care Doctor: - Call Health Connect at  [phone] - they can help you locate a primary care doctor that  accepts your insurance, provides certain services, etc. - Physician Referral Service- [phone]  Chronic Pain Problems: Organization         Address  Phone   Notes  [facility]  [phone] Patients need to be referred by their primary care doctor.   Medication Assistance: Organization         Address  Phone   Notes  [facility] Medication [facility] [address] [address] [place], [place] [address] [phone] --Must be a resident of [facility] -- Must have NO insurance coverage whatsoever (no Medicaid/ Medicare, etc.) -- The pt. MUST have a primary care doctor that directs their care regularly and follows them in the community   [facility]ist  [phone]   [employer]  [phone]    Agencies that provide inexpensive medical care: Organization         Address  Phone   Notes  [place]  [phone]   [facility]    [phone]   [facility] [place], [place] [address] [phone]   [place] [address] [place] [phone]   Planned Parenthood    [phone]   [facility]    [phone]   [place] and [place] [address], [place] Phone:  [phone], Fax:  [phone] Hours of Operation:  9 am - 6 pm, M-F.  Also accepts Medicaid/Medicare and self-pay.  [facility] for [place], [address], [place] Phone: [phone], Fax: [phone]. Hours of Operation:  8:30 am - 5:30 pm, M-F.  Also accepts Medicaid and self-pay.  [facility] [place] [address] [place] Phone: [phone]   [place], [place], [place] [phone], Ext. [phone] Mondays & Thursdays: 7-9 AM.  First 15 patients are seen on a first come, first serve basis.    [place] Providers:  Organization         Address  Phone   Notes  [facility] [address], [place] [phone] Also accepts self-pay patients.  [facility] [address], [place]  [phone]   [place], [address], [place] [phone]   [facility] [address], [place] [phone]   Lucianne Lei [address], [place]   [phone] Only accepts [place] Access [place] patients after they have their name applied to their card.   Self-Pay (no insurance) in [facility]:  Organization         Address  Phone   Notes  Sickle Cell Patients, [facility] [place] [phone]   [facility] [place] [phone]   [facility] [place]  [place], [place], [place] [phone]   [facility]/Dr. Osei-Bonsu  [address] [place] or [address], [place] [phone] Phone number for both [place] and [place] locations is the same.  Urgent Medical and [facility] [address]  [place] [phone]   [facility] [address] [place] or [address] [phone]   [facility] [address], [place] [phone], phone; [phone], fax Sees patients 1st and 3rd Saturday of every month.  Must not qualify for public or private insurance (i.e. Medicaid, Medicare, [place] Health Choice, Veterans' Benefits)  Household income should be no more than 200% of the poverty level The clinic cannot treat you if you are pregnant or think you are pregnant  Sexually transmitted diseases are not treated at the clinic.    Dental Care: Organization         Address  Phone  Notes  [facility] Department of [facility] [place] [phone] Accepts children up to age 2 who are enrolled in [place] or [place]; pregnant women with a Medicaid card; and children who have applied for Medicaid or [place] Health Choice, but were declined, whose parents can pay a reduced fee at time of service.  [facility] Department of [facility]  [address], [place] [phone] Accepts children up to age 60 who are enrolled in [place] or [place]; pregnant women with a Medicaid card; and children who have applied for Medicaid or [place] Health Choice, but were declined, whose parents can pay a reduced fee at time of service.  [facility]  [place] [phone] Patients are seen by appointment only. Walk-ins are not accepted. [place] will see patients 48 years of age and older. Monday - Tuesday (8am-5pm) Most Wednesdays (8:30-5pm) $30 per visit, cash only  [facility] Adult Dental Access PROGRAM  [address], [facility] [phone] Patients are seen by appointment only.  Walk-ins are not accepted. [place] will see patients 11 years of age and older. One Wednesday Evening  (Monthly: Volunteer Based).  $30 per visit, cash only  [place]  [phone] for adults; Children under age 34, call Graduate Pediatric Dentistry at [phone]. Children aged 50-14, please call [phone] to request a pediatric application.  Dental services are provided in all areas of dental care including fillings, crowns and bridges, complete and partial dentures, implants, gum treatment, root canals, and extractions. Preventive care is also provided. Treatment is provided to both adults and children. Patients are selected via a lottery and there is often a waiting list.   [facility] [address], [place]  [phone] [email or web address]   [facility] [address] [place], [place] [phone], Ext. [phone] Second and Fourth Thursday of each month, opens at 6:30 AM; Clinic ends at 9 AM.  Patients are seen on a first-come first-served basis, and a limited number are seen during each clinic.   [facility]  [address] Hillard Danker [place], [place] [phone]   Eligibility Requirements You must have lived in [place], [place], or [place] counties for at least the last three months.   You cannot be eligible for state or federal sponsored [employer], including [employer], [place], or [employer].   You generally cannot be eligible for healthcare insurance through your employer.    How to apply: Eligibility screenings are held every Tuesday and Wednesday afternoon from 1:00 pm until 4:00 pm. You do not need an appointment for the interview!  [facility] [address] [place], [place]   [place]  [place] Department  [place]  [phone]    Behavioral Health Resources in the Community: Intensive Outpatient Programs Organization         Address  Phone  Notes  [place]. [address], [place], [place] [phone]   [facility] Outpatient [address], [place], [place]   ADS: Alcohol & Drug Svcs [address] [place], [place]   [place] [address]  [place], [place] or [phone]   Substance Abuse Resources Organization         Address  Phone  Notes  Alcohol and Drug Services  [phone]   [place]  [phone]   [facility] [place]   Chinita Pester  [phone]   Residential & Outpatient Substance Abuse Program  [phone]   Psychological Services Organization         Address  Phone  Notes  [facility] [place]  [place]  [phone]   [place] [address] [place] or [phone]    Mobile Crisis Teams Organization         Address  Phone  Notes  [facility], Mobile Crisis Care Unit  [phone]   [facility]  [address] [place], [place]   [place] Levels [address] [place] [phone]    Self-Help/Support Groups Organization         Address  Phone             Notes  [place]. of [place] - variety of support groups  [place] Call for more information  Narcotics  Anonymous (NA), [facility] [address], [employer] [place]  2 meetings at this location   Residential Facilities manager         Address  Phone  Notes  [facility] [place],    [place]  [phone]   [facility]  [address] [place] [id number], [place], [place]   [place], [place] [phone] Admissions: 8am-3pm M-F  Incentives Substance [place] [address]    [place], [place] [phone]   [facility] [address] [place],  [place], [place]   [facility] [address],  [place], [place]   [facility] - Intensive Outpatient [address] Kristeen Mans [address], [place], [place]   [facility] [address]  [place], [place] [phone] or [phone]   [facility] Services (RTS) [address] [place], [place] Accepts Medicaid  [facility] [place] [address]  [place] [phone] Substance Abuse/Addiction Treatment   [facility] Organization         Address  Phone  Notes  [facility]  [phone]   Domenic Schwab, PhD [address] Arlis Port[place], [place]   [phone] or [phone]   [place], [place] [phone]   [facility] [address], [place], [place] [phone] Insurance/Medicaid/sponsorship through [facility] and Families [address]                                    [place], [place] [phone] [place] [address], [place] [phone]    Dr. Adele Schilder  [phone]   [facility] [place] Dept. 1) [address], [place] 2) [place] 3)  [place], [place] [phone]  [phone]   [place] [phone] or [phone] (After Hours)

## 2014-09-06 NOTE — ED Provider Notes (Signed)
CSN: [id number]     Arrival date & time 09/06/14  4098 History   First MD Initiated Contact with Patient 09/06/14 0930     Chief Complaint  Patient presents with  . Diarrhea  . Back Pain     (Consider location/radiation/quality/duration/timing/severity/associated sxs/prior Treatment) HPI Steve Anderson is a 54 y.o. male with a history of chronic abdominal pain comes in for evaluation of acute abdominal pain and back pain. Patient states for the past 4 or 5 days he has had diffuse, sharp abdominal pain associated with increased frequency of bowel movements. Patient states he is having loose stools 2 or 3 times per day. Denies any bloody, dark or overtly foul smelling stools. He does report a change in diet and eating more biscuits and gravy over the past few weeks. Drinks 2-3, 12 ounce beers per day. Smokes half a pack per day. He denies any associated fevers, nausea, vomiting, dizziness, syncope. He also complains of lower left-sided back pain that he attributes to his work. Patient states on Monday he was speaking up a heavy box at work when he experienced a sharp pain in his lower left back. He has not tried anything to improve his symptoms. Denies any numbness, weakness, loss of bowel or bladder function. Denies chronic steroid use, IV drug use. Reports he did not want to try anything to improve his symptoms, "just wanted to come see you guys to figure out exactly what is going on". He reports he does not have a primary care and no one sees him for his symptoms. Past Medical History  Diagnosis Date  . Chronic pain    Past Surgical History  Procedure Laterality Date  . Orthopedic surgery     No family history on file. History  Substance Use Topics  . Smoking status: Current Every Day Smoker -- 0.50 packs/day    Types: Cigarettes  . Smokeless tobacco: Not on file  . Alcohol Use: Yes     Comment: daily    Review of Systems A 10 point review of systems was completed and was negative  except for pertinent positives and negatives as mentioned in the history of present illness     Allergies  Review of patient's allergies indicates no known allergies.  Home Medications   Prior to Admission medications   Medication Sig Start Date End Date Taking? Authorizing Provider  dextromethorphan-guaiFENesin (MUCINEX DM) 30-600 MG per 12 hr tablet Take 1 tablet by mouth 2 (two) times daily. Patient not taking: Reported on 05/11/2014 03/31/14   Mellody Drown, PA-C  dicyclomine (BENTYL) 20 MG tablet Take 1 tablet (20 mg total) by mouth 2 (two) times daily. 08/16/14   Gwyneth Sprout, MD  HYDROcodone-acetaminophen (NORCO/VICODIN) 5-325 MG per tablet Take 1-2 tablets by mouth every 6 (six) hours as needed. 08/16/14   Gwyneth Sprout, MD  ibuprofen (ADVIL,MOTRIN) 800 MG tablet Take 1 tablet (800 mg total) by mouth 3 (three) times daily. Patient taking differently: Take 800 mg by mouth 3 (three) times daily as needed for mild pain or moderate pain.  12/21/13   Jillyn Ledger, PA-C  ibuprofen (ADVIL,MOTRIN) 800 MG tablet Take 1 tablet (800 mg total) by mouth 3 (three) times daily. 03/31/14   Mellody Drown, PA-C  naproxen (NAPROSYN) 500 MG tablet Take 1 tablet (500 mg total) by mouth 2 (two) times daily. 09/06/14   Joycie Peek, PA-C  omeprazole (PRILOSEC) 20 MG capsule Take 1 capsule (20 mg total) by mouth daily. 09/06/14   Joycie Peek,  PA-C  pantoprazole (PROTONIX) 20 MG tablet Take 1 tablet (20 mg total) by mouth daily. Patient not taking: Reported on 08/16/2014 05/04/14   Oswaldo Conroy, PA-C  pantoprazole (PROTONIX) 20 MG tablet Take 1 tablet (20 mg total) by mouth daily. Patient not taking: Reported on 08/16/2014 05/12/14   Courtney Forcucci, PA-C   BP 128/70 mmHg  Pulse 54  Temp(Src) 97.4 F (36.3 C) (Oral)  Resp 18  Ht  (1.753 m)  Wt 200 lb (90.719 kg)  BMI 29.52 kg/m2  SpO2 97% Physical Exam  Constitutional: He is oriented to person, place, and time. He appears  well-developed and well-nourished.  HENT:  Head: Normocephalic and atraumatic.  Mouth/Throat: Oropharynx is clear and moist.  Eyes: Conjunctivae are normal. Pupils are equal, round, and reactive to light. Right eye exhibits no discharge. Left eye exhibits no discharge. No scleral icterus.  Neck: Normal range of motion. Neck supple.  Cardiovascular: Normal rate, regular rhythm and normal heart sounds.   Pulmonary/Chest: Effort normal and breath sounds normal. No respiratory distress. He has no wheezes. He has no rales.  Abdominal: Soft. He exhibits no distension and no mass. There is no tenderness. There is no rebound and no guarding.  Musculoskeletal: Normal range of motion. He exhibits no tenderness.  Mild tenderness to paraspinal lumbar muscles with no midline bony tenderness. No other lesions, deformities, crepitus or step offs.  Neurological: He is alert and oriented to person, place, and time.  Cranial Nerves II-XII grossly intact. Moves all 4 extremities without ataxia. Gait is baseline without ataxia or antalgia.  Skin: Skin is warm and dry. No rash noted.  Psychiatric: He has a normal mood and affect.  Nursing note and vitals reviewed.   ED Course  Procedures (including critical care time) Labs Review Labs Reviewed  CBC WITH DIFFERENTIAL/PLATELET - Abnormal; Notable for the following:    Platelets 147 (*)    All other components within normal limits  COMPREHENSIVE METABOLIC PANEL - Abnormal; Notable for the following:    Glucose, Bld 130 (*)    GFR calc non Af Amer 83 (*)    Anion gap 3 (*)    All other components within normal limits  LIPASE, BLOOD  URINALYSIS, ROUTINE W REFLEX MICROSCOPIC    Imaging Review No results found.   EKG Interpretation None     Meds given in ED:  Medications  sodium chloride 0.9 % bolus 1,000 mL (0 mLs Intravenous Stopped 09/06/14 1127)  acetaminophen (TYLENOL) tablet 1,000 mg (1,000 mg Oral Given 09/06/14 1236)    Discharge Medication  List as of 09/06/2014 12:24 PM    START taking these medications   Details  naproxen (NAPROSYN) 500 MG tablet Take 1 tablet (500 mg total) by mouth 2 (two) times daily., Starting 09/06/2014, Until Discontinued, Print       Filed Vitals:   09/06/14 1145 09/06/14 1200 09/06/14 1215 09/06/14 1237  BP: 114/75 117/83 114/77 128/70  Pulse: 55 48 49 54  Temp:      TempSrc:      Resp:    18  Height:      Weight:      SpO2: 96% 98% 96% 97%    MDM  Vitals stable - WNL -afebrile Pt resting comfortably in ED. Upon reevaluation 5:22 PM, patient is sleeping comfortably in ED bed in no apparent distress. No diarrhea in ED. PE--no evidence of acute or surgical abdomen. Normal neuro exam. Labwork noncontributory  DDX--patient has been seen multiple times in  ED for chronic abdominal pain. Pain is unchanged and is apparently related to eating habits. Patient has not established care with PCP. Discharged with resource guide. We will refill PPI. Encouraged him to continue taking his anti-spasmodics and encouraged less greasy diet. DC with naproxen for musculoskeletal back strain. No evidence of cauda equina, cord compression or other emergent pathology at this time. No pathologic back pain red flags.  I discussed all relevant lab findings and imaging results with pt and they verbalized understanding. Discussed f/u with PCP within 48 hrs and return precautions, pt very amenable to plan.  Final diagnoses:  Abdominal discomfort, generalized  Left-sided low back pain without sciatica       Joycie PeekBenjamin Leston Schueller, PA-C 09/07/14 1725  Mancel BaleElliott Wentz, MD 09/07/14 [phone]

## 2014-09-21 ENCOUNTER — Encounter (HOSPITAL_COMMUNITY): Payer: Self-pay | Admitting: *Deleted

## 2014-09-21 ENCOUNTER — Emergency Department (HOSPITAL_COMMUNITY)
Admission: EM | Admit: 2014-09-21 | Discharge: 2014-09-21 | Disposition: A | Discharge status: Home / Self Care | Payer: Self-pay | Attending: Emergency Medicine | Admitting: Emergency Medicine

## 2014-09-21 DIAGNOSIS — G8929 Other chronic pain: Secondary | ICD-10-CM | POA: Insufficient documentation

## 2014-09-21 DIAGNOSIS — Z791 Long term (current) use of non-steroidal anti-inflammatories (NSAID): Secondary | ICD-10-CM | POA: Insufficient documentation

## 2014-09-21 DIAGNOSIS — Z79899 Other long term (current) drug therapy: Secondary | ICD-10-CM | POA: Insufficient documentation

## 2014-09-21 DIAGNOSIS — R300 Dysuria: Secondary | ICD-10-CM | POA: Insufficient documentation

## 2014-09-21 DIAGNOSIS — Z720 Tobacco use: Secondary | ICD-10-CM | POA: Insufficient documentation

## 2014-09-21 DIAGNOSIS — Z202 Contact with and (suspected) exposure to infections with a predominantly sexual mode of transmission: Secondary | ICD-10-CM | POA: Insufficient documentation

## 2014-09-21 DIAGNOSIS — R350 Frequency of micturition: Secondary | ICD-10-CM | POA: Insufficient documentation

## 2014-09-21 LAB — URINALYSIS, ROUTINE W REFLEX MICROSCOPIC
Bilirubin Urine: NEGATIVE
Glucose, UA: NEGATIVE mg/dL
Hgb urine dipstick: NEGATIVE
Ketones, ur: NEGATIVE mg/dL
Leukocytes, UA: NEGATIVE
Nitrite: NEGATIVE
Protein, ur: NEGATIVE mg/dL
Specific Gravity, Urine: 1.015 (ref 1.005–1.030)
Urobilinogen, UA: 0.2 mg/dL (ref 0.0–1.0)
pH: 5.5 (ref 5.0–8.0)

## 2014-09-21 LAB — CBC WITH DIFFERENTIAL/PLATELET
Basophils Absolute: 0 10*3/uL (ref 0.0–0.1)
Basophils Relative: 1 % (ref 0–1)
Eosinophils Absolute: 0 10*3/uL (ref 0.0–0.7)
Eosinophils Relative: 1 % (ref 0–5)
HCT: 43.3 % (ref 39.0–52.0)
Hemoglobin: 14.9 g/dL (ref 13.0–17.0)
Lymphocytes Relative: 38 % (ref 12–46)
Lymphs Abs: 1.4 10*3/uL (ref 0.7–4.0)
MCH: 30.3 pg (ref 26.0–34.0)
MCHC: 34.4 g/dL (ref 30.0–36.0)
MCV: 88.2 fL (ref 78.0–100.0)
Monocytes Absolute: 0.4 10*3/uL (ref 0.1–1.0)
Monocytes Relative: 10 % (ref 3–12)
Neutro Abs: 1.8 10*3/uL (ref 1.7–7.7)
Neutrophils Relative %: 50 % (ref 43–77)
Platelets: 135 10*3/uL — ABNORMAL LOW (ref 150–400)
RBC: 4.91 MIL/uL (ref 4.22–5.81)
RDW: 13.4 % (ref 11.5–15.5)
WBC: 3.6 10*3/uL — ABNORMAL LOW (ref 4.0–10.5)

## 2014-09-21 LAB — COMPREHENSIVE METABOLIC PANEL
ALT: 11 U/L (ref 0–53)
AST: 14 U/L (ref 0–37)
Albumin: 3.6 g/dL (ref 3.5–5.2)
Alkaline Phosphatase: 53 U/L (ref 39–117)
Anion gap: 10 (ref 5–15)
BUN: 12 mg/dL (ref 6–23)
CO2: 23 mmol/L (ref 19–32)
Calcium: 9.1 mg/dL (ref 8.4–10.5)
Chloride: 107 mmol/L (ref 96–112)
Creatinine, Ser: 0.97 mg/dL (ref 0.50–1.35)
GFR calc Af Amer: >90 mL/min (ref >90)
GFR calc non Af Amer: >90 mL/min (ref >90)
Glucose, Bld: 85 mg/dL (ref 70–99)
Potassium: 4.1 mmol/L (ref 3.5–5.1)
Sodium: 140 mmol/L (ref 135–145)
Total Bilirubin: 0.9 mg/dL (ref 0.3–1.2)
Total Protein: 6.1 g/dL (ref 6.0–8.3)

## 2014-09-21 LAB — LIPASE, BLOOD: Lipase: 31 U/L (ref 11–59)

## 2014-09-21 MED ORDER — ONDANSETRON HCL 4 MG PO TABS
4.0000 mg | ORAL_TABLET | Freq: Once | ORAL | Status: AC
  Administered 2014-09-21: 4 mg via ORAL
  Filled 2014-09-21: qty 1

## 2014-09-21 MED ORDER — HYDROCODONE-ACETAMINOPHEN 5-325 MG PO TABS
1.0000 | ORAL_TABLET | Freq: Once | ORAL | Status: AC
  Administered 2014-09-21: 1 via ORAL
  Filled 2014-09-21: qty 1

## 2014-09-21 MED ORDER — LIDOCAINE HCL (PF) 1 % IJ SOLN
1.0000 mg | Freq: Once | INTRAMUSCULAR | Status: DC
  Filled 2014-09-21: qty 5

## 2014-09-21 MED ORDER — CEFTRIAXONE SODIUM 250 MG IJ SOLR
250.0000 mg | Freq: Once | INTRAMUSCULAR | Status: AC
  Administered 2014-09-21: 250 mg via INTRAMUSCULAR
  Filled 2014-09-21: qty 250

## 2014-09-21 MED ORDER — AZITHROMYCIN 1 G PO PACK
1.0000 g | PACK | Freq: Once | ORAL | Status: AC
  Administered 2014-09-21: 1 g via ORAL
  Filled 2014-09-21: qty 1

## 2014-09-21 MED ORDER — LIDOCAINE HCL (PF) 1 % IJ SOLN
1.0000 mL | Freq: Once | INTRAMUSCULAR | Status: AC
  Administered 2014-09-21: 1 mL

## 2014-09-21 NOTE — ED Provider Notes (Signed)
CSN: [id number]     Arrival date & time 09/21/14  9147 History   First MD Initiated Contact with Patient 09/21/14 [phone]     Chief Complaint  Patient presents with  . Flank Pain     (Consider location/radiation/quality/duration/timing/severity/associated sxs/prior Treatment) HPI   This is a 54 year old male with past history of chronic pain, who comes in with complaint of bilateral flank pain, dysuria, urine malodor for 4 days. The flank pain has been constant, bilateral, sharp. He notes he had unprotected sexual intercourse recently and that he has had gonorrhea in the past and feels like this is similar to previous episode of gonorrhea. Denies any constipation, diarrhea, fever, nausea/vomiting. Denies testicular pain.  Past Medical History  Diagnosis Date  . Chronic pain    Past Surgical History  Procedure Laterality Date  . Orthopedic surgery     No family history on file. History  Substance Use Topics  . Smoking status: Current Every Day Smoker -- 0.50 packs/day    Types: Cigarettes  . Smokeless tobacco: Not on file  . Alcohol Use: Yes     Comment: daily    Review of Systems  Constitutional: Negative for fever and chills.  Eyes: Negative for redness.  Respiratory: Negative for cough and shortness of breath.   Cardiovascular: Negative for chest pain.  Gastrointestinal: Negative for nausea, vomiting, abdominal pain and diarrhea.  Genitourinary: Positive for dysuria, frequency and flank pain. Negative for testicular pain.  Skin: Negative for rash.  Neurological: Negative for headaches.  All other systems reviewed and are negative.     Allergies  Review of patient's allergies indicates no known allergies.  Home Medications   Prior to Admission medications   Medication Sig Start Date End Date Taking? Authorizing Provider  HYDROcodone-acetaminophen (NORCO/VICODIN) 5-325 MG per tablet Take 1-2 tablets by mouth every 6 (six) hours as needed. 08/16/14  Yes Gwyneth Sprout, MD  dextromethorphan-guaiFENesin (MUCINEX DM) 30-600 MG per 12 hr tablet Take 1 tablet by mouth 2 (two) times daily. Patient not taking: Reported on 05/11/2014 03/31/14   Mellody Drown, PA-C  dicyclomine (BENTYL) 20 MG tablet Take 1 tablet (20 mg total) by mouth 2 (two) times daily. Patient not taking: Reported on 09/21/2014 08/16/14   Gwyneth Sprout, MD  ibuprofen (ADVIL,MOTRIN) 800 MG tablet Take 1 tablet (800 mg total) by mouth 3 (three) times daily. Patient taking differently: Take 800 mg by mouth 3 (three) times daily as needed for mild pain or moderate pain.  12/21/13   Jillyn Ledger, PA-C  ibuprofen (ADVIL,MOTRIN) 800 MG tablet Take 1 tablet (800 mg total) by mouth 3 (three) times daily. Patient not taking: Reported on 09/21/2014 03/31/14   Mellody Drown, PA-C  naproxen (NAPROSYN) 500 MG tablet Take 1 tablet (500 mg total) by mouth 2 (two) times daily. Patient not taking: Reported on 09/21/2014 09/06/14   Joycie Peek, PA-C  omeprazole (PRILOSEC) 20 MG capsule Take 1 capsule (20 mg total) by mouth daily. Patient not taking: Reported on 09/21/2014 09/06/14   Joycie Peek, PA-C  pantoprazole (PROTONIX) 20 MG tablet Take 1 tablet (20 mg total) by mouth daily. Patient not taking: Reported on 08/16/2014 05/04/14   Oswaldo Conroy, PA-C  pantoprazole (PROTONIX) 20 MG tablet Take 1 tablet (20 mg total) by mouth daily. Patient not taking: Reported on 08/16/2014 05/12/14   Toni Amend Forcucci, PA-C   BP 123/72 mmHg  Pulse 55  Temp(Src) 98.3 F (36.8 C) (Oral)  Resp 16  Ht  (1.753 m)  Wt 200 lb (90.719 kg)  BMI 29.52 kg/m2  SpO2 97% Physical Exam  Constitutional: He is oriented to person, place, and time. No distress.  HENT:  Head: Normocephalic and atraumatic.  Eyes: EOM are normal. Pupils are equal, round, and reactive to light.  Neck: Normal range of motion. Neck supple.  Cardiovascular: Normal rate.   Pulmonary/Chest: Effort normal. No respiratory distress.  Abdominal:  Soft. There is no tenderness. There is CVA tenderness.  Genitourinary: Right testis shows no tenderness. Left testis shows no tenderness. No discharge found.  Musculoskeletal: Normal range of motion.  Neurological: He is alert and oriented to person, place, and time.  Skin: No rash noted. He is not diaphoretic.  Psychiatric: He has a normal mood and affect.    ED Course  Procedures (including critical care time) Labs Review Labs Reviewed  CBC WITH DIFFERENTIAL/PLATELET - Abnormal; Notable for the following:    WBC 3.6 (*)    Platelets 135 (*)    All other components within normal limits  URINALYSIS, ROUTINE W REFLEX MICROSCOPIC  COMPREHENSIVE METABOLIC PANEL  LIPASE, BLOOD  RPR  HIV ANTIBODY (ROUTINE TESTING)  GC/CHLAMYDIA PROBE AMP ([place])    Imaging Review No results found.   EKG Interpretation None      MDM   Final diagnoses:  Possible exposure to STD  Dysuria    This is a 54 year old male with past history of chronic pain, who comes in with complaint of bilateral flank pain, dysuria, urine malodor for 4 days  Exam as above, bilat mild CVA ttp.  No testicular ttp.  No obvious penile discharge.  Will send gc/chl.  Will send rpr/hiv which patient consents to.  Will send bmp to eval renal function as he has bilat flank pain.  Will empirically treat for gc/chl with rocephin/azithro.  Labs WNL.  Patient continues to be well appearing, benign abdominal exam.  Feel safe for d/c at this time.  Have discussed with patient that rpr/hiv will take time to come back and that he will be called with positive results.  I have discussed the results, Dx and Tx plan with the patient. They expressed understanding and agree with the plan and were told to return to ED with any worsening of condition or concern.    Disposition: Discharge  Condition: Good  New Prescriptions   No medications on file    [facility] [address] [id number] [place] [id number] [phone]  As needed   Pt seen in conjunction with Dr. Imagene ShellerSteinl    Theseus Birnie, MD 09/21/14 1052  Cathren LaineKevin Steinl, MD 09/21/14 1816

## 2014-09-21 NOTE — Discharge Instructions (Signed)

## 2014-09-21 NOTE — ED Notes (Signed)
Pt states that he has bilateral flank pain that started 4-5 days ago. Pt states that he has pain when he urinates and that his urine has a strong smell. Pt also reports that he has had unprotected sex with a woman recently and is concerned for an STD.

## 2014-09-22 LAB — RPR: RPR Ser Ql: NONREACTIVE

## 2014-09-22 LAB — HIV ANTIBODY (ROUTINE TESTING W REFLEX): HIV Screen 4th Generation wRfx: NONREACTIVE

## 2014-09-22 LAB — GC/CHLAMYDIA PROBE AMP (~~LOC~~) NOT AT ARMC
Chlamydia: NEGATIVE
Neisseria Gonorrhea: NEGATIVE

## 2014-10-19 ENCOUNTER — Encounter (HOSPITAL_COMMUNITY): Payer: Self-pay | Admitting: Emergency Medicine

## 2014-10-19 ENCOUNTER — Emergency Department (HOSPITAL_COMMUNITY)
Admission: EM | Admit: 2014-10-19 | Discharge: 2014-10-19 | Disposition: A | Discharge status: Home / Self Care | Payer: Self-pay | Attending: Emergency Medicine | Admitting: Emergency Medicine

## 2014-10-19 DIAGNOSIS — K029 Dental caries, unspecified: Secondary | ICD-10-CM | POA: Insufficient documentation

## 2014-10-19 DIAGNOSIS — K047 Periapical abscess without sinus: Secondary | ICD-10-CM | POA: Insufficient documentation

## 2014-10-19 DIAGNOSIS — Z720 Tobacco use: Secondary | ICD-10-CM | POA: Insufficient documentation

## 2014-10-19 DIAGNOSIS — G8929 Other chronic pain: Secondary | ICD-10-CM | POA: Insufficient documentation

## 2014-10-19 MED ORDER — HYDROCODONE-ACETAMINOPHEN 5-325 MG PO TABS: ORAL_TABLET | ORAL | Status: DC

## 2014-10-19 MED ORDER — PENICILLIN V POTASSIUM 250 MG PO TABS
500.0000 mg | ORAL_TABLET | Freq: Once | ORAL | Status: AC
  Administered 2014-10-19: 500 mg via ORAL
  Filled 2014-10-19: qty 2

## 2014-10-19 MED ORDER — PENICILLIN V POTASSIUM 500 MG PO TABS: 500.0000 mg | ORAL_TABLET | Freq: Three times a day (TID) | ORAL | Status: DC

## 2014-10-19 MED ORDER — HYDROCODONE-ACETAMINOPHEN 5-325 MG PO TABS
1.0000 | ORAL_TABLET | Freq: Once | ORAL | Status: AC
  Administered 2014-10-19: 1 via ORAL
  Filled 2014-10-19: qty 1

## 2014-10-19 NOTE — Discharge Instructions (Signed)
Please read and follow all provided instructions.  Your diagnoses today include:  1. Dental abscess     The exam and treatment you received today has been provided on an emergency basis only. This is not a substitute for complete medical or dental care.  Tests performed today include:  Vital signs. See below for your results today.   Medications prescribed:   Penicillin - antibiotic  You have been prescribed an antibiotic medicine: take the entire course of medicine even if you are feeling better. Stopping early can cause the antibiotic not to work.   Vicodin (hydrocodone/acetaminophen) - narcotic pain medication  DO NOT drive or perform any activities that require you to be awake and alert because this medicine can make you drowsy. BE VERY CAREFUL not to take multiple medicines containing Tylenol (also called acetaminophen). Doing so can lead to an overdose which can damage your liver and cause liver failure and possibly death.  Take any prescribed medications only as directed.  Home care instructions:  Follow any educational materials contained in this packet.  Follow-up instructions: Please follow-up with your dentist for further evaluation of your symptoms.   Dental Assistance: See below for dental referrals  Return instructions:   Please return to the Emergency Department if you experience worsening symptoms.  Please return if you develop a fever, you develop more swelling in your face or neck, you have trouble breathing or swallowing food.  Please return if you have any other emergent concerns.  Additional Information:  Your vital signs today were: BP 153/82 mmHg   Pulse 76   Temp(Src) 99.9 F (37.7 C) (Oral)   Resp 16   Ht 5\' 9"  (1.753 m)   Wt 183 lb (83.008 kg)   BMI 27.01 kg/m2   SpO2 98% If your blood pressure (BP) was elevated above 135/85 this visit, please have this repeated by your doctor within one month. -------------- Dental Care: Organization          Address  Phone  Notes  [facility] [address], [place] [phone] Accepts children up to age 53 who are enrolled in [place] or [place] Health Choice; pregnant women with a Medicaid card; and children who have applied for Medicaid or [place] Health Choice, but were declined, whose parents can pay a reduced fee at time of service.  [facility] [place]  [address], [place] [phone] Accepts children up to age 53 who are enrolled in [place] or [place] Health Choice; pregnant women with a Medicaid card; and children who have applied for Medicaid or [place] Health Choice, but were declined, whose parents can pay a reduced fee at time of service.  [facility]  [address], [place] [phone] Patients are seen by appointment only. Walk-ins are not accepted. [facility] will see patients 53 years of age and older. Monday - Tuesday (8am-5pm) Most Wednesdays (8:30-5pm) $30 per visit, cash only  [facility]  [address], [facility] [place] [phone] Patients are seen by appointment only. Walk-ins are not accepted. [facility] will see patients 53 years of age and older. One Wednesday Evening (Monthly: Volunteer Based).  $30 per visit, cash only  [facility]  [phone] for adults; Children under age 14, call Graduate Pediatric Dentistry at [phone]. Children aged 794-14, please call [phone] to request a pediatric  application.  Dental services are provided in all areas of dental care including fillings, crowns and bridges, complete and partial dentures, implants, gum treatment, root canals, and extractions. Preventive care is also provided. Treatment is provided to both adults and children. Patients are selected via a lottery and there is often a waiting list.   [facility] [address], [place]  [phone] [email or web address]   [facility] [address], [place], [place] [phone], Ext. [phone] Second and Fourth Thursday of each month, opens at 6:30 AM; Clinic ends at 9 AM.  Patients are seen on a first-come first-served basis, and a limited number are seen during each clinic.   [facility]  [address], [place], [place] [phone]   Eligibility Requirements You must have lived in [place], [place], or [place] counties for at least the last three months.   You cannot be eligible for state or federal sponsored [place]healthcare insurance, including [employer], [place]icaid, or [employer]icare.   You generally cannot be eligible for healthcare insurance through your employer.    How to apply: Eligibility screenings are held every Tuesday and Wednesday afternoon from 1:00 pm until 4:00 pm. You do not need an appointment for the interview!  [facility] [address], [place], [place] [phone]   [facility]  [phone]   [facility]  [phone]   [facility]  [phone]

## 2014-10-19 NOTE — ED Provider Notes (Signed)
CSN: [id number]     Arrival date & time 10/19/14  2035 History  This chart was scribed for non-physician practitioner Renne CriglerJoshua Everline Mahaffy, PA-C working with Jerelyn ScottMartha Linker, MD by Murriel HopperAlec Bankhead, ED Scribe. This patient was seen in room TR05C/TR05C and the patient's care was started at 8:55 PM.  Chief Complaint  Patient presents with  . Dental Pain    The history is provided by the patient. No language interpreter was used.     HPI Comments: [name] is a 54 y.o. male who presents to the Emergency Department complaining of constant, worsening maxillary dental pain that has been present for three days. Pt reports that he has not seen a dentist about this tooth prior to coming to ED. Pt reports taking Ibuprofen regularly over the past couple of days with no relief. No neck swelling, trouble swallowing. No fever.     Past Medical History  Diagnosis Date  . Chronic pain    Past Surgical History  Procedure Laterality Date  . Orthopedic surgery     No family history on file. History  Substance Use Topics  . Smoking status: Current Every Day Smoker -- 0.50 packs/day    Types: Cigarettes  . Smokeless tobacco: Not on file  . Alcohol Use: Yes     Comment: daily    Review of Systems  Constitutional: Negative for fever.  HENT: Positive for dental problem and facial swelling. Negative for ear pain, sore throat and trouble swallowing.   Respiratory: Negative for shortness of breath and stridor.   Musculoskeletal: Negative for neck pain.  Skin: Negative for color change.  Neurological: Negative for headaches.      Allergies  Review of patient's allergies indicates no known allergies.  Home Medications   Prior to Admission medications   Medication Sig Start Date End Date Taking? Authorizing Provider  dextromethorphan-guaiFENesin (MUCINEX DM) 30-600 MG per 12 hr tablet Take 1 tablet by mouth 2 (two) times daily. Patient not taking: Reported on 05/11/2014 03/31/14   Mellody DrownLauren Parker, PA-C   dicyclomine (BENTYL) 20 MG tablet Take 1 tablet (20 mg total) by mouth 2 (two) times daily. Patient not taking: Reported on 09/21/2014 08/16/14   Gwyneth SproutWhitney Plunkett, MD  HYDROcodone-acetaminophen (NORCO/VICODIN) 5-325 MG per tablet Take 1-2 tablets by mouth every 6 (six) hours as needed. 08/16/14   Gwyneth SproutWhitney Plunkett, MD  ibuprofen (ADVIL,MOTRIN) 800 MG tablet Take 1 tablet (800 mg total) by mouth 3 (three) times daily. Patient taking differently: Take 800 mg by mouth 3 (three) times daily as needed for mild pain or moderate pain.  12/21/13   Jillyn LedgerJessica K Palmer, PA-C  ibuprofen (ADVIL,MOTRIN) 800 MG tablet Take 1 tablet (800 mg total) by mouth 3 (three) times daily. Patient not taking: Reported on 09/21/2014 03/31/14   Mellody DrownLauren Parker, PA-C  naproxen (NAPROSYN) 500 MG tablet Take 1 tablet (500 mg total) by mouth 2 (two) times daily. Patient not taking: Reported on 09/21/2014 09/06/14   Joycie PeekBenjamin Cartner, PA-C  omeprazole (PRILOSEC) 20 MG capsule Take 1 capsule (20 mg total) by mouth daily. Patient not taking: Reported on 09/21/2014 09/06/14   Joycie PeekBenjamin Cartner, PA-C  pantoprazole (PROTONIX) 20 MG tablet Take 1 tablet (20 mg total) by mouth daily. Patient not taking: Reported on 08/16/2014 05/04/14   Oswaldo ConroyVictoria Creech, PA-C  pantoprazole (PROTONIX) 20 MG tablet Take 1 tablet (20 mg total) by mouth daily. Patient not taking: Reported on 08/16/2014 05/12/14   Toni Amendourtney Forcucci, PA-C   BP 153/82 mmHg  Pulse 76  Temp(Src)  99.9 F (37.7 C) (Oral)  Resp 16  Ht  (1.753 m)  Wt 183 lb (83.008 kg)  BMI 27.01 kg/m2  SpO2 98%   Physical Exam  Constitutional: He is oriented to person, place, and time. He appears well-developed and well-nourished.  HENT:  Head: Normocephalic and atraumatic.  Right Ear: Tympanic membrane, external ear and ear canal normal.  Left Ear: Tympanic membrane, external ear and ear canal normal.  Nose: Nose normal.  Mouth/Throat: Uvula is midline, oropharynx is clear and moist and mucous  membranes are normal. No trismus in the jaw. Abnormal dentition. Dental caries present. No dental abscesses or uvula swelling. No tonsillar abscesses.  Patient with L maxillary tooth pain and tenderness to palpation in area of 2nd incisor which is broken. There is erythema of the gums at the base of this tooth with a small superficial pustule. No large gross abscess.  Eyes: Pupils are equal, round, and reactive to light.  Neck: Normal range of motion. Neck supple.  No neck swelling or Lugwig's angina  Cardiovascular: Normal rate.   Pulmonary/Chest: Effort normal.  Abdominal: He exhibits no distension.  Neurological: He is alert and oriented to person, place, and time.  Skin: Skin is warm and dry.  Psychiatric: He has a normal mood and affect.  Nursing note and vitals reviewed.   ED Course  Procedures (including critical care time)  DIAGNOSTIC STUDIES: Oxygen Saturation is 98% on room air, normal by my interpretation.    COORDINATION OF CARE: 8:56 PM Discussed treatment plan with pt at bedside and pt agreed to plan.   Labs Review Labs Reviewed - No data to display  Imaging Review No results found.   EKG Interpretation None       9:05 PM Patient seen and examined. Medications ordered.   Vital signs reviewed and are as follows: BP 153/82 mmHg  Pulse 76  Temp(Src) 99.9 F (37.7 C) (Oral)  Resp 16  Ht  (1.753 m)  Wt 183 lb (83.008 kg)  BMI 27.01 kg/m2  SpO2 98%  Patient counseled on use of narcotic pain medications. Counseled not to combine these medications with others containing tylenol. Urged not to drink alcohol, drive, or perform any other activities that requires focus while taking these medications. The patient verbalizes understanding and agrees with the plan.  Patient counseled to take prescribed medications as directed, return with worsening facial or neck swelling, and to follow-up with their dentist as soon as possible.    MDM   Final diagnoses:   Dental abscess   Patient with toothache. No fever. Exam unconcerning for Ludwig's angina or other deep tissue infection in neck.   As there is gum swelling, erythema, will treat with antibiotic and pain medicine. Urged patient to follow-up with dentist.         I personally performed the services described in this documentation, which was scribed in my presence. The recorded information has been reviewed and is accurate.    Renne Crigler, PA-C 10/19/14 2106  [name], MD 10/19/14 2200

## 2014-10-19 NOTE — ED Notes (Signed)
Pt. reports persistent left upper molar pain for 3 days unrelieved by OTC pain medication .

## 2014-12-19 ENCOUNTER — Encounter (HOSPITAL_COMMUNITY): Payer: Self-pay | Admitting: *Deleted

## 2014-12-19 ENCOUNTER — Emergency Department (HOSPITAL_COMMUNITY)
Admission: EM | Admit: 2014-12-19 | Discharge: 2014-12-19 | Disposition: A | Discharge status: Home / Self Care | Payer: Self-pay | Attending: Emergency Medicine | Admitting: Emergency Medicine

## 2014-12-19 ENCOUNTER — Emergency Department (HOSPITAL_COMMUNITY): Payer: Self-pay

## 2014-12-19 DIAGNOSIS — Z720 Tobacco use: Secondary | ICD-10-CM | POA: Insufficient documentation

## 2014-12-19 DIAGNOSIS — R197 Diarrhea, unspecified: Secondary | ICD-10-CM | POA: Insufficient documentation

## 2014-12-19 DIAGNOSIS — R0602 Shortness of breath: Secondary | ICD-10-CM | POA: Insufficient documentation

## 2014-12-19 DIAGNOSIS — G8929 Other chronic pain: Secondary | ICD-10-CM | POA: Insufficient documentation

## 2014-12-19 DIAGNOSIS — R079 Chest pain, unspecified: Secondary | ICD-10-CM | POA: Insufficient documentation

## 2014-12-19 DIAGNOSIS — M549 Dorsalgia, unspecified: Secondary | ICD-10-CM | POA: Insufficient documentation

## 2014-12-19 DIAGNOSIS — R1084 Generalized abdominal pain: Secondary | ICD-10-CM | POA: Insufficient documentation

## 2014-12-19 LAB — URINALYSIS, ROUTINE W REFLEX MICROSCOPIC
Glucose, UA: NEGATIVE mg/dL
Hgb urine dipstick: NEGATIVE
Ketones, ur: NEGATIVE mg/dL
Leukocytes, UA: NEGATIVE
Nitrite: NEGATIVE
Protein, ur: NEGATIVE mg/dL
Specific Gravity, Urine: 1.02 (ref 1.005–1.030)
Urobilinogen, UA: 1 mg/dL (ref 0.0–1.0)
pH: 5.5 (ref 5.0–8.0)

## 2014-12-19 LAB — COMPREHENSIVE METABOLIC PANEL
ALT: 13 U/L — ABNORMAL LOW (ref 17–63)
AST: 16 U/L (ref 15–41)
Albumin: 3.5 g/dL (ref 3.5–5.0)
Alkaline Phosphatase: 63 U/L (ref 38–126)
Anion gap: 7 (ref 5–15)
BUN: 9 mg/dL (ref 6–20)
CO2: 24 mmol/L (ref 22–32)
Calcium: 8.7 mg/dL — ABNORMAL LOW (ref 8.9–10.3)
Chloride: 108 mmol/L (ref 101–111)
Creatinine, Ser: 0.96 mg/dL (ref 0.61–1.24)
GFR calc Af Amer: >60 mL/min (ref >60)
GFR calc non Af Amer: >60 mL/min (ref >60)
Glucose, Bld: 143 mg/dL — ABNORMAL HIGH (ref 65–99)
Potassium: 3.2 mmol/L — ABNORMAL LOW (ref 3.5–5.1)
Sodium: 139 mmol/L (ref 135–145)
Total Bilirubin: 0.4 mg/dL (ref 0.3–1.2)
Total Protein: 6.4 g/dL — ABNORMAL LOW (ref 6.5–8.1)

## 2014-12-19 LAB — CBC WITH DIFFERENTIAL/PLATELET
Basophils Absolute: 0 10*3/uL (ref 0.0–0.1)
Basophils Relative: 0 % (ref 0–1)
Eosinophils Absolute: 0.1 10*3/uL (ref 0.0–0.7)
Eosinophils Relative: 2 % (ref 0–5)
HCT: 42.1 % (ref 39.0–52.0)
Hemoglobin: 14.4 g/dL (ref 13.0–17.0)
Lymphocytes Relative: 42 % (ref 12–46)
Lymphs Abs: 1.3 10*3/uL (ref 0.7–4.0)
MCH: 30.6 pg (ref 26.0–34.0)
MCHC: 34.2 g/dL (ref 30.0–36.0)
MCV: 89.6 fL (ref 78.0–100.0)
Monocytes Absolute: 0.2 10*3/uL (ref 0.1–1.0)
Monocytes Relative: 8 % (ref 3–12)
Neutro Abs: 1.5 10*3/uL — ABNORMAL LOW (ref 1.7–7.7)
Neutrophils Relative %: 48 % (ref 43–77)
Platelets: 150 10*3/uL (ref 150–400)
RBC: 4.7 MIL/uL (ref 4.22–5.81)
RDW: 13.6 % (ref 11.5–15.5)
WBC: 3 10*3/uL — ABNORMAL LOW (ref 4.0–10.5)

## 2014-12-19 LAB — I-STAT TROPONIN, ED: Troponin i, poc: 0 ng/mL (ref 0.00–0.08)

## 2014-12-19 LAB — LIPASE, BLOOD: Lipase: 28 U/L (ref 22–51)

## 2014-12-19 LAB — BRAIN NATRIURETIC PEPTIDE: B Natriuretic Peptide: 11.9 pg/mL (ref 0.0–100.0)

## 2014-12-19 MED ORDER — GI COCKTAIL ~~LOC~~
30.0000 mL | Freq: Once | ORAL | Status: AC
  Administered 2014-12-19: 30 mL via ORAL
  Filled 2014-12-19 (×2): qty 30

## 2014-12-19 MED ORDER — DICYCLOMINE HCL 20 MG PO TABS: 20.0000 mg | ORAL_TABLET | Freq: Two times a day (BID) | ORAL | Status: DC

## 2014-12-19 MED ORDER — DICYCLOMINE HCL 10 MG/ML IM SOLN
20.0000 mg | Freq: Once | INTRAMUSCULAR | Status: AC
  Administered 2014-12-19: 20 mg via INTRAMUSCULAR
  Filled 2014-12-19: qty 2

## 2014-12-19 MED ORDER — FAMOTIDINE 20 MG PO TABS: 20.0000 mg | ORAL_TABLET | Freq: Two times a day (BID) | ORAL | Status: DC

## 2014-12-19 MED ORDER — POTASSIUM CHLORIDE CRYS ER 20 MEQ PO TBCR
40.0000 meq | EXTENDED_RELEASE_TABLET | Freq: Once | ORAL | Status: AC
  Administered 2014-12-19: 40 meq via ORAL
  Filled 2014-12-19: qty 2

## 2014-12-19 MED ORDER — IOHEXOL 300 MG/ML  SOLN
80.0000 mL | Freq: Once | INTRAMUSCULAR | Status: AC | PRN
  Administered 2014-12-19: 80 mL via INTRAVENOUS

## 2014-12-19 NOTE — Discharge Instructions (Signed)
Take bentyl and pepcid daily. [facility]    Abdominal Pain Many things can cause abdominal pain. Usually, abdominal pain is not caused by a disease and will improve without treatment. It can often be observed and treated at home. Your health care provider will do a physical exam and possibly order blood tests and X-rays to help determine the seriousness of your pain. However, in many cases, more time must pass before a clear cause of the pain can be found. Before that point, your health care provider may not know if you need more testing or further treatment. HOME CARE INSTRUCTIONS  Monitor your abdominal pain for any changes. The following actions may help to alleviate any discomfort you are experiencing:  Only take over-the-counter or prescription medicines as directed by your health care provider.  Do not take laxatives unless directed to do so by your health care provider.  Try a clear liquid diet (broth, tea, or water) as directed by your health care provider. Slowly move to a bland diet as tolerated. SEEK MEDICAL CARE IF:  You have unexplained abdominal pain.  You have abdominal pain associated with nausea or diarrhea.  You have pain when you urinate or have a bowel movement.  You experience abdominal pain that wakes you in the night.  You have abdominal pain that is worsened or improved by eating food.  You have abdominal pain that is worsened with eating fatty foods.  You have a fever. SEEK IMMEDIATE MEDICAL CARE IF:   Your pain does not go away within 2 hours.  You keep throwing up (vomiting).  Your pain is felt only in portions of the abdomen, such as the right side or the left lower portion of the abdomen.  You pass bloody or black tarry stools. MAKE SURE YOU:  Understand these instructions.   Will watch your condition.   Will get help right away if you are not doing well or get worse.  Document  Released: 03/15/2005 Document Revised: 06/10/2013 Document Reviewed: 02/12/2013 [facility] Patient Information 2015 [place] [place]. This information is not intended to replace advice given to you by your health care provider. Make sure you discuss any questions you have with your health care provider.

## 2014-12-19 NOTE — ED Provider Notes (Signed)
CSN: [id number]     Arrival date & time 12/19/14  0831 History   First MD Initiated Contact with Patient 12/19/14 [phone]     Chief Complaint  Patient presents with  . Abdominal Pain  . Chest Pain     (Consider location/radiation/quality/duration/timing/severity/associated sxs/prior Treatment) HPI Steve Anderson is a 54 y.o. male with history of chronic pain, who presents to emergency department complaining of abdominal pain, chest pain, back pain. Patient states his symptoms started approximately 5 days ago. He states pain in his chest and abdomen started the same time. Patient is pointing all over his chest and abdomen, but states pain is worse in the center of the chest, lower abdomen, states it wraps around to the back. He admits to diarrhea for last several days. He denies any nausea or vomiting however states after he eats sometimes he will have nausea and increased pain. Patient denies any urinary symptoms. He denies any fever or chills. He denies dark tarry stools or blood in his sputum. He states he took over-the-counter pain medication with no relief. He admits to prior similar pain, states he does not remember what he was told he had. Patient states chest pain is not associated with exertion. He states is having "some shortness of breath," denies any dizziness or lightheadedness. He has no other complaints.  Past Medical History  Diagnosis Date  . Chronic pain    Past Surgical History  Procedure Laterality Date  . Orthopedic surgery     No family history on file. History  Substance Use Topics  . Smoking status: Current Every Day Smoker -- 0.50 packs/day    Types: Cigarettes  . Smokeless tobacco: Not on file  . Alcohol Use: 16.8 oz/week    28 Cans of beer per week     Comment: daily    Review of Systems  Constitutional: Negative for fever and chills.  Respiratory: Positive for chest tightness and shortness of breath. Negative for cough.   Cardiovascular: Positive for chest  pain. Negative for palpitations and leg swelling.  Gastrointestinal: Positive for abdominal pain and diarrhea. Negative for nausea, vomiting, constipation and abdominal distention.  Genitourinary: Negative for dysuria, urgency, frequency and hematuria.  Musculoskeletal: Positive for back pain. Negative for myalgias, arthralgias, neck pain and neck stiffness.  Skin: Negative for rash.  Allergic/Immunologic: Negative for immunocompromised state.  Neurological: Negative for dizziness, weakness, light-headedness, numbness and headaches.  All other systems reviewed and are negative.     Allergies  Review of patient's allergies indicates no known allergies.  Home Medications   Prior to Admission medications   Not on File   BP 130/71 mmHg  Pulse 67  Temp(Src) 97.7 F (36.5 C) (Oral)  Resp 12  Ht 5\' 9"  (1.753 m)  Wt 205 lb (92.987 kg)  BMI 30.26 kg/m2  SpO2 98% Physical Exam  Constitutional: He is oriented to person, place, and time. He appears well-developed and well-nourished. No distress.  HENT:  Head: Normocephalic and atraumatic.  Eyes: Conjunctivae are normal.  Neck: Neck supple.  Cardiovascular: Normal rate, regular rhythm and normal heart sounds.   Pulmonary/Chest: Effort normal. No respiratory distress. He has no wheezes. He has no rales. He exhibits tenderness.  Diffuse tenderness to palpation  Abdominal: Soft. Bowel sounds are normal. He exhibits no distension. There is no tenderness. There is no rebound.  Diffuse tenderness  Musculoskeletal: He exhibits no edema.  Neurological: He is alert and oriented to person, place, and time.  Skin: Skin is warm  and dry.  Nursing note and vitals reviewed.   ED Course  Procedures (including critical care time) Labs Review Labs Reviewed  CBC WITH DIFFERENTIAL/PLATELET - Abnormal; Notable for the following:    WBC 3.0 (*)    Neutro Abs 1.5 (*)    All other components within normal limits  COMPREHENSIVE METABOLIC PANEL -  Abnormal; Notable for the following:    Potassium 3.2 (*)    Glucose, Bld 143 (*)    Calcium 8.7 (*)    Total Protein 6.4 (*)    ALT 13 (*)    All other components within normal limits  URINALYSIS, ROUTINE W REFLEX MICROSCOPIC (NOT AT [facility]) - Abnormal; Notable for the following:    Bilirubin Urine SMALL (*)    All other components within normal limits  BRAIN NATRIURETIC PEPTIDE  LIPASE, BLOOD  I-STAT TROPOININ, ED    Imaging Review No results found.   EKG Interpretation   Date/Time:  Saturday December 19 2014 08:43:32 EDT Ventricular Rate:  65 PR Interval:  136 QRS Duration: 96 QT Interval:  421 QTC Calculation: 438 R Axis:   82 Text Interpretation:  Sinus rhythm Confirmed by ZACKOWSKI  MD, SCOTT  ([id number]) on 12/19/2014 8:46:02 AM      MDM   Final diagnoses:  Generalized abdominal pain    patient with abdominal pain that radiates to the back, chest pain, all started 5 days ago and have been constant since then. He is on toxic appearing on exam, and in no acute distress. Abdomen is soft, diffusely tender. Also tenderness to palpation over his chest wall. We'll get labs, chest x-ray, will try GI cocktail and Bentyl.  12:19 PM CT negative. Labs unremarkable other than potassium of 3.2, given po. Prot 6.4. Ca 8.7, WBC 3. Will dc home with pepcid, bentyl, follow up with pcp. Pt in NAD. Abdomen soft. VS normal.  Stable for dc home. Return precautions discussed  Filed Vitals:   12/19/14 1114 12/19/14 1130 12/19/14 1200 12/19/14 1227  BP: 127/82 105/64 118/76 127/75  Pulse: 91 47 48 51  Temp:    97.7 F (36.5 C)  TempSrc:    Oral  Resp:    16  Height:      Weight:      SpO2: 98% 99% 97% 98%     Steve Crumble, PA-C 12/19/14 1312  Steve Mulders, MD 12/19/14 1645

## 2014-12-19 NOTE — ED Notes (Signed)
Pt c/o lower abdominal cramping, chest pain and sob starting several days ago.  Denies changes in bowel or bladder habits.  Denies nausea.

## 2015-01-02 ENCOUNTER — Emergency Department (HOSPITAL_COMMUNITY)
Admission: EM | Admit: 2015-01-02 | Discharge: 2015-01-02 | Disposition: A | Discharge status: Home / Self Care | Payer: Self-pay | Attending: Emergency Medicine | Admitting: Emergency Medicine

## 2015-01-02 ENCOUNTER — Encounter (HOSPITAL_COMMUNITY): Payer: Self-pay | Admitting: Emergency Medicine

## 2015-01-02 DIAGNOSIS — Z202 Contact with and (suspected) exposure to infections with a predominantly sexual mode of transmission: Secondary | ICD-10-CM | POA: Insufficient documentation

## 2015-01-02 DIAGNOSIS — R1110 Vomiting, unspecified: Secondary | ICD-10-CM

## 2015-01-02 DIAGNOSIS — Z79899 Other long term (current) drug therapy: Secondary | ICD-10-CM | POA: Insufficient documentation

## 2015-01-02 DIAGNOSIS — Z711 Person with feared health complaint in whom no diagnosis is made: Secondary | ICD-10-CM

## 2015-01-02 DIAGNOSIS — G8929 Other chronic pain: Secondary | ICD-10-CM | POA: Insufficient documentation

## 2015-01-02 DIAGNOSIS — N4889 Other specified disorders of penis: Secondary | ICD-10-CM | POA: Insufficient documentation

## 2015-01-02 DIAGNOSIS — Z720 Tobacco use: Secondary | ICD-10-CM | POA: Insufficient documentation

## 2015-01-02 DIAGNOSIS — R197 Diarrhea, unspecified: Secondary | ICD-10-CM | POA: Insufficient documentation

## 2015-01-02 LAB — COMPREHENSIVE METABOLIC PANEL
ALT: 13 U/L — ABNORMAL LOW (ref 17–63)
AST: 16 U/L (ref 15–41)
Albumin: 3.6 g/dL (ref 3.5–5.0)
Alkaline Phosphatase: 60 U/L (ref 38–126)
Anion gap: 7 (ref 5–15)
BUN: 8 mg/dL (ref 6–20)
CO2: 24 mmol/L (ref 22–32)
Calcium: 9.2 mg/dL (ref 8.9–10.3)
Chloride: 110 mmol/L (ref 101–111)
Creatinine, Ser: 0.95 mg/dL (ref 0.61–1.24)
GFR calc Af Amer: >60 mL/min (ref >60)
GFR calc non Af Amer: >60 mL/min (ref >60)
Glucose, Bld: 96 mg/dL (ref 65–99)
Potassium: 4 mmol/L (ref 3.5–5.1)
Sodium: 141 mmol/L (ref 135–145)
Total Bilirubin: 0.8 mg/dL (ref 0.3–1.2)
Total Protein: 6.6 g/dL (ref 6.5–8.1)

## 2015-01-02 LAB — CBC
HCT: 41.2 % (ref 39.0–52.0)
Hemoglobin: 14.6 g/dL (ref 13.0–17.0)
MCH: 31.3 pg (ref 26.0–34.0)
MCHC: 35.4 g/dL (ref 30.0–36.0)
MCV: 88.4 fL (ref 78.0–100.0)
Platelets: 140 10*3/uL — ABNORMAL LOW (ref 150–400)
RBC: 4.66 MIL/uL (ref 4.22–5.81)
RDW: 13.7 % (ref 11.5–15.5)
WBC: 3.4 10*3/uL — ABNORMAL LOW (ref 4.0–10.5)

## 2015-01-02 LAB — URINALYSIS, ROUTINE W REFLEX MICROSCOPIC
Bilirubin Urine: NEGATIVE
Glucose, UA: NEGATIVE mg/dL
Hgb urine dipstick: NEGATIVE
Ketones, ur: NEGATIVE mg/dL
Leukocytes, UA: NEGATIVE
Nitrite: NEGATIVE
Protein, ur: NEGATIVE mg/dL
Specific Gravity, Urine: 1.014 (ref 1.005–1.030)
Urobilinogen, UA: 1 mg/dL (ref 0.0–1.0)
pH: 6.5 (ref 5.0–8.0)

## 2015-01-02 LAB — LIPASE, BLOOD: Lipase: 32 U/L (ref 22–51)

## 2015-01-02 MED ORDER — AZITHROMYCIN 250 MG PO TABS
1000.0000 mg | ORAL_TABLET | Freq: Once | ORAL | Status: AC
  Administered 2015-01-02: 1000 mg via ORAL
  Filled 2015-01-02: qty 4

## 2015-01-02 MED ORDER — KETOROLAC TROMETHAMINE 30 MG/ML IJ SOLN
30.0000 mg | Freq: Once | INTRAMUSCULAR | Status: AC
  Administered 2015-01-02: 30 mg via INTRAVENOUS
  Filled 2015-01-02: qty 1

## 2015-01-02 MED ORDER — DEXTROSE 5 % IV SOLN: 250.0000 mg | INTRAVENOUS | Status: DC

## 2015-01-02 MED ORDER — ONDANSETRON HCL 4 MG PO TABS: 4.0000 mg | ORAL_TABLET | Freq: Four times a day (QID) | ORAL | Status: DC

## 2015-01-02 MED ORDER — CEFTRIAXONE SODIUM 250 MG IJ SOLR
250.0000 mg | INTRAMUSCULAR | Status: DC
  Administered 2015-01-02: 250 mg via INTRAMUSCULAR
  Filled 2015-01-02: qty 250

## 2015-01-02 MED ORDER — ONDANSETRON HCL 4 MG/2ML IJ SOLN
4.0000 mg | Freq: Once | INTRAMUSCULAR | Status: AC
  Administered 2015-01-02: 4 mg via INTRAVENOUS
  Filled 2015-01-02: qty 2

## 2015-01-02 MED ORDER — SODIUM CHLORIDE 0.9 % IV BOLUS (SEPSIS)
1000.0000 mL | Freq: Once | INTRAVENOUS | Status: AC
  Administered 2015-01-02: 1000 mL via INTRAVENOUS

## 2015-01-02 NOTE — Discharge Instructions (Signed)
Take Zofran as directed as needed for nausea. You were treated today for both gonorrhea and chlamydia. If these tests result positive, you will be contacted and are then obligated to inform your partner for treatment.  Diarrhea Diarrhea is frequent loose and watery bowel movements. It can cause you to feel weak and dehydrated. Dehydration can cause you to become tired and thirsty, have a dry mouth, and have decreased urination that often is dark yellow. Diarrhea is a sign of another problem, most often an infection that will not last long. In most cases, diarrhea typically lasts 2-3 days. However, it can last longer if it is a sign of something more serious. It is important to treat your diarrhea as directed by your caregiver to lessen or prevent future episodes of diarrhea. CAUSES  Some common causes include:  Gastrointestinal infections caused by viruses, bacteria, or parasites.  Food poisoning or food allergies.  Certain medicines, such as antibiotics, chemotherapy, and laxatives.  Artificial sweeteners and fructose.  Digestive disorders. HOME CARE INSTRUCTIONS  Ensure adequate fluid intake (hydration): Have 1 cup (8 oz) of fluid for each diarrhea episode. Avoid fluids that contain simple sugars or sports drinks, fruit juices, whole milk products, and sodas. Your urine should be clear or pale yellow if you are drinking enough fluids. Hydrate with an oral rehydration solution that you can purchase at pharmacies, retail stores, and online. You can prepare an oral rehydration solution at home by mixing the following ingredients together:   - tsp table salt.   tsp baking soda.   tsp salt substitute containing potassium chloride.  1  tablespoons sugar.  1 L (34 oz) of water.  Certain foods and beverages may increase the speed at which food moves through the gastrointestinal (GI) tract. These foods and beverages should be avoided and include:  Caffeinated and alcoholic  beverages.  High-fiber foods, such as raw fruits and vegetables, nuts, seeds, and whole grain breads and cereals.  Foods and beverages sweetened with sugar alcohols, such as xylitol, sorbitol, and mannitol.  Some foods may be well tolerated and may help thicken stool including:  Starchy foods, such as rice, toast, pasta, low-sugar cereal, oatmeal, grits, baked potatoes, crackers, and bagels.  Bananas.  Applesauce.  Add probiotic-rich foods to help increase healthy bacteria in the GI tract, such as yogurt and fermented milk products.  Wash your hands well after each diarrhea episode.  Only take over-the-counter or prescription medicines as directed by your caregiver.  Take a warm bath to relieve any burning or pain from frequent diarrhea episodes. SEEK IMMEDIATE MEDICAL CARE IF:   You are unable to keep fluids down.  You have persistent vomiting.  You have blood in your stool, or your stools are black and tarry.  You do not urinate in 6-8 hours, or there is only a small amount of very dark urine.  You have abdominal pain that increases or localizes.  You have weakness, dizziness, confusion, or light-headedness.  You have a severe headache.  Your diarrhea gets worse or does not get better.  You have a fever or persistent symptoms for more than 2-3 days.  You have a fever and your symptoms suddenly get worse. MAKE SURE YOU:   Understand these instructions.  Will watch your condition.  Will get help right away if you are not doing well or get worse. Document Released: 05/26/2002 Document Revised: 10/20/2013 Document Reviewed: 02/11/2012 [facility] Patient Information 2015 [place], [place]. This information is not intended to replace advice given  to you by your health care provider. Make sure you discuss any questions you have with your health care provider.  Nausea and Vomiting Nausea is a sick feeling that often comes before throwing up (vomiting). Vomiting is a reflex  where stomach contents come out of your mouth. Vomiting can cause severe loss of body fluids (dehydration). Children and elderly adults can become dehydrated quickly, especially if they also have diarrhea. Nausea and vomiting are symptoms of a condition or disease. It is important to find the cause of your symptoms. CAUSES   Direct irritation of the stomach lining. This irritation can result from increased acid production (gastroesophageal reflux disease), infection, food poisoning, taking certain medicines (such as nonsteroidal anti-inflammatory drugs), alcohol use, or tobacco use.  Signals from the brain.These signals could be caused by a headache, heat exposure, an inner ear disturbance, increased pressure in the brain from injury, infection, a tumor, or a concussion, pain, emotional stimulus, or metabolic problems.  An obstruction in the gastrointestinal tract (bowel obstruction).  Illnesses such as diabetes, hepatitis, gallbladder problems, appendicitis, kidney problems, cancer, sepsis, atypical symptoms of a heart attack, or eating disorders.  Medical treatments such as chemotherapy and radiation.  Receiving medicine that makes you sleep (general anesthetic) during surgery. DIAGNOSIS Your caregiver may ask for tests to be done if the problems do not improve after a few days. Tests may also be done if symptoms are severe or if the reason for the nausea and vomiting is not clear. Tests may include:  Urine tests.  Blood tests.  Stool tests.  Cultures (to look for evidence of infection).  X-rays or other imaging studies. Test results can help your caregiver make decisions about treatment or the need for additional tests. TREATMENT You need to stay well hydrated. Drink frequently but in small amounts.You may wish to drink water, sports drinks, clear broth, or eat frozen ice pops or gelatin dessert to help stay hydrated.When you eat, eating slowly may help prevent nausea.There are  also some antinausea medicines that may help prevent nausea. HOME CARE INSTRUCTIONS   Take all medicine as directed by your caregiver.  If you do not have an appetite, do not force yourself to eat. However, you must continue to drink fluids.  If you have an appetite, eat a normal diet unless your caregiver tells you differently.  Eat a variety of complex carbohydrates (rice, wheat, potatoes, bread), lean meats, yogurt, fruits, and vegetables.  Avoid high-fat foods because they are more difficult to digest.  Drink enough water and fluids to keep your urine clear or pale yellow.  If you are dehydrated, ask your caregiver for specific rehydration instructions. Signs of dehydration may include:  Severe thirst.  Dry lips and mouth.  Dizziness.  Dark urine.  Decreasing urine frequency and amount.  Confusion.  Rapid breathing or pulse. SEEK IMMEDIATE MEDICAL CARE IF:   You have blood or brown flecks (like coffee grounds) in your vomit.  You have black or bloody stools.  You have a severe headache or stiff neck.  You are confused.  You have severe abdominal pain.  You have chest pain or trouble breathing.  You do not urinate at least once every 8 hours.  You develop cold or clammy skin.  You continue to vomit for longer than 24 to 48 hours.  You have a fever. MAKE SURE YOU:   Understand these instructions.  Will watch your condition.  Will get help right away if you are not doing well or  get worse. Document Released: 06/05/2005 Document Revised: 08/28/2011 Document Reviewed: 11/02/2010 [facility] Patient Information 2015 [place], [place]. This information is not intended to replace advice given to you by your health care provider. Make sure you discuss any questions you have with your health care provider. Sexually Transmitted Disease A sexually transmitted disease (STD) is a disease or infection that may be passed (transmitted) from person to person, usually during  sexual activity. This may happen by way of saliva, semen, blood, vaginal mucus, or urine. Common STDs include:   Gonorrhea.   Chlamydia.   Syphilis.   HIV and AIDS.   Genital herpes.   Hepatitis B and C.   Trichomonas.   Human papillomavirus (HPV).   Pubic lice.   Scabies.  Mites.  Bacterial vaginosis. WHAT ARE CAUSES OF STDs? An STD may be caused by bacteria, a virus, or parasites. STDs are often transmitted during sexual activity if one person is infected. However, they may also be transmitted through nonsexual means. STDs may be transmitted after:   Sexual intercourse with an infected person.   Sharing sex toys with an infected person.   Sharing needles with an infected person or using unclean piercing or tattoo needles.  Having intimate contact with the genitals, mouth, or rectal areas of an infected person.   Exposure to infected fluids during birth. WHAT ARE THE SIGNS AND SYMPTOMS OF STDs? Different STDs have different symptoms. Some people may not have any symptoms. If symptoms are present, they may include:   Painful or bloody urination.   Pain in the pelvis, abdomen, vagina, anus, throat, or eyes.   A skin rash, itching, or irritation.  Growths, ulcerations, blisters, or sores in the genital and anal areas.  Abnormal vaginal discharge with or without bad odor.   Penile discharge in men.   Fever.   Pain or bleeding during sexual intercourse.   Swollen glands in the groin area.   Yellow skin and eyes (jaundice). This is seen with hepatitis.   Swollen testicles.  Infertility.  Sores and blisters in the mouth. HOW ARE STDs DIAGNOSED? To make a diagnosis, your health care provider may:   Take a medical history.   Perform a physical exam.   Take a sample of any discharge to examine.  Swab the throat, cervix, opening to the penis, rectum, or vagina for testing.  Test a sample of your first morning urine.   Perform  blood tests.   Perform a Pap test, if this applies.   Perform a colposcopy.   Perform a laparoscopy.  HOW ARE STDs TREATED? Treatment depends on the STD. Some STDs may be treated but not cured.   Chlamydia, gonorrhea, trichomonas, and syphilis can be cured with antibiotic medicine.   Genital herpes, hepatitis, and HIV can be treated, but not cured, with prescribed medicines. The medicines lessen symptoms.   Genital warts from HPV can be treated with medicine or by freezing, burning (electrocautery), or surgery. Warts may come back.   HPV cannot be cured with medicine or surgery. However, abnormal areas may be removed from the cervix, vagina, or vulva.   If your diagnosis is confirmed, your recent sexual partners need treatment. This is true even if they are symptom-free or have a negative culture or evaluation. They should not have sex until their health care providers say it is okay. HOW CAN I REDUCE MY RISK OF GETTING AN STD? Take these steps to reduce your risk of getting an STD:  Use latex condoms, dental  dams, and water-soluble lubricants during sexual activity. Do not use petroleum jelly or oils.  Avoid having multiple sex partners.  Do not have sex with someone who has other sex partners.  Do not have sex with anyone you do not know or who is at high risk for an STD.  Avoid risky sex practices that can break your skin.  Do not have sex if you have open sores on your mouth or skin.  Avoid drinking too much alcohol or taking illegal drugs. Alcohol and drugs can affect your judgment and put you in a vulnerable position.  Avoid engaging in oral and anal sex acts.  Get vaccinated for HPV and hepatitis. If you have not received these vaccines in the past, talk to your health care provider about whether one or both might be right for you.   If you are at risk of being infected with HIV, it is recommended that you take a prescription medicine daily to prevent HIV  infection. This is called pre-exposure prophylaxis (PrEP). You are considered at risk if:  You are a man who has sex with other men (MSM).  You are a heterosexual man or woman and are sexually active with more than one partner.  You take drugs by injection.  You are sexually active with a partner who has HIV.  Talk with your health care provider about whether you are at high risk of being infected with HIV. If you choose to begin PrEP, you should first be tested for HIV. You should then be tested every 3 months for as long as you are taking PrEP.  WHAT SHOULD I DO IF I THINK I HAVE AN STD?  See your health care provider.   Tell your sexual partner(s). They should be tested and treated for any STDs.  Do not have sex until your health care provider says it is okay. WHEN SHOULD I GET IMMEDIATE MEDICAL CARE? Contact your health care provider right away if:   You have severe abdominal pain.  You are a man and notice swelling or pain in your testicles.  You are a woman and notice swelling or pain in your vagina. Document Released: 08/26/2002 Document Revised: 06/10/2013 Document Reviewed: 12/24/2012 [facility] Patient Information 2015 [place], [place]. This information is not intended to replace advice given to you by your health care provider. Make sure you discuss any questions you have with your health care provider.

## 2015-01-02 NOTE — ED Provider Notes (Signed)
CSN: [id number]     Arrival date & time 01/02/15  1218 History   First MD Initiated Contact with Patient 01/02/15 1222     Chief Complaint  Patient presents with  . Abdominal Pain  . Dysuria     (Consider location/radiation/quality/duration/timing/severity/associated sxs/prior Treatment) HPI Comments: 54 year old male presenting with abdominal pain, nausea, vomiting, diarrhea and concern for sexually transmitted disease. Reports upper abdominal cramping 4 days with associated 4 episodes of nonbloody diarrhea and 4 episodes of nonbloody, nonbilious emesis. Pain located mostly in her epigastric area, radiating across the top of her abdomen, no radiation to the back. He had [employer] prior to coming to the emergency department and states it made him feel nauseous. Admits to drinking "some" alcohol daily. Denies fevers. Admits to increased urinary frequency, urgency and dysuria. States he has pain in his penis when he urinates. Had sexual intercourse 3 days ago with a new partner is concerned that he has a sexually transmitted disease. No penile discharge, testicular pain or swelling. History of gonorrhea in the past.  The history is provided by the patient.    Past Medical History  Diagnosis Date  . Chronic pain    Past Surgical History  Procedure Laterality Date  . Orthopedic surgery     History reviewed. No pertinent family history. History  Substance Use Topics  . Smoking status: Current Every Day Smoker -- 0.50 packs/day    Types: Cigarettes  . Smokeless tobacco: Not on file  . Alcohol Use: 16.8 oz/week    28 Cans of beer per week     Comment: daily    Review of Systems  Gastrointestinal: Positive for nausea, vomiting, abdominal pain and diarrhea.  Genitourinary: Positive for dysuria, urgency, frequency and penile pain.  All other systems reviewed and are negative.     Allergies  Review of patient's allergies indicates no known allergies.  Home Medications   Prior  to Admission medications   Medication Sig Start Date End Date Taking? Authorizing Provider  dicyclomine (BENTYL) 20 MG tablet Take 1 tablet (20 mg total) by mouth 2 (two) times daily. 12/19/14   Tatyana Kirichenko, PA-C  famotidine (PEPCID) 20 MG tablet Take 1 tablet (20 mg total) by mouth 2 (two) times daily. 12/19/14   Tatyana Kirichenko, PA-C  ondansetron (ZOFRAN) 4 MG tablet Take 1 tablet (4 mg total) by mouth every 6 (six) hours. 01/02/15   Reshma Hoey M Elkin Belfield, PA-C   BP 128/79 mmHg  Pulse 50  Temp(Src) 97.6 F (36.4 C) (Oral)  Resp 20  SpO2 98% Physical Exam  Constitutional: He is oriented to person, place, and time. He appears well-developed and well-nourished. No distress.  HENT:  Head: Normocephalic and atraumatic.  Eyes: Conjunctivae and EOM are normal.  Neck: Normal range of motion. Neck supple.  Cardiovascular: Normal rate, regular rhythm and normal heart sounds.   Pulmonary/Chest: Effort normal and breath sounds normal.  Abdominal: Normal appearance. He exhibits no distension. There is tenderness in the epigastric area. There is no rigidity, no rebound, no guarding and no CVA tenderness.  No peritoneal signs.  Genitourinary: Right testis shows no mass, no swelling and no tenderness. Left testis shows no mass, no swelling and no tenderness. Uncircumcised. No penile erythema or penile tenderness. No discharge found.  Chaperoned exam.  Musculoskeletal: Normal range of motion. He exhibits no edema.  Neurological: He is alert and oriented to person, place, and time.  Skin: Skin is warm and dry.  Psychiatric: He has a normal mood and  affect. His behavior is normal.  Nursing note and vitals reviewed.   ED Course  Procedures (including critical care time) Labs Review Labs Reviewed  CBC - Abnormal; Notable for the following:    WBC 3.4 (*)    Platelets 140 (*)    All other components within normal limits  COMPREHENSIVE METABOLIC PANEL - Abnormal; Notable for the following:    ALT 13  (*)    All other components within normal limits  LIPASE, BLOOD  URINALYSIS, ROUTINE W REFLEX MICROSCOPIC (NOT [facility])  HIV ANTIBODY (ROUTINE TESTING)  GC/CHLAMYDIA PROBE AMP ([place]) NOT AT [facility]    Imaging Review No results found.   EKG Interpretation None      MDM   Final diagnoses:  Concern about STD in male without diagnosis  Vomiting and diarrhea   Non-toxic appearing, NAD. AFVSS. Abdomen is soft with no peritoneal signs. Labs without acute finding. No vomiting in ED. Tolerating PO. Received toradol and zofran with significant relief of abdominal pain. Repeat exam benign. Doubt intraabdominal infection, obstruction. Probable viral illness given both vomiting and diarrhea. Regarding concern for STD, cultures pending. Rocephin and azithro given. Safe sexual practices discussed. Stable for d/c. Return precautions given. Patient states understanding of treatment care plan and is agreeable.  Kathrynn SpeedRobyn M Jimmylee Ratterree, PA-C 01/02/15 1448  Zadie Rhineonald Wickline, MD 01/02/15 [phone]

## 2015-01-02 NOTE — ED Notes (Signed)
Pt reports that he had unprotected intercourse last week and has had dysuria and abd pain since then. Endorses n/v as well. Denies penile dc.

## 2015-01-02 NOTE — ED Notes (Signed)
IV removed, discharge vitals completed 

## 2015-01-03 LAB — HIV ANTIBODY (ROUTINE TESTING W REFLEX): HIV Screen 4th Generation wRfx: NONREACTIVE

## 2015-01-04 LAB — GC/CHLAMYDIA PROBE AMP (~~LOC~~) NOT AT ARMC
Chlamydia: NEGATIVE
Neisseria Gonorrhea: NEGATIVE

## 2015-02-16 ENCOUNTER — Encounter (HOSPITAL_COMMUNITY): Payer: Self-pay

## 2015-02-16 ENCOUNTER — Emergency Department (HOSPITAL_COMMUNITY)
Admission: EM | Admit: 2015-02-16 | Discharge: 2015-02-16 | Disposition: A | Discharge status: Home / Self Care | Payer: Self-pay | Attending: Emergency Medicine | Admitting: Emergency Medicine

## 2015-02-16 DIAGNOSIS — Z202 Contact with and (suspected) exposure to infections with a predominantly sexual mode of transmission: Secondary | ICD-10-CM | POA: Insufficient documentation

## 2015-02-16 DIAGNOSIS — R1013 Epigastric pain: Secondary | ICD-10-CM | POA: Insufficient documentation

## 2015-02-16 DIAGNOSIS — G8929 Other chronic pain: Secondary | ICD-10-CM | POA: Insufficient documentation

## 2015-02-16 DIAGNOSIS — Z720 Tobacco use: Secondary | ICD-10-CM | POA: Insufficient documentation

## 2015-02-16 LAB — COMPREHENSIVE METABOLIC PANEL
ALT: 11 U/L — ABNORMAL LOW (ref 17–63)
AST: 16 U/L (ref 15–41)
Albumin: 3.3 g/dL — ABNORMAL LOW (ref 3.5–5.0)
Alkaline Phosphatase: 48 U/L (ref 38–126)
Anion gap: 4 — ABNORMAL LOW (ref 5–15)
BUN: 10 mg/dL (ref 6–20)
CO2: 26 mmol/L (ref 22–32)
Calcium: 8.8 mg/dL — ABNORMAL LOW (ref 8.9–10.3)
Chloride: 110 mmol/L (ref 101–111)
Creatinine, Ser: 0.99 mg/dL (ref 0.61–1.24)
GFR calc Af Amer: >60 mL/min (ref >60)
GFR calc non Af Amer: >60 mL/min (ref >60)
Glucose, Bld: 108 mg/dL — ABNORMAL HIGH (ref 65–99)
Potassium: 3.9 mmol/L (ref 3.5–5.1)
Sodium: 140 mmol/L (ref 135–145)
Total Bilirubin: 0.4 mg/dL (ref 0.3–1.2)
Total Protein: 6 g/dL — ABNORMAL LOW (ref 6.5–8.1)

## 2015-02-16 LAB — URINALYSIS, ROUTINE W REFLEX MICROSCOPIC
Bilirubin Urine: NEGATIVE
Glucose, UA: NEGATIVE mg/dL
Hgb urine dipstick: NEGATIVE
Ketones, ur: NEGATIVE mg/dL
Leukocytes, UA: NEGATIVE
Nitrite: NEGATIVE
Protein, ur: NEGATIVE mg/dL
Specific Gravity, Urine: >1.03 — ABNORMAL HIGH (ref 1.005–1.030)
Urobilinogen, UA: 0.2 mg/dL (ref 0.0–1.0)
pH: 5.5 (ref 5.0–8.0)

## 2015-02-16 LAB — CBC WITH DIFFERENTIAL/PLATELET
Basophils Absolute: 0 10*3/uL (ref 0.0–0.1)
Basophils Relative: 1 % (ref 0–1)
Eosinophils Absolute: 0.1 10*3/uL (ref 0.0–0.7)
Eosinophils Relative: 2 % (ref 0–5)
HCT: 39.5 % (ref 39.0–52.0)
Hemoglobin: 13.5 g/dL (ref 13.0–17.0)
Lymphocytes Relative: 48 % — ABNORMAL HIGH (ref 12–46)
Lymphs Abs: 1.5 10*3/uL (ref 0.7–4.0)
MCH: 30.5 pg (ref 26.0–34.0)
MCHC: 34.2 g/dL (ref 30.0–36.0)
MCV: 89.4 fL (ref 78.0–100.0)
Monocytes Absolute: 0.2 10*3/uL (ref 0.1–1.0)
Monocytes Relative: 8 % (ref 3–12)
Neutro Abs: 1.3 10*3/uL — ABNORMAL LOW (ref 1.7–7.7)
Neutrophils Relative %: 41 % — ABNORMAL LOW (ref 43–77)
Platelets: 126 10*3/uL — ABNORMAL LOW (ref 150–400)
RBC: 4.42 MIL/uL (ref 4.22–5.81)
RDW: 13.3 % (ref 11.5–15.5)
WBC: 3.1 10*3/uL — ABNORMAL LOW (ref 4.0–10.5)

## 2015-02-16 LAB — RPR: RPR Ser Ql: NONREACTIVE

## 2015-02-16 LAB — LIPASE, BLOOD: Lipase: 30 U/L (ref 22–51)

## 2015-02-16 LAB — HIV ANTIBODY (ROUTINE TESTING W REFLEX): HIV Screen 4th Generation wRfx: NONREACTIVE

## 2015-02-16 MED ORDER — AZITHROMYCIN 250 MG PO TABS
1000.0000 mg | ORAL_TABLET | Freq: Once | ORAL | Status: AC
  Administered 2015-02-16: 1000 mg via ORAL
  Filled 2015-02-16: qty 4

## 2015-02-16 MED ORDER — CEFTRIAXONE SODIUM 250 MG IJ SOLR
250.0000 mg | Freq: Once | INTRAMUSCULAR | Status: AC
  Administered 2015-02-16: 250 mg via INTRAMUSCULAR
  Filled 2015-02-16: qty 250

## 2015-02-16 MED ORDER — LIDOCAINE HCL (PF) 1 % IJ SOLN
INTRAMUSCULAR | Status: AC
  Administered 2015-02-16: 0.9 mL
  Filled 2015-02-16: qty 5

## 2015-02-16 NOTE — ED Provider Notes (Signed)
CSN: [id number]     Arrival date & time 02/16/15  0718 History   First MD Initiated Contact with Patient 02/16/15 [phone]     Chief Complaint  Patient presents with  . Abdominal Pain     (Consider location/radiation/quality/duration/timing/severity/associated sxs/prior Treatment) HPI IMRI LOR is a 54 y.o. male who comes in for evaluation of abdominal discomfort, dysuria and possible STD exposure. Patient states 2 nights ago he had unprotected sex with a "friend of mine". Since that time he reports associated dysuria. Denies any penile pain, sores, discharge. He also reports associated abdominal discomfort. Patient admits to drinking alcohol regularly, most recently a 6 pack last night and complains of mild epigastric discomfort now. No fevers, chills, nausea or vomiting, no diarrhea or constipation, no bloody or dark stools, no hematuria, flank or back pain.  Past Medical History  Diagnosis Date  . Chronic pain    Past Surgical History  Procedure Laterality Date  . Orthopedic surgery     No family history on file. Social History  Substance Use Topics  . Smoking status: Current Every Day Smoker -- 0.50 packs/day    Types: Cigarettes  . Smokeless tobacco: None  . Alcohol Use: 16.8 oz/week    28 Cans of beer per week     Comment: daily    Review of Systems A 10 point review of systems was completed and was negative except for pertinent positives and negatives as mentioned in the history of present illness     Allergies  Review of patient's allergies indicates no known allergies.  Home Medications   Prior to Admission medications   Medication Sig Start Date End Date Taking? Authorizing Provider  acetaminophen (TYLENOL) 325 MG tablet Take 650 mg by mouth every 6 (six) hours as needed for mild pain.   Yes Historical Provider, MD   BP 123/68 mmHg  Pulse 55  Temp(Src) 98 F (36.7 C) (Oral)  Resp 18  SpO2 100% Physical Exam  Constitutional: He is oriented to person,  place, and time. He appears well-developed and well-nourished.  HENT:  Head: Normocephalic and atraumatic.  Mouth/Throat: Oropharynx is clear and moist.  Eyes: Conjunctivae are normal. Pupils are equal, round, and reactive to light. Right eye exhibits no discharge. Left eye exhibits no discharge. No scleral icterus.  Neck: Neck supple.  Cardiovascular: Normal rate, regular rhythm and normal heart sounds.   Pulmonary/Chest: Effort normal and breath sounds normal. No respiratory distress. He has no wheezes. He has no rales.  Abdominal: Soft. There is no tenderness.  Genitourinary:  Chaperone present for GU exam. No obvious lesions, sores or deformities noted. No erythema or swelling. Normal GU exam  Musculoskeletal: He exhibits no tenderness.  Neurological: He is alert and oriented to person, place, and time.  Cranial Nerves II-XII grossly intact  Skin: Skin is warm and dry. No rash noted.  Psychiatric: He has a normal mood and affect.  Nursing note and vitals reviewed.   ED Course  Procedures (including critical care time) Labs Review Labs Reviewed  URINALYSIS, ROUTINE W REFLEX MICROSCOPIC (NOT AT [facility]) - Abnormal; Notable for the following:    Specific Gravity, Urine >1.030 (*)    All other components within normal limits  COMPREHENSIVE METABOLIC PANEL - Abnormal; Notable for the following:    Glucose, Bld 108 (*)    Calcium 8.8 (*)    Total Protein 6.0 (*)    Albumin 3.3 (*)    ALT 11 (*)    Anion gap 4 (*)  All other components within normal limits  CBC WITH DIFFERENTIAL/PLATELET - Abnormal; Notable for the following:    WBC 3.1 (*)    Platelets 126 (*)    Neutrophils Relative % 41 (*)    Neutro Abs 1.3 (*)    Lymphocytes Relative 48 (*)    All other components within normal limits  HIV ANTIBODY (ROUTINE TESTING)  RPR  LIPASE, BLOOD  GC/CHLAMYDIA PROBE AMP ([place]) NOT AT [facility]    Imaging Review No results found. I have personally reviewed and evaluated these  images and lab results as part of my medical decision-making.   EKG Interpretation None     Meds given in ED:  Medications  cefTRIAXone (ROCEPHIN) injection 250 mg (250 mg Intramuscular Given 02/16/15 0850)  azithromycin (ZITHROMAX) tablet 1,000 mg (1,000 mg Oral Given 02/16/15 0849)  lidocaine (PF) (XYLOCAINE) 1 % injection (0.9 mLs  Given 02/16/15 0852)    Discharge Medication List as of 02/16/2015 10:26 AM     Filed Vitals:   02/16/15 0846 02/16/15 0915 02/16/15 1016 02/16/15 1022  BP: 103/68 116/75 123/68 123/68  Pulse: 51 61 50 55  Temp:      TempSrc:      Resp: 18  18 18   SpO2: 99% 100% 100% 100%    MDM  Vitals stable - WNL -afebrile Pt resting comfortably in ED. PE--physical exam as above, not concerning. Labwork-No evidence of UTI labs otherwise noncontributory. Pending HIV and RPR. Treated empirically for STI exposure with ceftriaxone and 1 g azithromycin. Recommend safe sexual practices and follow-up with health department for further evaluation and management of symptoms.  I discussed all relevant lab findings and imaging results with pt and they verbalized understanding. Discussed f/u with PCP within 48 hrs and return precautions, pt very amenable to plan.]  Final diagnoses:  Possible exposure to STD  Epigastric pain       Joycie Peek, PA-C 02/17/15 1217  Rolan Bucco, MD 02/20/15 [phone]

## 2015-02-16 NOTE — ED Notes (Addendum)
Pt. Presents with complaint of lower abd pain and cramping x 3 days. Pt. States he has been using the bathroom more regularly. Pt. Denies N/V. Pt. Concern over possible STD. Pt. States he has burning and pain with urination.  Pt. Ambulatory on arrival.

## 2015-02-16 NOTE — Discharge Instructions (Signed)
There does not appear to be an emergent cause her symptoms at this time. Your exam, labs are all reassuring. He was treated in the ED for STD exposure. Please follow-up with the health department for further evaluation and management of these symptoms. Follow-up with your primary care as needed. Return to ED for new or worsening symptoms.  Safe Sex Safe sex is about reducing the risk of giving or getting a sexually transmitted disease (STD). STDs are spread through sexual contact involving the genitals, mouth, or rectum. Some STDs can be cured and others cannot. Safe sex can also prevent unintended pregnancies.  WHAT ARE SOME SAFE SEX PRACTICES?  Limit your sexual activity to only one partner who is having sex with only you.  Talk to your partner about his or her past partners, past STDs, and drug use.  Use a condom every time you have sexual intercourse. This includes vaginal, oral, and anal sexual activity. Both females and males should wear condoms during oral sex. Only use latex or polyurethane condoms and water-based lubricants. Using petroleum-based lubricants or oils to lubricate a condom will weaken the condom and increase the chance that it will break. The condom should be in place from the beginning to the end of sexual activity. Wearing a condom reduces, but does not completely eliminate, your risk of getting or giving an STD. STDs can be spread by contact with infected body fluids and skin.  Get vaccinated for hepatitis B and HPV.  Avoid alcohol and recreational drugs, which can affect your judgment. You may forget to use a condom or participate in high-risk sex.  For females, avoid douching after sexual intercourse. Douching can spread an infection farther into the reproductive tract.  Check your body for signs of sores, blisters, rashes, or unusual discharge. See your health care provider if you notice any of these signs.  Avoid sexual contact if you have symptoms of an infection or  are being treated for an STD. If you or your partner has herpes, avoid sexual contact when blisters are present. Use condoms at all other times.  If you are at risk of being infected with HIV, it is recommended that you take a prescription medicine daily to prevent HIV infection. This is called pre-exposure prophylaxis (PrEP). You are considered at risk if:  You are a man who has sex with other men (MSM).  You are a heterosexual man or woman who is sexually active with more than one partner.  You take drugs by injection.  You are sexually active with a partner who has HIV.  Talk with your health care provider about whether you are at high risk of being infected with HIV. If you choose to begin PrEP, you should first be tested for HIV. You should then be tested every 3 months for as long as you are taking PrEP.  See your health care provider for regular screenings, exams, and tests for other STDs. Before having sex with a new partner, each of you should be screened for STDs and should talk about the results with each other. WHAT ARE THE BENEFITS OF SAFE SEX?   There is less chance of getting or giving an STD.  You can prevent unwanted or unintended pregnancies.  By discussing safe sex concerns with your partner, you may increase feelings of intimacy, comfort, trust, and honesty between the two of you. Document Released: 07/13/2004 Document Revised: 10/20/2013 Document Reviewed: 11/27/2011 [facility] Patient Information 2015 [place], [place]. This information is not intended to  replace advice given to you by your health care provider. Make sure you discuss any questions you have with your health care provider. ° °

## 2015-02-17 LAB — GC/CHLAMYDIA PROBE AMP (~~LOC~~) NOT AT ARMC
Chlamydia: NEGATIVE
Neisseria Gonorrhea: NEGATIVE

## 2015-02-24 ENCOUNTER — Emergency Department (HOSPITAL_COMMUNITY)
Admission: EM | Admit: 2015-02-24 | Discharge: 2015-02-25 | Disposition: A | Discharge status: Home / Self Care | Payer: Self-pay | Attending: Emergency Medicine | Admitting: Emergency Medicine

## 2015-02-24 ENCOUNTER — Encounter (HOSPITAL_COMMUNITY): Payer: Self-pay | Admitting: *Deleted

## 2015-02-24 DIAGNOSIS — R300 Dysuria: Secondary | ICD-10-CM | POA: Insufficient documentation

## 2015-02-24 DIAGNOSIS — M545 Low back pain: Secondary | ICD-10-CM | POA: Insufficient documentation

## 2015-02-24 DIAGNOSIS — G8929 Other chronic pain: Secondary | ICD-10-CM | POA: Insufficient documentation

## 2015-02-24 DIAGNOSIS — R1013 Epigastric pain: Secondary | ICD-10-CM | POA: Insufficient documentation

## 2015-02-24 DIAGNOSIS — R197 Diarrhea, unspecified: Secondary | ICD-10-CM | POA: Insufficient documentation

## 2015-02-24 DIAGNOSIS — Z720 Tobacco use: Secondary | ICD-10-CM | POA: Insufficient documentation

## 2015-02-24 LAB — CBC
HCT: 43.7 % (ref 39.0–52.0)
Hemoglobin: 15.1 g/dL (ref 13.0–17.0)
MCH: 31.3 pg (ref 26.0–34.0)
MCHC: 34.6 g/dL (ref 30.0–36.0)
MCV: 90.5 fL (ref 78.0–100.0)
Platelets: 156 10*3/uL (ref 150–400)
RBC: 4.83 MIL/uL (ref 4.22–5.81)
RDW: 13.5 % (ref 11.5–15.5)
WBC: 4.8 10*3/uL (ref 4.0–10.5)

## 2015-02-24 LAB — URINALYSIS, ROUTINE W REFLEX MICROSCOPIC
Bilirubin Urine: NEGATIVE
Glucose, UA: NEGATIVE mg/dL
Hgb urine dipstick: NEGATIVE
Ketones, ur: NEGATIVE mg/dL
Leukocytes, UA: NEGATIVE
Nitrite: NEGATIVE
Protein, ur: NEGATIVE mg/dL
Specific Gravity, Urine: 1.02 (ref 1.005–1.030)
Urobilinogen, UA: 1 mg/dL (ref 0.0–1.0)
pH: 6 (ref 5.0–8.0)

## 2015-02-24 LAB — COMPREHENSIVE METABOLIC PANEL
ALT: 14 U/L — ABNORMAL LOW (ref 17–63)
AST: 20 U/L (ref 15–41)
Albumin: 4.2 g/dL (ref 3.5–5.0)
Alkaline Phosphatase: 52 U/L (ref 38–126)
Anion gap: 9 (ref 5–15)
BUN: 11 mg/dL (ref 6–20)
CO2: 28 mmol/L (ref 22–32)
Calcium: 9.5 mg/dL (ref 8.9–10.3)
Chloride: 106 mmol/L (ref 101–111)
Creatinine, Ser: 1.05 mg/dL (ref 0.61–1.24)
GFR calc Af Amer: >60 mL/min (ref >60)
GFR calc non Af Amer: >60 mL/min (ref >60)
Glucose, Bld: 62 mg/dL — ABNORMAL LOW (ref 65–99)
Potassium: 4 mmol/L (ref 3.5–5.1)
Sodium: 143 mmol/L (ref 135–145)
Total Bilirubin: 1.1 mg/dL (ref 0.3–1.2)
Total Protein: 7.7 g/dL (ref 6.5–8.1)

## 2015-02-24 MED ORDER — GI COCKTAIL ~~LOC~~
30.0000 mL | Freq: Once | ORAL | Status: AC
  Administered 2015-02-24: 30 mL via ORAL
  Filled 2015-02-24: qty 30

## 2015-02-24 NOTE — ED Provider Notes (Signed)
CSN: [id number]     Arrival date & time 02/24/15  1934 History   First MD Initiated Contact with Patient 02/24/15 2306     Chief Complaint  Patient presents with  . Dysuria  . Abdominal Pain     (Consider location/radiation/quality/duration/timing/severity/associated sxs/prior Treatment) HPI Patient has been seen in the emergency department multiple times for abdominal pain. He's had multiple CT and abdominal scans without acute abnormality. Most recently seen about a week ago. At that time had normal labs and normal STD screening. States he is continuing to have abdominal pain. States this is similar to his previous exacerbations of pain. He's had no nausea, vomiting or constipation. Admits to some loose stools. No blood in the stool. Continues to complain of some dysuria though denies any penile discharge. He's had no fever or chills. Denies heavy alcohol or NSAID use. Past Medical History  Diagnosis Date  . Chronic pain    Past Surgical History  Procedure Laterality Date  . Orthopedic surgery     No family history on file. Social History  Substance Use Topics  . Smoking status: Current Every Day Smoker -- 0.50 packs/day    Types: Cigarettes  . Smokeless tobacco: None  . Alcohol Use: 16.8 oz/week    28 Cans of beer per week     Comment: daily    Review of Systems  Constitutional: Negative for fever and chills.  Respiratory: Negative for shortness of breath.   Cardiovascular: Negative for chest pain.  Gastrointestinal: Positive for abdominal pain and diarrhea. Negative for nausea, vomiting, constipation and blood in stool.  Genitourinary: Positive for dysuria. Negative for frequency, hematuria, discharge, penile swelling, scrotal swelling, enuresis, penile pain and testicular pain.  Musculoskeletal: Positive for back pain. Negative for myalgias, joint swelling, gait problem, neck pain and neck stiffness.  Neurological: Negative for dizziness, weakness, light-headedness,  numbness and headaches.  All other systems reviewed and are negative.     Allergies  Review of patient's allergies indicates no known allergies.  Home Medications   Prior to Admission medications   Medication Sig Start Date End Date Taking? Authorizing Provider  acetaminophen (TYLENOL) 325 MG tablet Take 650 mg by mouth every 6 (six) hours as needed for mild pain.   Yes Historical Provider, MD  lansoprazole (PREVACID) 30 MG capsule Take 1 capsule (30 mg total) by mouth daily at 12 noon. 02/25/15   Loren Racer, MD   BP 124/77 mmHg  Pulse 69  Temp(Src) 98.2 F (36.8 C) (Oral)  Resp 16  SpO2 98% Physical Exam  Constitutional: He is oriented to person, place, and time. He appears well-developed and well-nourished. No distress.  Patient is in no distress  HENT:  Head: Normocephalic and atraumatic.  Mouth/Throat: Oropharynx is clear and moist.  Eyes: EOM are normal. Pupils are equal, round, and reactive to light.  Neck: Normal range of motion. Neck supple.  Cardiovascular: Normal rate and regular rhythm.   Pulmonary/Chest: Effort normal and breath sounds normal. No respiratory distress. He has no wheezes. He has no rales.  Abdominal: Soft. Bowel sounds are normal. He exhibits no distension and no mass. There is tenderness (mild epigastric tenderness with palpation.). There is no rebound and no guarding.  Musculoskeletal: Normal range of motion. He exhibits no edema or tenderness.  No CVA tenderness. No midline thoracic or lumbar tenderness.  Neurological: He is alert and oriented to person, place, and time.  Moves all extremities without deficit. Sensation is fully intact.  Skin: Skin is warm  and dry. No rash noted. No erythema.  Psychiatric: He has a normal mood and affect. His behavior is normal.  Nursing note and vitals reviewed.   ED Course  Procedures (including critical care time) Labs Review Labs Reviewed  COMPREHENSIVE METABOLIC PANEL - Abnormal; Notable for the  following:    Glucose, Bld 62 (*)    ALT 14 (*)    All other components within normal limits  LIPASE, BLOOD - Abnormal; Notable for the following:    Lipase 85 (*)    All other components within normal limits  CBG MONITORING, ED - Abnormal; Notable for the following:    Glucose-Capillary 104 (*)    All other components within normal limits  CBC  URINALYSIS, ROUTINE W REFLEX MICROSCOPIC (NOT AT [facility])    Imaging Review No results found. I have personally reviewed and evaluated these images and lab results as part of my medical decision-making.   EKG Interpretation None      MDM   Final diagnoses:  Epigastric pain    Patient presents with acute exacerbation of his chronic abdominal pain. Reviewed multiple CT scans the most recent in July 2016. Patient was also seen about a week ago. Had extensive STI workup which was negative. Patient has a benign abdominal exam. We'll screen with labs and give GI cocktail. We'll refer patient to gastroenterology. Return precautions given. Patient states he did not eat dinner. Likely accounts for lower blood glucose. Given food in the emergency department which he tolerated. Repeat CBG is 104. Patient states his abdominal pain improved after GI cocktail. Mild elevation of lipase of unknown significance. May demonstrate mild pancreatitis. Patient is advised to follow-up with gastroenterology. Also advised to decrease alcohol consumption.  Loren Racer, MD 02/25/15 [phone]

## 2015-02-24 NOTE — ED Notes (Signed)
Pt reports dysuria with lower abd pain x 3 days.  Was seen at [facility] end of august for same and was treated with abx.

## 2015-02-25 LAB — CBG MONITORING, ED: Glucose-Capillary: 104 mg/dL — ABNORMAL HIGH (ref 65–99)

## 2015-02-25 LAB — LIPASE, BLOOD: Lipase: 85 U/L — ABNORMAL HIGH (ref 22–51)

## 2015-02-25 MED ORDER — LANSOPRAZOLE 30 MG PO CPDR: 30.0000 mg | DELAYED_RELEASE_CAPSULE | Freq: Every day | ORAL | Status: DC

## 2015-02-25 NOTE — ED Notes (Signed)
CBG 104 

## 2015-02-25 NOTE — Discharge Instructions (Signed)
Call and make appointment to follow-up with the gastroenterologist. Take medication as prescribed. May take over-the-counter Mylanta or Maalox as well.   Gastritis, Adult Gastritis is soreness and swelling (inflammation) of the lining of the stomach. Gastritis can develop as a sudden onset (acute) or long-term (chronic) condition. If gastritis is not treated, it can lead to stomach bleeding and ulcers. CAUSES  Gastritis occurs when the stomach lining is weak or damaged. Digestive juices from the stomach then inflame the weakened stomach lining. The stomach lining may be weak or damaged due to viral or bacterial infections. One common bacterial infection is the Helicobacter pylori infection. Gastritis can also result from excessive alcohol consumption, taking certain medicines, or having too much acid in the stomach.  SYMPTOMS  In some cases, there are no symptoms. When symptoms are present, they may include:  Pain or a burning sensation in the upper abdomen.  Nausea.  Vomiting.  An uncomfortable feeling of fullness after eating. DIAGNOSIS  Your caregiver may suspect you have gastritis based on your symptoms and a physical exam. To determine the cause of your gastritis, your caregiver may perform the following:  Blood or stool tests to check for the H pylori bacterium.  Gastroscopy. A thin, flexible tube (endoscope) is passed down the esophagus and into the stomach. The endoscope has a light and camera on the end. Your caregiver uses the endoscope to view the inside of the stomach.  Taking a tissue sample (biopsy) from the stomach to examine under a microscope. TREATMENT  Depending on the cause of your gastritis, medicines may be prescribed. If you have a bacterial infection, such as an H pylori infection, antibiotics may be given. If your gastritis is caused by too much acid in the stomach, H2 blockers or antacids may be given. Your caregiver may recommend that you stop taking aspirin,  ibuprofen, or other nonsteroidal anti-inflammatory drugs (NSAIDs). HOME CARE INSTRUCTIONS  Only take over-the-counter or prescription medicines as directed by your caregiver.  If you were given antibiotic medicines, take them as directed. Finish them even if you start to feel better.  Drink enough fluids to keep your urine clear or pale yellow.  Avoid foods and drinks that make your symptoms worse, such as:  Caffeine or alcoholic drinks.  Chocolate.  Peppermint or mint flavorings.  Garlic and onions.  Spicy foods.  Citrus fruits, such as oranges, lemons, or limes.  Tomato-based foods such as sauce, chili, salsa, and pizza.  Fried and fatty foods.  Eat small, frequent meals instead of large meals. SEEK IMMEDIATE MEDICAL CARE IF:   You have black or dark red stools.  You vomit blood or material that looks like coffee grounds.  You are unable to keep fluids down.  Your abdominal pain gets worse.  You have a fever.  You do not feel better after 1 week.  You have any other questions or concerns. MAKE SURE YOU:  Understand these instructions.  Will watch your condition.  Will get help right away if you are not doing well or get worse. Document Released: 05/30/2001 Document Revised: 12/05/2011 Document Reviewed: 07/19/2011 [facility] Patient Information 2015 [place], [place]. This information is not intended to replace advice given to you by your health care provider. Make sure you discuss any questions you have with your health care provider.

## 2015-02-25 NOTE — ED Notes (Signed)
Pt given PO fluids and sandwich. 

## 2015-04-18 IMAGING — CR DG NECK SOFT TISSUE
2 series · 2 of 2 positions shown · non-contrast
Comparison: CT cervical spine 05/26/2007

CLINICAL DATA: Foreign body in throat, initial encounter, patient
thinks she has bread or potatoes stuck in throat

EXAM:
NECK SOFT TISSUES - 1+ VIEW

[w soft tissue neck lat]
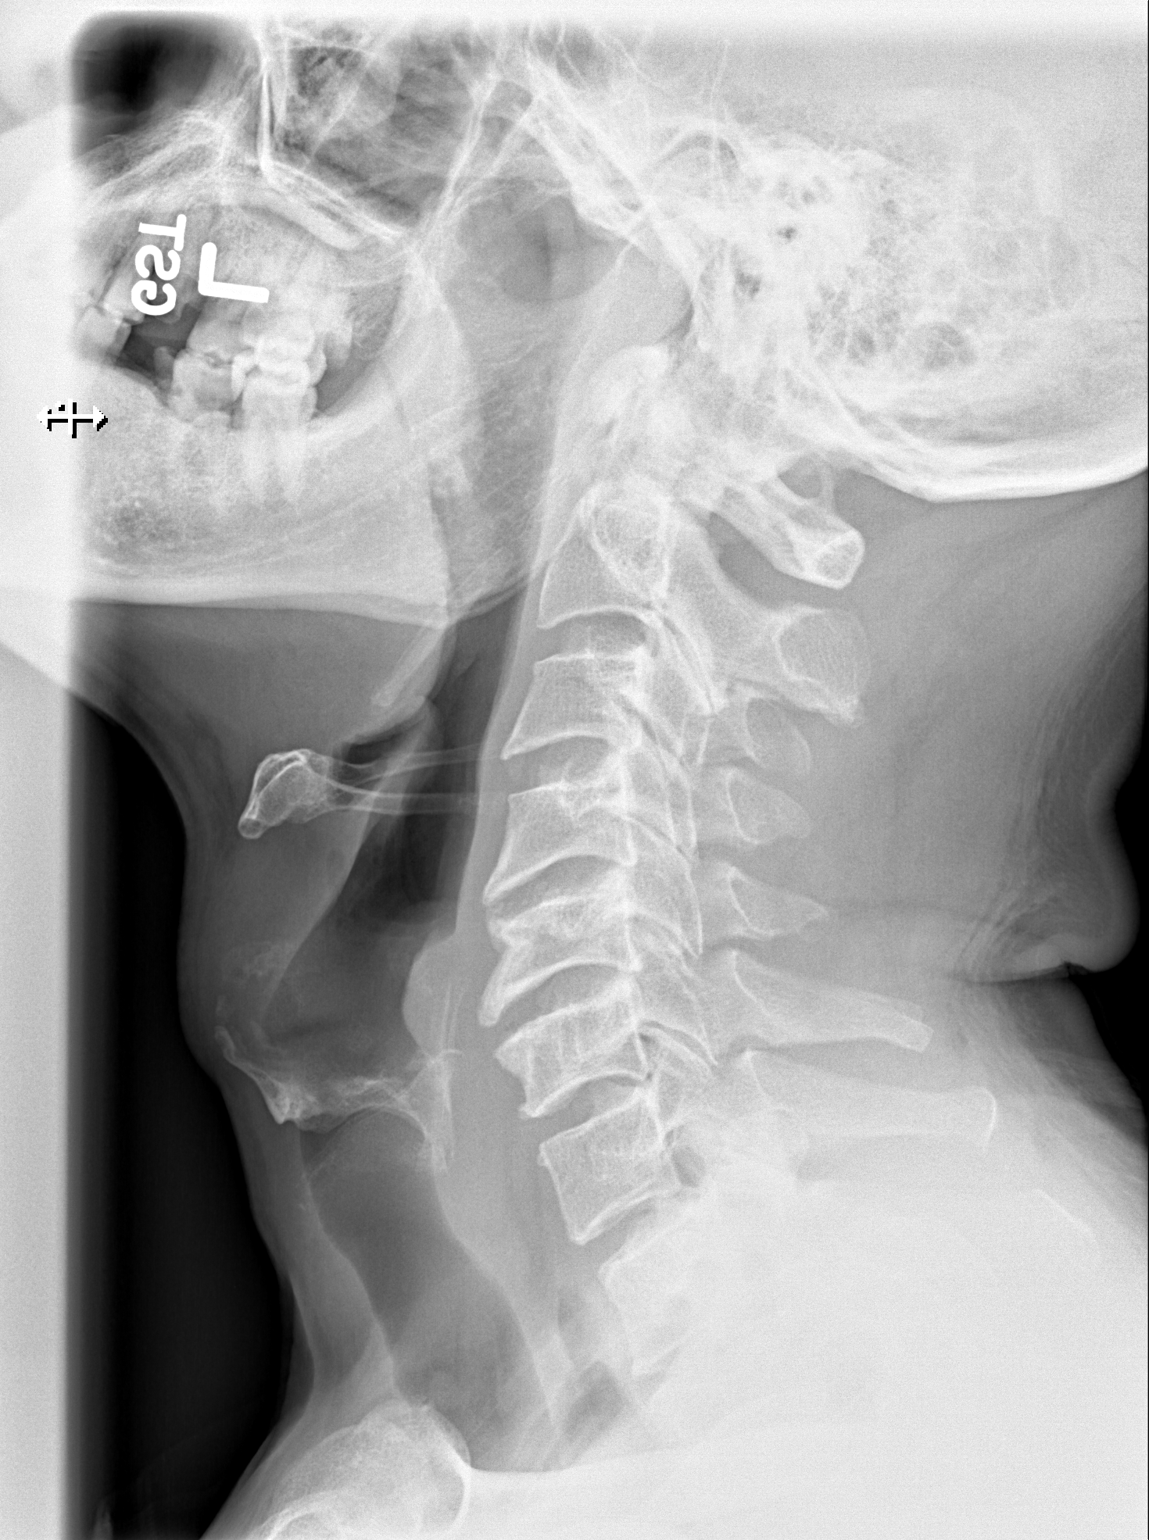

[w soft tissue neck ap]
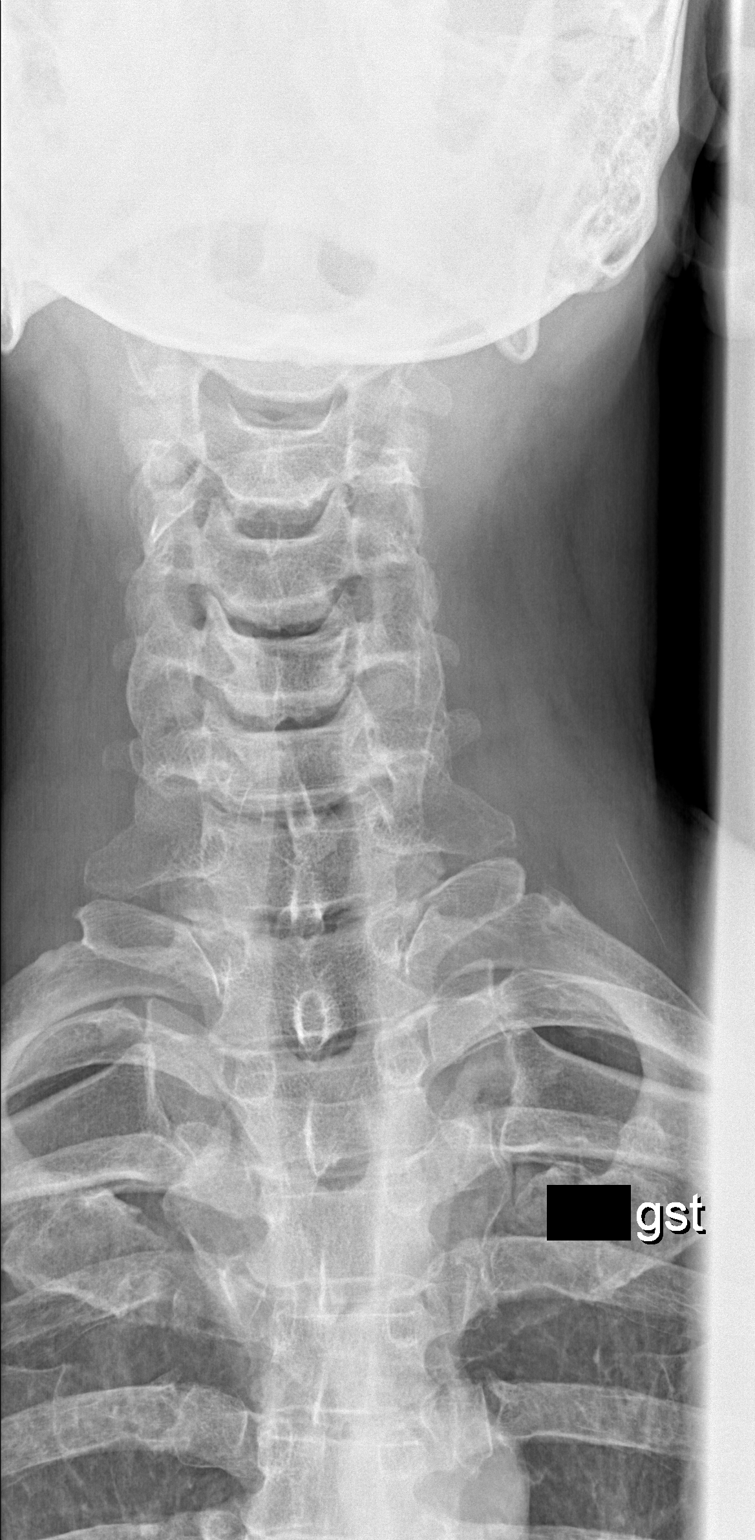

[2 of 2 positions shown; findings below may reference images not displayed]

FINDINGS: Prevertebral soft tissues normal thickness.

Airway patent.

No radiopaque foreign body identified.

Degenerative disc disease changes at C4-C5 and C5-C6.

Epiglottis and aryepiglottic folds normal appearance.
IMPRESSION: No radiopaque foreign bodies identified.

## 2015-04-19 ENCOUNTER — Emergency Department (HOSPITAL_COMMUNITY)
Admission: EM | Admit: 2015-04-19 | Discharge: 2015-04-19 | Disposition: A | Discharge status: Home / Self Care | Payer: Self-pay | Attending: Emergency Medicine | Admitting: Emergency Medicine

## 2015-04-19 ENCOUNTER — Encounter (HOSPITAL_COMMUNITY): Payer: Self-pay | Admitting: Physical Medicine and Rehabilitation

## 2015-04-19 DIAGNOSIS — R300 Dysuria: Secondary | ICD-10-CM | POA: Insufficient documentation

## 2015-04-19 DIAGNOSIS — Z720 Tobacco use: Secondary | ICD-10-CM | POA: Insufficient documentation

## 2015-04-19 DIAGNOSIS — R1084 Generalized abdominal pain: Secondary | ICD-10-CM | POA: Insufficient documentation

## 2015-04-19 DIAGNOSIS — G8929 Other chronic pain: Secondary | ICD-10-CM | POA: Insufficient documentation

## 2015-04-19 LAB — COMPREHENSIVE METABOLIC PANEL
ALT: 13 U/L — ABNORMAL LOW (ref 17–63)
AST: 18 U/L (ref 15–41)
Albumin: 3.7 g/dL (ref 3.5–5.0)
Alkaline Phosphatase: 48 U/L (ref 38–126)
Anion gap: 10 (ref 5–15)
BUN: 10 mg/dL (ref 6–20)
CO2: 23 mmol/L (ref 22–32)
Calcium: 9.1 mg/dL (ref 8.9–10.3)
Chloride: 107 mmol/L (ref 101–111)
Creatinine, Ser: 0.98 mg/dL (ref 0.61–1.24)
GFR calc Af Amer: >60 mL/min (ref >60)
GFR calc non Af Amer: >60 mL/min (ref >60)
Glucose, Bld: 101 mg/dL — ABNORMAL HIGH (ref 65–99)
Potassium: 3.9 mmol/L (ref 3.5–5.1)
Sodium: 140 mmol/L (ref 135–145)
Total Bilirubin: 0.6 mg/dL (ref 0.3–1.2)
Total Protein: 6.3 g/dL — ABNORMAL LOW (ref 6.5–8.1)

## 2015-04-19 LAB — CBC WITH DIFFERENTIAL/PLATELET
Basophils Absolute: 0 10*3/uL (ref 0.0–0.1)
Basophils Relative: 1 %
Eosinophils Absolute: 0 10*3/uL (ref 0.0–0.7)
Eosinophils Relative: 1 %
HCT: 40.4 % (ref 39.0–52.0)
Hemoglobin: 13.9 g/dL (ref 13.0–17.0)
Lymphocytes Relative: 38 %
Lymphs Abs: 1.5 10*3/uL (ref 0.7–4.0)
MCH: 30.8 pg (ref 26.0–34.0)
MCHC: 34.4 g/dL (ref 30.0–36.0)
MCV: 89.6 fL (ref 78.0–100.0)
Monocytes Absolute: 0.3 10*3/uL (ref 0.1–1.0)
Monocytes Relative: 7 %
Neutro Abs: 2.1 10*3/uL (ref 1.7–7.7)
Neutrophils Relative %: 53 %
Platelets: 134 10*3/uL — ABNORMAL LOW (ref 150–400)
RBC: 4.51 MIL/uL (ref 4.22–5.81)
RDW: 13.2 % (ref 11.5–15.5)
WBC: 3.8 10*3/uL — ABNORMAL LOW (ref 4.0–10.5)

## 2015-04-19 LAB — URINALYSIS, ROUTINE W REFLEX MICROSCOPIC
Bilirubin Urine: NEGATIVE
Glucose, UA: NEGATIVE mg/dL
Hgb urine dipstick: NEGATIVE
Ketones, ur: NEGATIVE mg/dL
Leukocytes, UA: NEGATIVE
Nitrite: NEGATIVE
Protein, ur: NEGATIVE mg/dL
Specific Gravity, Urine: 1.017 (ref 1.005–1.030)
Urobilinogen, UA: 0.2 mg/dL (ref 0.0–1.0)
pH: 5 (ref 5.0–8.0)

## 2015-04-19 LAB — LIPASE, BLOOD: Lipase: 32 U/L (ref 11–51)

## 2015-04-19 LAB — POC OCCULT BLOOD, ED: Fecal Occult Bld: NEGATIVE

## 2015-04-19 MED ORDER — SODIUM CHLORIDE 0.9 % IV BOLUS (SEPSIS)
1000.0000 mL | Freq: Once | INTRAVENOUS | Status: AC
  Administered 2015-04-19: 1000 mL via INTRAVENOUS

## 2015-04-19 MED ORDER — FAMOTIDINE 20 MG PO TABS
40.0000 mg | ORAL_TABLET | Freq: Once | ORAL | Status: AC
  Administered 2015-04-19: 40 mg via ORAL
  Filled 2015-04-19: qty 2

## 2015-04-19 MED ORDER — HYDROCODONE-ACETAMINOPHEN 5-325 MG PO TABS: 1.0000 | ORAL_TABLET | ORAL | Status: DC | PRN

## 2015-04-19 MED ORDER — GI COCKTAIL ~~LOC~~
30.0000 mL | Freq: Once | ORAL | Status: AC
  Administered 2015-04-19: 30 mL via ORAL
  Filled 2015-04-19: qty 30

## 2015-04-19 MED ORDER — RANITIDINE HCL 150 MG PO TABS: 150.0000 mg | ORAL_TABLET | Freq: Two times a day (BID) | ORAL | Status: DC | PRN

## 2015-04-19 MED ORDER — KETOROLAC TROMETHAMINE 30 MG/ML IJ SOLN
30.0000 mg | Freq: Once | INTRAMUSCULAR | Status: AC
  Administered 2015-04-19: 30 mg via INTRAVENOUS
  Filled 2015-04-19: qty 1

## 2015-04-19 MED ORDER — ONDANSETRON HCL 4 MG/2ML IJ SOLN
4.0000 mg | Freq: Once | INTRAMUSCULAR | Status: AC
  Administered 2015-04-19: 4 mg via INTRAVENOUS
  Filled 2015-04-19: qty 2

## 2015-04-19 NOTE — ED Notes (Signed)
Pt presents to department for evaluation of dysuria and diffuse abdominal pain. Ongoing for several days. Pt states "I think I could have an STD." pt is alert and oriented x4. NAD

## 2015-04-19 NOTE — ED Provider Notes (Signed)
CSN: [id number]     Arrival date & time 04/19/15  0845 History   First MD Initiated Contact with Patient 04/19/15 0848     Chief Complaint  Patient presents with  . Dysuria  . Abdominal Pain     (Consider location/radiation/quality/duration/timing/severity/associated sxs/prior Treatment) Patient is a 54 y.o. male presenting with dysuria. The history is provided by the patient.  Dysuria This is a recurrent problem. The current episode started more than 1 month ago. The problem occurs intermittently. The problem has been unchanged. Associated symptoms include abdominal pain. Pertinent negatives include no chest pain, fever, headaches, nausea, rash, vomiting or weakness. Nothing aggravates the symptoms. He has tried nothing for the symptoms.    Past Medical History  Diagnosis Date  . Chronic pain    Past Surgical History  Procedure Laterality Date  . Orthopedic surgery     History reviewed. No pertinent family history. Social History  Substance Use Topics  . Smoking status: Current Every Day Smoker -- 0.50 packs/day    Types: Cigarettes  . Smokeless tobacco: None  . Alcohol Use: 16.8 oz/week    28 Cans of beer per week     Comment: daily    Review of Systems  Constitutional: Negative for fever.  HENT: Negative for facial swelling.   Respiratory: Negative for shortness of breath.   Cardiovascular: Negative for chest pain.  Gastrointestinal: Positive for abdominal pain. Negative for nausea, vomiting, diarrhea, constipation, blood in stool, abdominal distention, anal bleeding and rectal pain.  Genitourinary: Positive for dysuria.  Musculoskeletal: Negative for back pain.  Skin: Negative for rash.  Neurological: Negative for weakness and headaches.  Psychiatric/Behavioral: Negative for confusion.      Allergies  Review of patient's allergies indicates no known allergies.  Home Medications   Prior to Admission medications   Medication Sig Start Date End Date Taking?  Authorizing Provider  acetaminophen (TYLENOL) 325 MG tablet Take 650 mg by mouth every 6 (six) hours as needed for mild pain.   Yes Historical Provider, MD  HYDROcodone-acetaminophen (NORCO/VICODIN) 5-325 MG tablet Take 1 tablet by mouth every 4 (four) hours as needed. 04/19/15   Gavin PoundJustin Catrena Vari, MD  ranitidine (ZANTAC) 150 MG tablet Take 1 tablet (150 mg total) by mouth 2 (two) times daily as needed for heartburn. 04/19/15   Gavin PoundJustin Heydy Montilla, MD   BP 134/76 mmHg  Pulse 68  Temp(Src) 98.6 F (37 C) (Oral)  Resp 18  Ht 5\' 9"  (1.753 m)  Wt 192 lb (87.091 kg)  BMI 28.34 kg/m2  SpO2 97% Physical Exam  Constitutional: He is oriented to person, place, and time. He appears well-developed and well-nourished. No distress.  HENT:  Head: Normocephalic and atraumatic.  Right Ear: External ear normal.  Left Ear: External ear normal.  Nose: Nose normal.  Mouth/Throat: Oropharynx is clear and moist. No oropharyngeal exudate.  Eyes: Conjunctivae and EOM are normal. Pupils are equal, round, and reactive to light. Right eye exhibits no discharge. Left eye exhibits no discharge. No scleral icterus.  Neck: Normal range of motion. Neck supple. No JVD present. No tracheal deviation present. No thyromegaly present.  Cardiovascular: Normal rate, regular rhythm and intact distal pulses.   Pulmonary/Chest: Effort normal. No stridor. No respiratory distress.  Abdominal: Soft. He exhibits no distension. There is generalized tenderness. There is negative Murphy's sign. Hernia confirmed negative in the right inguinal area and confirmed negative in the left inguinal area.  Mild general TTP.  Genitourinary: Rectum normal, testes normal and penis normal. Guaiac  negative stool. Prostate is enlarged. Prostate is not tender.  Musculoskeletal: Normal range of motion. He exhibits no edema or tenderness.  Lymphadenopathy:    He has no cervical adenopathy.       Right: No inguinal adenopathy present.       Left: No inguinal  adenopathy present.  Neurological: He is alert and oriented to person, place, and time.  Skin: Skin is warm and dry. No rash noted. He is not diaphoretic. No erythema. No pallor.  Psychiatric: He has a normal mood and affect. His behavior is normal. Judgment and thought content normal.  Nursing note and vitals reviewed.   ED Course  Procedures (including critical care time) Labs Review Labs Reviewed  CBC WITH DIFFERENTIAL/PLATELET - Abnormal; Notable for the following:    WBC 3.8 (*)    Platelets 134 (*)    All other components within normal limits  COMPREHENSIVE METABOLIC PANEL - Abnormal; Notable for the following:    Glucose, Bld 101 (*)    Total Protein 6.3 (*)    ALT 13 (*)    All other components within normal limits  URINALYSIS, ROUTINE W REFLEX MICROSCOPIC (NOT AT [facility])  LIPASE, BLOOD  POC OCCULT BLOOD, ED  GC/CHLAMYDIA PROBE AMP () NOT AT [facility]   MDM   Final diagnoses:  Generalized abdominal pain  Dysuria    Patient has been seen multiple times recently due to concerns for STDs. He endorses some dysuria along with difficulty initiating urination. Also some generalized abdominal pain. Abdominal pain is nonspecific. Patient has had some pain after eating, no significant nausea or vomiting. No testicular pain. On exam patient has mildly enlarged but nontender prostate. No other nose noted to the scrotum or testicles, or penile discharge. GC testing sent. Do not feel he needs empiric GC treatment at this time urine shows no leukocytes. PH is low. Patient endorses high intake of soft drinks. Advised patient's of his dysuria may be coming from this, also potentially related to his enlarged prostate. Recommended increased water intake. Patient does not have any flank pain, hematuria, or other symptoms as associated with renal calculus. No hernias on exam. Exam is not consistent with appendicitis or gallbladder disease. Advised patient to follow up with urology for repeat  evaluation. Advised patient to use condoms for STI prevention. Patient's symptoms improved with medications received in the emergency department and IV fluid. Also recommended a H2 blockers and dietary changes as patient has a history of GERD but has not been taking anything recently.  Patient was given return precautions for abdominal pain and dysuria.  Pt advised on use of medications as applicable.  Advised to return for actely worsening symptoms, inability to take medications, or other acute concerns.  Advised to follow up with urology in 1 week.  Also provided follow up information for PCP.  Patient was in agreement with and expressed understanding of follow plan, plan of care, and return precautions.  All questions answered prior to discharge.  Patient was discharged in stable condition, ambulating without difficulty.  Patient care was discussed with my attending, Dr. Blinda Leatherwood.     Gavin Pound, MD 04/20/15 1541  Gavin Pound, MD 04/23/15 2841  Gilda Crease, MD 04/23/15 [phone]

## 2015-04-19 NOTE — ED Provider Notes (Signed)
Patient presented to the ER with dysuria and abdominal discomfort. Patient reports that he has had similar symptoms with STD.   Face to face Exam: HEENT - PERRLA Lungs - CTAB Heart - RRR, no M/R/G Abd - S/NT/ND Neuro - alert, oriented x3  Plan: Basic workup for abdominal discomfort, check prostate.  Gilda Creasehristopher J Melaina Howerton, MD 04/19/15 [phone]

## 2015-04-19 NOTE — ED Notes (Signed)
Resident at the bedside

## 2015-04-19 NOTE — Discharge Instructions (Signed)

## 2015-06-29 ENCOUNTER — Emergency Department (HOSPITAL_COMMUNITY)
Admission: EM | Admit: 2015-06-29 | Discharge: 2015-06-29 | Disposition: A | Discharge status: Home / Self Care | Payer: Self-pay | Attending: Emergency Medicine | Admitting: Emergency Medicine

## 2015-06-29 ENCOUNTER — Encounter (HOSPITAL_COMMUNITY): Payer: Self-pay | Admitting: Emergency Medicine

## 2015-06-29 DIAGNOSIS — F17210 Nicotine dependence, cigarettes, uncomplicated: Secondary | ICD-10-CM | POA: Insufficient documentation

## 2015-06-29 DIAGNOSIS — Z79899 Other long term (current) drug therapy: Secondary | ICD-10-CM | POA: Insufficient documentation

## 2015-06-29 DIAGNOSIS — G8929 Other chronic pain: Secondary | ICD-10-CM | POA: Insufficient documentation

## 2015-06-29 DIAGNOSIS — K0889 Other specified disorders of teeth and supporting structures: Secondary | ICD-10-CM | POA: Insufficient documentation

## 2015-06-29 DIAGNOSIS — K029 Dental caries, unspecified: Secondary | ICD-10-CM | POA: Insufficient documentation

## 2015-06-29 MED ORDER — PENICILLIN V POTASSIUM 250 MG PO TABS
500.0000 mg | ORAL_TABLET | Freq: Once | ORAL | Status: AC
  Administered 2015-06-29: 500 mg via ORAL
  Filled 2015-06-29: qty 2

## 2015-06-29 MED ORDER — OXYCODONE-ACETAMINOPHEN 5-325 MG PO TABS
1.0000 | ORAL_TABLET | Freq: Once | ORAL | Status: AC
  Administered 2015-06-29: 1 via ORAL
  Filled 2015-06-29: qty 1

## 2015-06-29 MED ORDER — OXYCODONE-ACETAMINOPHEN 5-325 MG PO TABS: 2.0000 | ORAL_TABLET | ORAL | Status: DC | PRN

## 2015-06-29 MED ORDER — PENICILLIN V POTASSIUM 500 MG PO TABS: 500.0000 mg | ORAL_TABLET | Freq: Two times a day (BID) | ORAL | Status: AC

## 2015-06-29 NOTE — ED Provider Notes (Signed)
CSN: [id number]     Arrival date & time 06/29/15  1202 History  By signing my name below, I, Steve Anderson, attest that this documentation has been prepared under the direction and in the presence of H&R BlockHannah Patel-Mills, PA-C. Electronically Signed: Elon SpannerGarrett Anderson ED Scribe. 06/29/2015. 12:11 PM.    Chief Complaint  Patient presents with  . Dental Pain   The history is provided by the patient. No language interpreter was used.   HPI Comments: Steve Anderson is a 55 y.o. male who presents to the Emergency Department complaining of constant, moderate right upper dental pain emmanating from a suspected cavity onset 6 days ago; mildly improved with Tylenol.  Patient is not followed by a dentist.  Denies any fever or facial swelling.  Past Medical History  Diagnosis Date  . Chronic pain    Past Surgical History  Procedure Laterality Date  . Orthopedic surgery     History reviewed. No pertinent family history. Social History  Substance Use Topics  . Smoking status: Current Every Day Smoker -- 0.50 packs/day    Types: Cigarettes  . Smokeless tobacco: None  . Alcohol Use: 16.8 oz/week    28 Cans of beer per week     Comment: daily    Review of Systems  Constitutional: Negative for fever.  HENT: Positive for dental problem.       Allergies  Review of patient's allergies indicates no known allergies.  Home Medications   Prior to Admission medications   Medication Sig Start Date End Date Taking? Authorizing Provider  acetaminophen (TYLENOL) 325 MG tablet Take 650 mg by mouth every 6 (six) hours as needed for mild pain.    Historical Provider, MD  HYDROcodone-acetaminophen (NORCO/VICODIN) 5-325 MG tablet Take 1 tablet by mouth every 4 (four) hours as needed. 04/19/15   Gavin PoundJustin Brooten, MD  oxyCODONE-acetaminophen (PERCOCET/ROXICET) 5-325 MG tablet Take 2 tablets by mouth every 4 (four) hours as needed for severe pain. 06/29/15   Tyus Kallam Patel-Mills, PA-C  penicillin v potassium (VEETID)  500 MG tablet Take 1 tablet (500 mg total) by mouth 2 (two) times daily at 10 AM and 5 PM. 06/29/15 07/09/15  Cullen Vanallen Patel-Mills, PA-C  ranitidine (ZANTAC) 150 MG tablet Take 1 tablet (150 mg total) by mouth 2 (two) times daily as needed for heartburn. 04/19/15   Gavin PoundJustin Brooten, MD   BP 124/79 mmHg  Pulse 85  Temp(Src) 97.9 F (36.6 C) (Oral)  Resp 16  Ht 5\' 9"  (1.753 m)  Wt 87.091 kg  BMI 28.34 kg/m2  SpO2 99% Physical Exam  Constitutional: He is oriented to person, place, and time. He appears well-developed and well-nourished. No distress.  HENT:  Head: Normocephalic and atraumatic.  Dental caries and several missing teeth throughout.  Plaque buildup.  No fluctuance or facial swelling.  Tenderness along the right incisor.   Eyes: Conjunctivae and EOM are normal.  Neck: Neck supple. No tracheal deviation present.  Cardiovascular: Normal rate.   Pulmonary/Chest: Effort normal. No respiratory distress.  Musculoskeletal: Normal range of motion.  Neurological: He is alert and oriented to person, place, and time.  Skin: Skin is warm and dry.  Psychiatric: He has a normal mood and affect. His behavior is normal.  Nursing note and vitals reviewed.   ED Course  Procedures (including critical care time)  DIAGNOSTIC STUDIES: Oxygen Saturation is 99% on RA, normal by my interpretation.    COORDINATION OF CARE:  1:22 PM Will order pain medication.  Discussed plan to prescribe  antibiotic and provide dental resource guide for f/u.    Labs Review Labs Reviewed - No data to display  Imaging Review No results found.    EKG Interpretation None      MDM   Final diagnoses:  Pain, dental  Patient with toothache.  No gross abscess.  Exam unconcerning for Ludwig's angina or spread of infection.  Will treat with penicillin and pain medicine.  Urged patient to follow-up with dentist and was given several dental referrals. Return precautions were discussed and patient agrees with  plan. Filed Vitals:   06/29/15 1213  BP: 124/79  Pulse: 85  Temp: 97.9 F (36.6 C)  Resp: 16  I personally performed the services described in this documentation, which was scribed in my presence. The recorded information has been reviewed and is accurate.   Catha Gosselin, PA-C 06/29/15 1516  Azalia Bilis, MD 06/30/15 [phone]

## 2015-06-29 NOTE — ED Notes (Signed)
Pain has tooth pain to the right upper tooth ongoing x couple days.

## 2015-08-06 ENCOUNTER — Emergency Department (HOSPITAL_COMMUNITY)
Admission: EM | Admit: 2015-08-06 | Discharge: 2015-08-06 | Disposition: A | Discharge status: Home / Self Care | Payer: Self-pay | Attending: Emergency Medicine | Admitting: Emergency Medicine

## 2015-08-06 ENCOUNTER — Encounter (HOSPITAL_COMMUNITY): Payer: Self-pay | Admitting: Emergency Medicine

## 2015-08-06 ENCOUNTER — Emergency Department (HOSPITAL_COMMUNITY): Payer: Self-pay

## 2015-08-06 DIAGNOSIS — Y998 Other external cause status: Secondary | ICD-10-CM | POA: Insufficient documentation

## 2015-08-06 DIAGNOSIS — S8992XA Unspecified injury of left lower leg, initial encounter: Secondary | ICD-10-CM | POA: Insufficient documentation

## 2015-08-06 DIAGNOSIS — W1839XA Other fall on same level, initial encounter: Secondary | ICD-10-CM | POA: Insufficient documentation

## 2015-08-06 DIAGNOSIS — F17210 Nicotine dependence, cigarettes, uncomplicated: Secondary | ICD-10-CM | POA: Insufficient documentation

## 2015-08-06 DIAGNOSIS — Y9389 Activity, other specified: Secondary | ICD-10-CM | POA: Insufficient documentation

## 2015-08-06 DIAGNOSIS — S79912A Unspecified injury of left hip, initial encounter: Secondary | ICD-10-CM | POA: Insufficient documentation

## 2015-08-06 DIAGNOSIS — G8929 Other chronic pain: Secondary | ICD-10-CM | POA: Insufficient documentation

## 2015-08-06 DIAGNOSIS — Y9289 Other specified places as the place of occurrence of the external cause: Secondary | ICD-10-CM | POA: Insufficient documentation

## 2015-08-06 DIAGNOSIS — M25552 Pain in left hip: Secondary | ICD-10-CM

## 2015-08-06 MED ORDER — IBUPROFEN 800 MG PO TABS: 800.0000 mg | ORAL_TABLET | Freq: Three times a day (TID) | ORAL | Status: DC

## 2015-08-06 MED ORDER — KETOROLAC TROMETHAMINE 30 MG/ML IJ SOLN
30.0000 mg | Freq: Once | INTRAMUSCULAR | Status: AC
  Administered 2015-08-06: 30 mg via INTRAMUSCULAR
  Filled 2015-08-06: qty 1

## 2015-08-06 NOTE — ED Notes (Signed)
Pt. reports left hip pain radiating to left lower leg onset last week , pt. stated pain worsened 2 days ago after a fall , ambulatory , pain increases with movement .

## 2015-08-06 NOTE — Discharge Instructions (Signed)
Please read attached information. If you experience any new or worsening signs or symptoms please return to the emergency room for evaluation. Please follow-up with your primary care provider or specialist as discussed. Please use medication prescribed only as directed and discontinue taking if you have any concerning signs or symptoms.   °

## 2015-08-06 NOTE — ED Provider Notes (Signed)
CSN: [id number]     Arrival date & time 08/06/15  1858 History   First MD Initiated Contact with Patient 08/06/15 1925     Chief Complaint  Patient presents with  . Hip Pain  . Leg Pain   HPI   55 year old male presents today with left hip pain. Patient reports a 7 days ago he fell off a porch landing on his left hip and side. He reports pain at that time describes it as sharp in nature, worse with ambulation. He reports he has been able to continue working, working today cause significant pain to the left posterior aspect of his hip. He denies any loss of distal sensation strength or motor function. He reports pain down the lateral aspect of his leg, denies any signs of trauma. Patient has no other complaints in addition to those. Patient reports using BC powders at home with minimal relief of symptoms. No other medications tried prior to arrival.  Past Medical History  Diagnosis Date  . Chronic pain    Past Surgical History  Procedure Laterality Date  . Orthopedic surgery     No family history on file. Social History  Substance Use Topics  . Smoking status: Current Every Day Smoker -- 0.50 packs/day    Types: Cigarettes  . Smokeless tobacco: None  . Alcohol Use: 16.8 oz/week    28 Cans of beer per week     Comment: daily    Review of Systems  All other systems reviewed and are negative.   Allergies  Review of patient's allergies indicates no known allergies.  Home Medications   Prior to Admission medications   Medication Sig Start Date End Date Taking? Authorizing Provider  acetaminophen (TYLENOL) 325 MG tablet Take 650 mg by mouth every 6 (six) hours as needed for mild pain.    Historical Provider, MD  HYDROcodone-acetaminophen (NORCO/VICODIN) 5-325 MG tablet Take 1 tablet by mouth every 4 (four) hours as needed. 04/19/15   Gavin Pound, MD  ibuprofen (ADVIL,MOTRIN) 800 MG tablet Take 1 tablet (800 mg total) by mouth 3 (three) times daily. 08/06/15   Eyvonne Mechanic,  PA-C  oxyCODONE-acetaminophen (PERCOCET/ROXICET) 5-325 MG tablet Take 2 tablets by mouth every 4 (four) hours as needed for severe pain. 06/29/15   Hanna Patel-Mills, PA-C  ranitidine (ZANTAC) 150 MG tablet Take 1 tablet (150 mg total) by mouth 2 (two) times daily as needed for heartburn. 04/19/15   Gavin Pound, MD   BP 133/77 mmHg  Pulse 69  Temp(Src) 98.3 F (36.8 C) (Oral)  Resp 16  Ht  (1.753 m)  Wt 86.183 kg  BMI 28.05 kg/m2  SpO2 97%   Physical Exam  Constitutional: He is oriented to person, place, and time. He appears well-developed and well-nourished. No distress.  HENT:  Head: Normocephalic and atraumatic.  Eyes: Conjunctivae are normal. Pupils are equal, round, and reactive to light. Right eye exhibits no discharge. Left eye exhibits no discharge. No scleral icterus.  Neck: Normal range of motion. Neck supple. No JVD present. No tracheal deviation present.  Pulmonary/Chest: Effort normal. No stridor.  Musculoskeletal: Normal range of motion. He exhibits tenderness. He exhibits no edema.  No C, T, or L spine tenderness to palpation. No obvious signs of trauma, deformity, infection, step-offs. Lung expansion normal. No scoliosis or kyphosis. Bilateral lower extremity strength 5 out of 5, sensation grossly intact, pedal pulses 2+, Refill less than 3 seconds.  Pain to palpation of the left lateral and posterior hip, and lateral  lower extremity. No signs of trauma, full active range of motion of the hip with pain to flexion internal/external rotation. Patient is exquisitely tender to even light touch of his entire leg, and winces even before I touch him.  Neurological: He is alert and oriented to person, place, and time. Coordination normal.  Skin: Skin is warm and dry. He is not diaphoretic.  Psychiatric: He has a normal mood and affect. His behavior is normal. Judgment and thought content normal.  Nursing note and vitals reviewed.   ED Course  Procedures (including  critical care time) Labs Review Labs Reviewed - No data to display  Imaging Review Dg Hip Unilat With Pelvis 2-3 Views Left  08/06/2015  CLINICAL DATA:  55 year old male with fall and left hip pain. EXAM: DG HIP (WITH OR WITHOUT PELVIS) 2-3V LEFT COMPARISON:  CT dated 12/19/2014 FINDINGS: There is no acute fracture or dislocation. 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 The soft tissues appear unremarkable. IMPRESSION: No acute osseous pathology. Electronically Signed   By: Elgie Collard M.D.   On: 08/06/2015 20:30   I have personally reviewed and evaluated these images and lab results as part of my medical decision-making.   EKG Interpretation None      MDM   Final diagnoses:  Hip pain, left    Labs:  Imaging: DG hip unilateral with pelvis negative  Consults:  Therapeutics: Toradol  Discharge Meds: Ibuprofen  Assessment/Plan: Patient's 55 year old male presents today with complaints of left hip pain. Patient pain out of proportion to findings. Patient was given a shot of Toradol with plain films of his hip. Patient was able to ambulate and work today, but during my exam was very slow to move and tenderness with even light touch. Patient has no significant findings on his plain films, no signs of trauma, he will be discharged home with symptomatic care instructions including ibuprofen or Tylenol, follow up with primary care if symptoms persist, return if it worsens.         Eyvonne Mechanic, PA-C 08/06/15 2353  Raeford Razor, MD 08/07/15 [phone]

## 2015-11-19 ENCOUNTER — Encounter (HOSPITAL_COMMUNITY): Payer: Self-pay | Admitting: Emergency Medicine

## 2015-11-19 DIAGNOSIS — F17210 Nicotine dependence, cigarettes, uncomplicated: Secondary | ICD-10-CM | POA: Insufficient documentation

## 2015-11-19 DIAGNOSIS — M545 Low back pain: Secondary | ICD-10-CM | POA: Insufficient documentation

## 2015-11-19 DIAGNOSIS — G8929 Other chronic pain: Secondary | ICD-10-CM | POA: Insufficient documentation

## 2015-11-19 DIAGNOSIS — R079 Chest pain, unspecified: Secondary | ICD-10-CM | POA: Insufficient documentation

## 2015-11-19 DIAGNOSIS — R300 Dysuria: Secondary | ICD-10-CM | POA: Insufficient documentation

## 2015-11-19 DIAGNOSIS — R1084 Generalized abdominal pain: Secondary | ICD-10-CM | POA: Insufficient documentation

## 2015-11-19 DIAGNOSIS — Z791 Long term (current) use of non-steroidal anti-inflammatories (NSAID): Secondary | ICD-10-CM | POA: Insufficient documentation

## 2015-11-19 LAB — CBC
HCT: 41.2 % (ref 39.0–52.0)
Hemoglobin: 14 g/dL (ref 13.0–17.0)
MCH: 29.9 pg (ref 26.0–34.0)
MCHC: 34 g/dL (ref 30.0–36.0)
MCV: 87.8 fL (ref 78.0–100.0)
Platelets: 179 10*3/uL (ref 150–400)
RBC: 4.69 MIL/uL (ref 4.22–5.81)
RDW: 13.9 % (ref 11.5–15.5)
WBC: 4.9 10*3/uL (ref 4.0–10.5)

## 2015-11-19 LAB — COMPREHENSIVE METABOLIC PANEL
ALT: 11 U/L — ABNORMAL LOW (ref 17–63)
AST: 18 U/L (ref 15–41)
Albumin: 3.6 g/dL (ref 3.5–5.0)
Alkaline Phosphatase: 56 U/L (ref 38–126)
Anion gap: 9 (ref 5–15)
BUN: 10 mg/dL (ref 6–20)
CO2: 23 mmol/L (ref 22–32)
Calcium: 9.2 mg/dL (ref 8.9–10.3)
Chloride: 109 mmol/L (ref 101–111)
Creatinine, Ser: 1.16 mg/dL (ref 0.61–1.24)
GFR calc Af Amer: >60 mL/min (ref >60)
GFR calc non Af Amer: >60 mL/min (ref >60)
Glucose, Bld: 88 mg/dL (ref 65–99)
Potassium: 3.7 mmol/L (ref 3.5–5.1)
Sodium: 141 mmol/L (ref 135–145)
Total Bilirubin: 0.7 mg/dL (ref 0.3–1.2)
Total Protein: 6.7 g/dL (ref 6.5–8.1)

## 2015-11-19 LAB — URINALYSIS, ROUTINE W REFLEX MICROSCOPIC
Bilirubin Urine: NEGATIVE
Glucose, UA: NEGATIVE mg/dL
Hgb urine dipstick: NEGATIVE
Ketones, ur: NEGATIVE mg/dL
Leukocytes, UA: NEGATIVE
Nitrite: NEGATIVE
Protein, ur: NEGATIVE mg/dL
Specific Gravity, Urine: 1.018 (ref 1.005–1.030)
pH: 5.5 (ref 5.0–8.0)

## 2015-11-19 LAB — LIPASE, BLOOD: Lipase: 31 U/L (ref 11–51)

## 2015-11-19 NOTE — ED Notes (Signed)
C/o lower abd pain that radiates to lower back and groin pain x 3-4 days.  Denies nausea, vomiting, diarrhea, and constipation.  Reports burning with urination.  Denies discharge.

## 2015-11-20 ENCOUNTER — Emergency Department (HOSPITAL_COMMUNITY)
Admission: EM | Admit: 2015-11-20 | Discharge: 2015-11-20 | Disposition: A | Discharge status: Home / Self Care | Payer: Self-pay | Attending: Emergency Medicine | Admitting: Emergency Medicine

## 2015-11-20 DIAGNOSIS — R300 Dysuria: Secondary | ICD-10-CM

## 2015-11-20 MED ORDER — AZITHROMYCIN 250 MG PO TABS
1000.0000 mg | ORAL_TABLET | Freq: Once | ORAL | Status: AC
  Administered 2015-11-20: 1000 mg via ORAL
  Filled 2015-11-20: qty 4

## 2015-11-20 MED ORDER — LIDOCAINE HCL (PF) 1 % IJ SOLN
INTRAMUSCULAR | Status: AC
  Administered 2015-11-20: 5 mL
  Filled 2015-11-20: qty 5

## 2015-11-20 MED ORDER — CEFTRIAXONE SODIUM 250 MG IJ SOLR
250.0000 mg | Freq: Once | INTRAMUSCULAR | Status: AC
  Administered 2015-11-20: 250 mg via INTRAMUSCULAR
  Filled 2015-11-20: qty 250

## 2015-11-20 NOTE — ED Provider Notes (Signed)
CSN: [id number]     Arrival date & time 11/19/15  2158 History  By signing my name below, I, Doreatha Martin, attest that this documentation has been prepared under the direction and in the presence of Zadie Rhine, MD. Electronically Signed: Doreatha Martin, ED Scribe. 11/20/2015. 2:39 AM.    Chief Complaint  Patient presents with  . Abdominal Pain  . Groin Pain   Patient is a 55 y.o. male presenting with groin pain. The history is provided by the patient. No language interpreter was used.  Groin Pain This is a new problem. The current episode started more than 2 days ago. The problem occurs constantly. The problem has been gradually worsening. Associated symptoms include chest pain (ongoing, unchanged) and abdominal pain. Pertinent negatives include no shortness of breath. Nothing aggravates the symptoms. Nothing relieves the symptoms. He has tried nothing for the symptoms. The treatment provided no relief.   HPI Comments: Steve Anderson is a 55 y.o. male with h/o chronic pain who presents to the Emergency Department complaining of moderate, worsening lower abdominal pain with radiation to the groin onset 4 days ago. Pt reports associated dysuria and lower back pain. He states he has recently had unprotected sex and reports concern for STD.  Pt also complains of intermittent, substernal CP, which is ongoing and not worsened tonight. No worsening or alleviating factors noted. No h/o abdominal surgeries. He denies nausea, diarrhea, constipation, fever, emesis, SOB, penile discharge.    Past Medical History  Diagnosis Date  . Chronic pain    Past Surgical History  Procedure Laterality Date  . Orthopedic surgery     No family history on file. Social History  Substance Use Topics  . Smoking status: Current Every Day Smoker -- 0.50 packs/day    Types: Cigarettes  . Smokeless tobacco: None  . Alcohol Use: 16.8 oz/week    28 Cans of beer per week     Comment: daily    Review of Systems   Constitutional: Negative for fever.  Respiratory: Negative for shortness of breath.   Cardiovascular: Positive for chest pain (ongoing, unchanged).  Gastrointestinal: Positive for abdominal pain. Negative for nausea, vomiting, diarrhea and constipation.  Genitourinary: Positive for dysuria. Negative for discharge.  All other systems reviewed and are negative.  Allergies  Review of patient's allergies indicates no known allergies.  Home Medications   Prior to Admission medications   Medication Sig Start Date End Date Taking? Authorizing Provider  acetaminophen (TYLENOL) 325 MG tablet Take 650 mg by mouth every 6 (six) hours as needed for mild pain.    Historical Provider, MD  HYDROcodone-acetaminophen (NORCO/VICODIN) 5-325 MG tablet Take 1 tablet by mouth every 4 (four) hours as needed. 04/19/15   Gavin Pound, MD  ibuprofen (ADVIL,MOTRIN) 800 MG tablet Take 1 tablet (800 mg total) by mouth 3 (three) times daily. 08/06/15   Eyvonne Mechanic, PA-C  oxyCODONE-acetaminophen (PERCOCET/ROXICET) 5-325 MG tablet Take 2 tablets by mouth every 4 (four) hours as needed for severe pain. 06/29/15   Hanna Patel-Mills, PA-C  ranitidine (ZANTAC) 150 MG tablet Take 1 tablet (150 mg total) by mouth 2 (two) times daily as needed for heartburn. 04/19/15   Gavin Pound, MD   BP 129/79 mmHg  Pulse 72  Temp(Src) 97.9 F (36.6 C) (Oral)  Resp 16  SpO2 100% Physical Exam CONSTITUTIONAL: Well developed/well nourished HEAD: Normocephalic/atraumatic ENMT: Mucous membranes moist NECK: supple no meningeal signs SPINE/BACK:entire spine nontender CV: S1/S2 noted, no murmurs/rubs/gallops noted LUNGS: Lungs are clear to  auscultation bilaterally, no apparent distress ABDOMEN: soft, mild diffuse tenderness, no rebound or guarding, bowel sounds noted throughout abdomen GU:no cva tenderness. Chaperone present during exam. No inguinal hernia.  No testicular tenderness.  No scrotal tenderness.  Testicles descended  bilaterally.  No penile discharge or lesions noted.    NEURO: Pt is awake/alert/appropriate, moves all extremitiesx4.  No facial droop.   EXTREMITIES: pulses normal/equal, full ROM SKIN: warm, color normal PSYCH: no abnormalities of mood noted, alert and oriented to situation   ED Course  Procedures  DIAGNOSTIC STUDIES: Oxygen Saturation is 100% on RA, normal by my interpretation.    COORDINATION OF CARE: 2:37 AM Discussed treatment plan with pt at bedside which includes lab work and pt agreed to plan.  pt reports his sexual partner was treated for gonorrhea He reports this is his main issue He did mention CP, but reports this is not a new issue Will empirically tx with rocephin/zithromax Discussed safe sex practices Labs Review Labs Reviewed  COMPREHENSIVE METABOLIC PANEL - Abnormal; Notable for the following:    ALT 11 (*)    All other components within normal limits  LIPASE, BLOOD  CBC  URINALYSIS, ROUTINE W REFLEX MICROSCOPIC ([facility])  GC/CHLAMYDIA PROBE AMP ([place]) NOT [facility]    I have personally reviewed and evaluated these  lab results as part of my medical decision-making.    MDM   Final diagnoses:  Dysuria    Nursing notes including past medical history and social history reviewed and considered in documentation Labs/vital reviewed myself and considered during evaluation   I personally performed the services described in this documentation, which was scribed in my presence. The recorded information has been reviewed and is accurate.       Zadie Rhineonald Jalaina Salyers, MD 11/20/15 [phone]

## 2015-11-20 NOTE — ED Notes (Signed)
Patient Alert and oriented X4. Stable and ambulatory. Patient verbalized understanding of the discharge instructions.  Patient belongings were taken by the patient.  

## 2015-11-22 LAB — GC/CHLAMYDIA PROBE AMP (~~LOC~~) NOT AT ARMC
Chlamydia: NEGATIVE
Neisseria Gonorrhea: NEGATIVE

## 2015-12-15 ENCOUNTER — Encounter (HOSPITAL_COMMUNITY): Payer: Self-pay | Admitting: Emergency Medicine

## 2015-12-15 ENCOUNTER — Emergency Department (HOSPITAL_COMMUNITY)
Admission: EM | Admit: 2015-12-15 | Discharge: 2015-12-15 | Disposition: A | Discharge status: Home / Self Care | Payer: Self-pay | Attending: Emergency Medicine | Admitting: Emergency Medicine

## 2015-12-15 DIAGNOSIS — A64 Unspecified sexually transmitted disease: Secondary | ICD-10-CM | POA: Insufficient documentation

## 2015-12-15 DIAGNOSIS — F17210 Nicotine dependence, cigarettes, uncomplicated: Secondary | ICD-10-CM | POA: Insufficient documentation

## 2015-12-15 LAB — BASIC METABOLIC PANEL
Anion gap: 6 (ref 5–15)
BUN: 9 mg/dL (ref 6–20)
CO2: 25 mmol/L (ref 22–32)
Calcium: 9.3 mg/dL (ref 8.9–10.3)
Chloride: 106 mmol/L (ref 101–111)
Creatinine, Ser: 0.97 mg/dL (ref 0.61–1.24)
GFR calc Af Amer: >60 mL/min (ref >60)
GFR calc non Af Amer: >60 mL/min (ref >60)
Glucose, Bld: 84 mg/dL (ref 65–99)
Potassium: 3.4 mmol/L — ABNORMAL LOW (ref 3.5–5.1)
Sodium: 137 mmol/L (ref 135–145)

## 2015-12-15 LAB — URINALYSIS, ROUTINE W REFLEX MICROSCOPIC
Bilirubin Urine: NEGATIVE
Glucose, UA: NEGATIVE mg/dL
Hgb urine dipstick: NEGATIVE
Ketones, ur: NEGATIVE mg/dL
Leukocytes, UA: NEGATIVE
Nitrite: NEGATIVE
Protein, ur: NEGATIVE mg/dL
Specific Gravity, Urine: 1.015 (ref 1.005–1.030)
pH: 7.5 (ref 5.0–8.0)

## 2015-12-15 LAB — CBC
HCT: 40.3 % (ref 39.0–52.0)
Hemoglobin: 14 g/dL (ref 13.0–17.0)
MCH: 30.4 pg (ref 26.0–34.0)
MCHC: 34.7 g/dL (ref 30.0–36.0)
MCV: 87.6 fL (ref 78.0–100.0)
Platelets: 164 10*3/uL (ref 150–400)
RBC: 4.6 MIL/uL (ref 4.22–5.81)
RDW: 13.6 % (ref 11.5–15.5)
WBC: 5.2 10*3/uL (ref 4.0–10.5)

## 2015-12-15 MED ORDER — STERILE WATER FOR INJECTION IJ SOLN
INTRAMUSCULAR | Status: AC
  Administered 2015-12-15: 21:00:00
  Filled 2015-12-15: qty 10

## 2015-12-15 MED ORDER — CEFTRIAXONE SODIUM 250 MG IJ SOLR
250.0000 mg | Freq: Once | INTRAMUSCULAR | Status: AC
  Administered 2015-12-15: 250 mg via INTRAMUSCULAR
  Filled 2015-12-15: qty 250

## 2015-12-15 MED ORDER — AZITHROMYCIN 1 G PO PACK
1.0000 g | PACK | Freq: Once | ORAL | Status: AC
  Administered 2015-12-15: 1 g via ORAL
  Filled 2015-12-15: qty 1

## 2015-12-15 NOTE — ED Notes (Signed)
Pt states for the last 2 days he has been having right and left sided flank pain. Pt also states burning with urination. Pt states a recent  Sexual partner was treated for an std last week and wishes to be treated also.

## 2015-12-15 NOTE — ED Notes (Signed)
Pt refused to sign or get another set of vs. He stated he needed to leave right now because he was about to miss the bus. Discharge papers discussed

## 2015-12-15 NOTE — Discharge Instructions (Signed)
Sexually Transmitted Disease °A sexually transmitted disease (STD) is a disease or infection that may be passed (transmitted) from person to person, usually during sexual activity. This may happen by way of saliva, semen, blood, vaginal mucus, or urine. Common STDs include: °· Gonorrhea. °· Chlamydia. °· Syphilis. °· HIV and AIDS. °· Genital herpes. °· Hepatitis B and C. °· Trichomonas. °· Human papillomavirus (HPV). °· Pubic lice. °· Scabies. °· Mites. °· Bacterial vaginosis. °WHAT ARE CAUSES OF STDs? °An STD may be caused by bacteria, a virus, or parasites. STDs are often transmitted during sexual activity if one person is infected. However, they may also be transmitted through nonsexual means. STDs may be transmitted after:  °· Sexual intercourse with an infected person. °· Sharing sex toys with an infected person. °· Sharing needles with an infected person or using unclean piercing or tattoo needles. °· Having intimate contact with the genitals, mouth, or rectal areas of an infected person. °· Exposure to infected fluids during birth. °WHAT ARE THE SIGNS AND SYMPTOMS OF STDs? °Different STDs have different symptoms. Some people may not have any symptoms. If symptoms are present, they may include: °· Painful or bloody urination. °· Pain in the pelvis, abdomen, vagina, anus, throat, or eyes. °· A skin rash, itching, or irritation. °· Growths, ulcerations, blisters, or sores in the genital and anal areas. °· Abnormal vaginal discharge with or without bad odor. °· Penile discharge in men. °· Fever. °· Pain or bleeding during sexual intercourse. °· Swollen glands in the groin area. °· Yellow skin and eyes (jaundice). This is seen with hepatitis. °· Swollen testicles. °· Infertility. °· Sores and blisters in the mouth. °HOW ARE STDs DIAGNOSED? °To make a diagnosis, your health care provider may: °· Take a medical history. °· Perform a physical exam. °· Take a sample of any discharge to examine. °· Swab the throat,  cervix, opening to the penis, rectum, or vagina for testing. °· Test a sample of your first morning urine. °· Perform blood tests. °· Perform a Pap test, if this applies. °· Perform a colposcopy. °· Perform a laparoscopy. °HOW ARE STDs TREATED? °Treatment depends on the STD. Some STDs may be treated but not cured. °· Chlamydia, gonorrhea, trichomonas, and syphilis can be cured with antibiotic medicine. °· Genital herpes, hepatitis, and HIV can be treated, but not cured, with prescribed medicines. The medicines lessen symptoms. °· Genital warts from HPV can be treated with medicine or by freezing, burning (electrocautery), or surgery. Warts may come back. °· HPV cannot be cured with medicine or surgery. However, abnormal areas may be removed from the cervix, vagina, or vulva. °· If your diagnosis is confirmed, your recent sexual partners need treatment. This is true even if they are symptom-free or have a negative culture or evaluation. They should not have sex until their health care providers say it is okay. °· Your health care provider may test you for infection again 3 months after treatment. °HOW CAN I REDUCE MY RISK OF GETTING AN STD? °Take these steps to reduce your risk of getting an STD: °· Use latex condoms, dental dams, and water-soluble lubricants during sexual activity. Do not use petroleum jelly or oils. °· Avoid having multiple sex partners. °· Do not have sex with someone who has other sex partners °· Do not have sex with anyone you do not know or who is at high risk for an STD. °· Avoid risky sex practices that can break your skin. °· Do not have sex   if you have open sores on your mouth or skin. °· Avoid drinking too much alcohol or taking illegal drugs. Alcohol and drugs can affect your judgment and put you in a vulnerable position. °· Avoid engaging in oral and anal sex acts. °· Get vaccinated for HPV and hepatitis. If you have not received these vaccines in the past, talk to your health care  provider about whether one or both might be right for you. °· If you are at risk of being infected with HIV, it is recommended that you take a prescription medicine daily to prevent HIV infection. This is called pre-exposure prophylaxis (PrEP). You are considered at risk if: °¨ You are a man who has sex with other men (MSM). °¨ You are a heterosexual man or woman and are sexually active with more than one partner. °¨ You take drugs by injection. °¨ You are sexually active with a partner who has HIV. °· Talk with your health care provider about whether you are at high risk of being infected with HIV. If you choose to begin PrEP, you should first be tested for HIV. You should then be tested every 3 months for as long as you are taking PrEP. °WHAT SHOULD I DO IF I THINK I HAVE AN STD? °· See your health care provider. °· Tell your sexual partner(s). They should be tested and treated for any STDs. °· Do not have sex until your health care provider says it is okay. °WHEN SHOULD I GET IMMEDIATE MEDICAL CARE? °Contact your health care provider right away if:  °· You have severe abdominal pain. °· You are a man and notice swelling or pain in your testicles. °· You are a woman and notice swelling or pain in your vagina. °  °This information is not intended to replace advice given to you by your health care provider. Make sure you discuss any questions you have with your health care provider. °  °Document Released: 08/26/2002 Document Revised: 06/26/2014 Document Reviewed: 12/24/2012 °[employer] Interactive Patient Education ©2016 [employer] ° °
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
## 2015-12-27 ENCOUNTER — Encounter (HOSPITAL_COMMUNITY): Payer: Self-pay

## 2015-12-27 ENCOUNTER — Emergency Department (HOSPITAL_COMMUNITY)
Admission: EM | Admit: 2015-12-27 | Discharge: 2015-12-27 | Disposition: A | Discharge status: Home / Self Care | Payer: Self-pay | Attending: Emergency Medicine | Admitting: Emergency Medicine

## 2015-12-27 DIAGNOSIS — N419 Inflammatory disease of prostate, unspecified: Secondary | ICD-10-CM | POA: Insufficient documentation

## 2015-12-27 DIAGNOSIS — F17210 Nicotine dependence, cigarettes, uncomplicated: Secondary | ICD-10-CM | POA: Insufficient documentation

## 2015-12-27 LAB — CBC WITH DIFFERENTIAL/PLATELET
Basophils Absolute: 0 10*3/uL (ref 0.0–0.1)
Basophils Relative: 1 %
Eosinophils Absolute: 0.1 10*3/uL (ref 0.0–0.7)
Eosinophils Relative: 2 %
HCT: 41.1 % (ref 39.0–52.0)
Hemoglobin: 13.8 g/dL (ref 13.0–17.0)
Lymphocytes Relative: 46 %
Lymphs Abs: 2.1 10*3/uL (ref 0.7–4.0)
MCH: 29.9 pg (ref 26.0–34.0)
MCHC: 33.6 g/dL (ref 30.0–36.0)
MCV: 89.2 fL (ref 78.0–100.0)
Monocytes Absolute: 0.3 10*3/uL (ref 0.1–1.0)
Monocytes Relative: 6 %
Neutro Abs: 2 10*3/uL (ref 1.7–7.7)
Neutrophils Relative %: 45 %
Platelets: 148 10*3/uL — ABNORMAL LOW (ref 150–400)
RBC: 4.61 MIL/uL (ref 4.22–5.81)
RDW: 13.4 % (ref 11.5–15.5)
WBC: 4.5 10*3/uL (ref 4.0–10.5)

## 2015-12-27 LAB — RPR: RPR Ser Ql: NONREACTIVE

## 2015-12-27 LAB — COMPREHENSIVE METABOLIC PANEL
ALT: 12 U/L — ABNORMAL LOW (ref 17–63)
AST: 13 U/L — ABNORMAL LOW (ref 15–41)
Albumin: 3.5 g/dL (ref 3.5–5.0)
Alkaline Phosphatase: 57 U/L (ref 38–126)
Anion gap: 5 (ref 5–15)
BUN: 10 mg/dL (ref 6–20)
CO2: 23 mmol/L (ref 22–32)
Calcium: 8.8 mg/dL — ABNORMAL LOW (ref 8.9–10.3)
Chloride: 112 mmol/L — ABNORMAL HIGH (ref 101–111)
Creatinine, Ser: 1.12 mg/dL (ref 0.61–1.24)
GFR calc Af Amer: >60 mL/min (ref >60)
GFR calc non Af Amer: >60 mL/min (ref >60)
Glucose, Bld: 92 mg/dL (ref 65–99)
Potassium: 4 mmol/L (ref 3.5–5.1)
Sodium: 140 mmol/L (ref 135–145)
Total Bilirubin: 0.6 mg/dL (ref 0.3–1.2)
Total Protein: 6 g/dL — ABNORMAL LOW (ref 6.5–8.1)

## 2015-12-27 LAB — URINALYSIS, ROUTINE W REFLEX MICROSCOPIC
Bilirubin Urine: NEGATIVE
Glucose, UA: NEGATIVE mg/dL
Hgb urine dipstick: NEGATIVE
Ketones, ur: NEGATIVE mg/dL
Leukocytes, UA: NEGATIVE
Nitrite: NEGATIVE
Protein, ur: NEGATIVE mg/dL
Specific Gravity, Urine: 1.02 (ref 1.005–1.030)
pH: 6 (ref 5.0–8.0)

## 2015-12-27 LAB — HIV ANTIBODY (ROUTINE TESTING W REFLEX): HIV Screen 4th Generation wRfx: NONREACTIVE

## 2015-12-27 MED ORDER — KETOROLAC TROMETHAMINE 60 MG/2ML IM SOLN
60.0000 mg | Freq: Once | INTRAMUSCULAR | Status: DC
  Filled 2015-12-27: qty 2

## 2015-12-27 MED ORDER — CIPROFLOXACIN HCL 500 MG PO TABS
500.0000 mg | ORAL_TABLET | Freq: Once | ORAL | Status: AC
  Administered 2015-12-27: 500 mg via ORAL
  Filled 2015-12-27: qty 1

## 2015-12-27 MED ORDER — BUTALBITAL-APAP-CAFFEINE 50-325-40 MG PO TABS
2.0000 | ORAL_TABLET | Freq: Once | ORAL | Status: AC
  Administered 2015-12-27: 2 via ORAL
  Filled 2015-12-27: qty 2

## 2015-12-27 MED ORDER — LIDOCAINE HCL (PF) 1 % IJ SOLN: 2.0000 mL | Freq: Once | INTRAMUSCULAR | Status: DC

## 2015-12-27 MED ORDER — LIDOCAINE HCL (PF) 1 % IJ SOLN
2.0000 mL | Freq: Once | INTRAMUSCULAR | Status: AC
  Administered 2015-12-27: 2 mL via INTRADERMAL

## 2015-12-27 MED ORDER — CIPROFLOXACIN HCL 500 MG PO TABS: 500.0000 mg | ORAL_TABLET | Freq: Two times a day (BID) | ORAL | Status: DC

## 2015-12-27 MED ORDER — CEFTRIAXONE SODIUM 250 MG IJ SOLR
250.0000 mg | Freq: Once | INTRAMUSCULAR | Status: AC
  Administered 2015-12-27: 250 mg via INTRAMUSCULAR
  Filled 2015-12-27: qty 250

## 2015-12-27 MED ORDER — KETOROLAC TROMETHAMINE 15 MG/ML IJ SOLN: 15.0000 mg | Freq: Once | INTRAMUSCULAR | Status: DC

## 2015-12-27 MED ORDER — STERILE WATER FOR INJECTION IJ SOLN
2.0000 mL | Freq: Once | INTRAMUSCULAR | Status: DC
  Filled 2015-12-27: qty 10

## 2015-12-27 NOTE — Discharge Instructions (Signed)
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
## 2015-12-27 NOTE — ED Notes (Signed)
Patient complains of frontal headache x 2 days, denies trauma. Also concerned that he has been exposed to STD and has dysuria x 3 days.

## 2015-12-28 LAB — GC/CHLAMYDIA PROBE AMP (~~LOC~~) NOT AT ARMC
Chlamydia: NEGATIVE
Neisseria Gonorrhea: NEGATIVE

## 2016-02-02 ENCOUNTER — Encounter (HOSPITAL_COMMUNITY): Payer: Self-pay | Admitting: *Deleted

## 2016-02-02 ENCOUNTER — Emergency Department (HOSPITAL_COMMUNITY): Payer: No Typology Code available for payment source

## 2016-02-02 ENCOUNTER — Emergency Department (HOSPITAL_COMMUNITY)
Admission: EM | Admit: 2016-02-02 | Discharge: 2016-02-02 | Disposition: A | Discharge status: Home / Self Care | Payer: No Typology Code available for payment source | Attending: Emergency Medicine | Admitting: Emergency Medicine

## 2016-02-02 DIAGNOSIS — S0181XA Laceration without foreign body of other part of head, initial encounter: Secondary | ICD-10-CM | POA: Insufficient documentation

## 2016-02-02 DIAGNOSIS — S93402A Sprain of unspecified ligament of left ankle, initial encounter: Secondary | ICD-10-CM | POA: Diagnosis not present

## 2016-02-02 DIAGNOSIS — Y939 Activity, unspecified: Secondary | ICD-10-CM | POA: Diagnosis not present

## 2016-02-02 DIAGNOSIS — S60511A Abrasion of right hand, initial encounter: Secondary | ICD-10-CM | POA: Insufficient documentation

## 2016-02-02 DIAGNOSIS — S40012A Contusion of left shoulder, initial encounter: Secondary | ICD-10-CM | POA: Diagnosis not present

## 2016-02-02 DIAGNOSIS — Y999 Unspecified external cause status: Secondary | ICD-10-CM | POA: Insufficient documentation

## 2016-02-02 DIAGNOSIS — Y92410 Unspecified street and highway as the place of occurrence of the external cause: Secondary | ICD-10-CM | POA: Insufficient documentation

## 2016-02-02 DIAGNOSIS — T07XXXA Unspecified multiple injuries, initial encounter: Secondary | ICD-10-CM

## 2016-02-02 DIAGNOSIS — S40812A Abrasion of left upper arm, initial encounter: Secondary | ICD-10-CM | POA: Diagnosis not present

## 2016-02-02 DIAGNOSIS — F17210 Nicotine dependence, cigarettes, uncomplicated: Secondary | ICD-10-CM | POA: Diagnosis not present

## 2016-02-02 DIAGNOSIS — S0993XA Unspecified injury of face, initial encounter: Secondary | ICD-10-CM | POA: Diagnosis present

## 2016-02-02 MED ORDER — OXYCODONE-ACETAMINOPHEN 5-325 MG PO TABS: 1.0000 | ORAL_TABLET | Freq: Four times a day (QID) | ORAL | 0 refills | Status: DC | PRN

## 2016-02-02 MED ORDER — KETOROLAC TROMETHAMINE 60 MG/2ML IM SOLN
30.0000 mg | Freq: Once | INTRAMUSCULAR | Status: AC
  Administered 2016-02-02: 30 mg via INTRAMUSCULAR
  Filled 2016-02-02: qty 2

## 2016-02-02 MED ORDER — OXYCODONE-ACETAMINOPHEN 5-325 MG PO TABS
1.0000 | ORAL_TABLET | Freq: Once | ORAL | Status: AC
  Administered 2016-02-02: 1 via ORAL
  Filled 2016-02-02: qty 1

## 2016-02-02 MED ORDER — NAPROXEN 500 MG PO TABS: 500.0000 mg | ORAL_TABLET | Freq: Two times a day (BID) | ORAL | 0 refills | Status: DC

## 2016-02-02 MED ORDER — METHOCARBAMOL 500 MG PO TABS: 500.0000 mg | ORAL_TABLET | Freq: Two times a day (BID) | ORAL | 0 refills | Status: DC
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
## 2016-04-12 ENCOUNTER — Emergency Department (HOSPITAL_COMMUNITY)
Admission: EM | Admit: 2016-04-12 | Discharge: 2016-04-12 | Disposition: A | Discharge status: Home / Self Care | Payer: Self-pay | Attending: Emergency Medicine | Admitting: Emergency Medicine

## 2016-04-12 ENCOUNTER — Encounter (HOSPITAL_COMMUNITY): Payer: Self-pay | Admitting: *Deleted

## 2016-04-12 DIAGNOSIS — M26622 Arthralgia of left temporomandibular joint: Secondary | ICD-10-CM | POA: Insufficient documentation

## 2016-04-12 DIAGNOSIS — H9202 Otalgia, left ear: Secondary | ICD-10-CM | POA: Insufficient documentation

## 2016-04-12 DIAGNOSIS — F17210 Nicotine dependence, cigarettes, uncomplicated: Secondary | ICD-10-CM | POA: Insufficient documentation

## 2016-04-12 DIAGNOSIS — R6884 Jaw pain: Secondary | ICD-10-CM

## 2016-04-12 MED ORDER — IBUPROFEN 600 MG PO TABS: 600.0000 mg | ORAL_TABLET | Freq: Four times a day (QID) | ORAL | 1 refills | Status: DC | PRN

## 2016-04-12 NOTE — ED Notes (Signed)
See provider notes for assessment 

## 2016-04-12 NOTE — ED Provider Notes (Signed)
[facility] Provider Note   CSN: [id number] Arrival date & time: 04/12/16  1714  By signing my name below, I, Linna DarnerRussell Turner, attest that this documentation has been prepared under the direction and in the presence of Mattie MarlinJessica Beyounce Dickens, PA-C. Electronically Signed: Linna Darnerussell Turner, Scribe. 04/12/2016. 6:02 PM.  History   Chief Complaint Chief Complaint  Patient presents with  . Otalgia    The history is provided by the patient. No language interpreter was used.     HPI Comments: Steve Anderson is a 55 y.o. male with PMHx significant for chronic pain who presents to the Emergency Department complaining of sudden onset, intermittent, 8/10, left ear pain beginning a couple of days ago. He states the pain has been constant today. He notes associated pain in his left jaw and a clicking sensation in his left jaw when he opens his mouth. He denies experiencing similar pain in the past. He notes he has had some cough and rhinorrhea over the last several days as well. Pt denies sore throat, fever, chills, trouble swallowing, congestion, ear drainage, hearing loss, right ear issues, or any other associated symptoms. No PCP.  Past Medical History:  Diagnosis Date  . Chronic pain     Patient Active Problem List   Diagnosis Date Noted  . Dysuria 07/07/2013  . Possible exposure to STD 07/07/2013  . LLQ pain 07/07/2013    Past Surgical History:  Procedure Laterality Date  . ANKLE FRACTURE SURGERY Left   . ORTHOPEDIC SURGERY         Home Medications    Prior to Admission medications   Medication Sig Start Date End Date Taking? Authorizing Provider  ciprofloxacin (CIPRO) 500 MG tablet Take 1 tablet (500 mg total) by mouth 2 (two) times daily. 12/27/15   Tilden FossaElizabeth Rees, MD  ibuprofen (ADVIL,MOTRIN) 600 MG tablet Take 1 tablet (600 mg total) by mouth every 6 (six) hours as needed. 04/12/16   Jerre SimonJessica L Truett Mcfarlan, PA  methocarbamol (ROBAXIN) 500 MG tablet Take 1 tablet (500 mg total) by  mouth 2 (two) times daily. 02/02/16   Ace GinsSerena Y Sam, PA-C  naproxen (NAPROSYN) 500 MG tablet Take 1 tablet (500 mg total) by mouth 2 (two) times daily. 02/02/16   Carlene CoriaSerena Y Sam, PA-C  oxyCODONE-acetaminophen (PERCOCET/ROXICET) 5-325 MG tablet Take 1-2 tablets by mouth every 6 (six) hours as needed for severe pain. 02/02/16   Carlene CoriaSerena Y Sam, PA-C    Family History No family history on file.  Social History Social History  Substance Use Topics  . Smoking status: Current Every Day Smoker    Packs/day: 0.50    Types: Cigarettes  . Smokeless tobacco: Never Used  . Alcohol use 16.8 oz/week    28 Cans of beer per week     Comment: daily     Allergies   Review of patient's allergies indicates no known allergies.   Review of Systems Review of Systems  Constitutional: Negative for chills and fever.  HENT: Positive for ear pain (left) and rhinorrhea. Negative for congestion, ear discharge, hearing loss, sore throat and trouble swallowing.   Respiratory: Positive for cough.     Physical Exam Updated Vital Signs BP 131/78 (BP Location: Left Arm)   Pulse 66   Temp 97.9 F (36.6 C) (Oral)   Resp 16   SpO2 99%   Physical Exam  Constitutional: He appears well-developed and well-nourished. No distress.  HENT:  Head: Normocephalic and atraumatic.  Right Ear: Hearing, tympanic membrane, external ear and  ear canal normal.  Left Ear: Hearing, tympanic membrane, external ear and ear canal normal.  Mouth/Throat: Uvula is midline, oropharynx is clear and moist and mucous membranes are normal.  No edema, deformity, or erythema noted to bilateral jaw. Notable click on the left TMJ noted with opening and closing of patient's mouth. Pt TTP of bilateral jaw, worse on the left.  Eyes: Conjunctivae are normal.  Cardiovascular: Normal rate, regular rhythm, normal heart sounds and intact distal pulses.   Pulmonary/Chest: Effort normal and breath sounds normal. No respiratory distress.  Lungs CTA.    Musculoskeletal: Normal range of motion.  Neurological: He is alert. Coordination normal.  Skin: Skin is warm and dry. He is not diaphoretic.  Psychiatric: He has a normal mood and affect. His behavior is normal.  Nursing note and vitals reviewed.   ED Treatments / Results  Labs (all labs ordered are listed, but only abnormal results are displayed) Labs Reviewed - No data to display  EKG  EKG Interpretation None       Radiology No results found.  Procedures Procedures (including critical care time)  DIAGNOSTIC STUDIES: Oxygen Saturation is 99% on RA, normal by my interpretation.    COORDINATION OF CARE: 6:08 PM Discussed treatment plan with pt at bedside and pt agreed to plan.  Medications Ordered in ED Medications - No data to display   Initial Impression / Assessment and Plan / ED Course  I have reviewed the triage vital signs and the nursing notes.  Pertinent labs & imaging results that were available during my care of the patient were reviewed by me and considered in my medical decision making (see chart for details).  Clinical Course   Patient with presentation concerning for TMJ arthralgia. Patient afebrile, VSS, no acute distress. Examination of bilateral TMs was unremarkable. Discussed ice and pain medication. Instructed patient to follow-up at the [facility] in 3-4 days if symptoms are not improving. Discussed strict return precautions to the ED or if symptoms worsen. Pt is stable for discharge. Patient expressed understanding to the discharge instructions.  I personally performed the services described in this documentation, which was scribed in my presence. The recorded information has been reviewed and is accurate.   Final Clinical Impressions(s) / ED Diagnoses   Final diagnoses:  Jaw pain  Arthralgia of left temporomandibular joint    New Prescriptions Discharge Medication List as of 04/12/2016  6:10 PM     START taking these medications   Details  ibuprofen (ADVIL,MOTRIN) 600 MG tablet Take 1 tablet (600 mg total) by mouth every 6 (six) hours as needed., Starting Wed 04/12/2016, Print         Joyce Copa [place], [place] 04/12/16 1826    Glynn Octave, MD 04/12/16 2326

## 2016-04-12 NOTE — ED Triage Notes (Signed)
Pt is here with left ear pain for about 3-4 days.  No drainage.  No hearing difficulty

## 2016-04-12 NOTE — Discharge Instructions (Signed)
Take the ibuprofen as prescribed and be sure to eat with his medication as it can be hard on your stomach. Ice your jaw as often as you can, 20 minutes on, 20 minutes off. Follow up with the [place] in 3-4 days if your symptoms are not improving. Return immediately to the emergency department if you experience worsening pain, jaw swelling, inability to open or close your mouth, throat swelling, fever, ear discharge or any other concerning symptoms.

## 2016-08-05 ENCOUNTER — Encounter (HOSPITAL_COMMUNITY): Payer: Self-pay | Admitting: Emergency Medicine

## 2016-08-05 ENCOUNTER — Emergency Department (HOSPITAL_COMMUNITY)
Admission: EM | Admit: 2016-08-05 | Discharge: 2016-08-05 | Disposition: A | Discharge status: Home / Self Care | Payer: Self-pay | Attending: Emergency Medicine | Admitting: Emergency Medicine

## 2016-08-05 DIAGNOSIS — F17210 Nicotine dependence, cigarettes, uncomplicated: Secondary | ICD-10-CM | POA: Insufficient documentation

## 2016-08-05 DIAGNOSIS — S0300XD Dislocation of jaw, unspecified side, subsequent encounter: Secondary | ICD-10-CM | POA: Insufficient documentation

## 2016-08-05 DIAGNOSIS — X58XXXD Exposure to other specified factors, subsequent encounter: Secondary | ICD-10-CM | POA: Insufficient documentation

## 2016-08-05 DIAGNOSIS — H9202 Otalgia, left ear: Secondary | ICD-10-CM | POA: Insufficient documentation

## 2016-08-05 DIAGNOSIS — Z79899 Other long term (current) drug therapy: Secondary | ICD-10-CM | POA: Insufficient documentation

## 2016-08-05 MED ORDER — NAPROXEN 500 MG PO TABS: 500.0000 mg | ORAL_TABLET | Freq: Two times a day (BID) | ORAL | 0 refills | Status: DC

## 2016-08-05 MED ORDER — CYCLOBENZAPRINE HCL 10 MG PO TABS: 10.0000 mg | ORAL_TABLET | Freq: Every day | ORAL | 0 refills | Status: DC

## 2016-08-05 NOTE — ED Notes (Signed)
Declined W/C at D/C and was escorted to lobby by RN. 

## 2016-08-05 NOTE — ED Provider Notes (Signed)
[facility] Provider Note    By signing my name below, I, Earmon PhoenixJennifer Waddell, attest that this documentation has been prepared under the direction and in the presence of Sharen Hecklaudia Tres Grzywacz, PA-C. Electronically Signed: Earmon PhoenixJennifer Waddell, ED Scribe. . 10:13 AM.    History   Chief Complaint Chief Complaint  Patient presents with  . Otalgia    The history is provided by the patient and medical records. No language interpreter was used.    Steve Anderson is a 56 y.o. male with PMHx of chronic pain who presents to the Emergency Department complaining of constant left ear pain that began 1-2 weeks ago. He reports associated foreign body sensation of the left ear and states he hears a clicking noise with jaw opening/closing. He has not taken anything for pain relief. Opening and closing the jaw and swallowing increase the pain. He denies alleviating factors. He denies fever, chills, dizziness, light headedness, hearing change, tinnitus, nausea, vomiting, HA, neck pain, difficulty swallowing or breathing, drooling or trismus. He denies recurrent ear infections.   Past Medical History:  Diagnosis Date  . Chronic pain     Patient Active Problem List   Diagnosis Date Noted  . Dysuria 07/07/2013  . Possible exposure to STD 07/07/2013  . LLQ pain 07/07/2013    Past Surgical History:  Procedure Laterality Date  . ANKLE FRACTURE SURGERY Left   . ORTHOPEDIC SURGERY         Home Medications    Prior to Admission medications   Medication Sig Start Date End Date Taking? Authorizing Provider  ciprofloxacin (CIPRO) 500 MG tablet Take 1 tablet (500 mg total) by mouth 2 (two) times daily. 12/27/15   Tilden FossaElizabeth Rees, MD  cyclobenzaprine (FLEXERIL) 10 MG tablet Take 1 tablet (10 mg total) by mouth at bedtime. 08/05/16   Liberty Handylaudia J Flemon Kelty, PA-C  ibuprofen (ADVIL,MOTRIN) 600 MG tablet Take 1 tablet (600 mg total) by mouth every 6 (six) hours as needed. 04/12/16   Jerre SimonJessica L Focht, PA    methocarbamol (ROBAXIN) 500 MG tablet Take 1 tablet (500 mg total) by mouth 2 (two) times daily. 02/02/16   Ace GinsSerena Y Sam, PA-C  naproxen (NAPROSYN) 500 MG tablet Take 1 tablet (500 mg total) by mouth 2 (two) times daily. 08/05/16   Liberty Handylaudia J Brailee Riede, PA-C  oxyCODONE-acetaminophen (PERCOCET/ROXICET) 5-325 MG tablet Take 1-2 tablets by mouth every 6 (six) hours as needed for severe pain. 02/02/16   Carlene CoriaSerena Y Sam, PA-C    Family History No family history on file.  Social History Social History  Substance Use Topics  . Smoking status: Current Every Day Smoker    Packs/day: 0.50    Types: Cigarettes  . Smokeless tobacco: Never Used  . Alcohol use 16.8 oz/week    28 Cans of beer per week     Comment: daily     Allergies   Patient has no known allergies.   Review of Systems Review of Systems  Constitutional: Negative for chills and fever.  HENT: Positive for ear pain. Negative for congestion, drooling, ear discharge, sore throat and tinnitus.   Eyes: Negative for visual disturbance.  Respiratory: Negative for cough, chest tightness and shortness of breath.   Cardiovascular: Negative for chest pain.  Gastrointestinal: Negative for abdominal pain, constipation, diarrhea, nausea and vomiting.  Genitourinary: Negative for decreased urine volume and difficulty urinating.  Musculoskeletal: Negative for arthralgias, joint swelling and neck pain.  Skin: Negative for rash.  Neurological: Negative for dizziness, light-headedness and headaches.  Physical Exam Updated Vital Signs BP 121/70 (BP Location: Right Arm)   Pulse 64   Temp 98 F (36.7 C) (Oral)   Resp 16   Ht 5\' 9"  (1.753 m)   Wt 210 lb (95.3 kg)   SpO2 100%   BMI 31.01 kg/m   Physical Exam  Constitutional: He is oriented to person, place, and time. He appears well-developed and well-nourished. No distress.  NAD.  HENT:  Head: Normocephalic and atraumatic.  Right Ear: Tympanic membrane and external ear normal. No  foreign bodies.  Left Ear: Tympanic membrane and external ear normal. No foreign bodies.  Nose: Nose normal.  Mouth/Throat: Oropharynx is clear and moist. No trismus in the jaw. No oropharyngeal exudate.  Moist mucous membranes.  No nasal mucosa edema. Oropharynx and tonsils pink without erythema, edema, exudates or lesions.  Uvula midline. No trismus.  Clicking of left TM joint. Tenderness to palpation of bilateral preauricular area. Pain at bilateral angles of the mandible. Bilateral masseter pain.  Eyes: Conjunctivae and EOM are normal. Pupils are equal, round, and reactive to light. No scleral icterus.  Neck: Normal range of motion. Neck supple. No JVD present. No tracheal deviation present.  Cardiovascular: Normal rate, regular rhythm, normal heart sounds and intact distal pulses.   No murmur heard. Pulmonary/Chest: Effort normal and breath sounds normal. He has no wheezes.  Abdominal: Soft. He exhibits no distension. There is no tenderness.  Musculoskeletal: Normal range of motion. He exhibits no deformity.  Lymphadenopathy:    He has no cervical adenopathy.  Neurological: He is alert and oriented to person, place, and time.  Skin: Skin is warm and dry. Capillary refill takes less than 2 seconds.  Psychiatric: He has a normal mood and affect. His behavior is normal. Judgment and thought content normal.  Nursing note and vitals reviewed.    ED Treatments / Results  DIAGNOSTIC STUDIES: Oxygen Saturation is 100% on RA, normal by my interpretation.   COORDINATION OF CARE: 10:06 AM- Explained to pt that his left TM joint is causing his pain. Encouraged pt to take OTC Tylenol and Ibuprofen for pain and perform jaw exercises. Return precautions discussed. Pt verbalizes understanding and agrees to plan.  Medications - No data to display  Labs (all labs ordered are listed, but only abnormal results are displayed) Labs Reviewed - No data to display  EKG  EKG Interpretation None         Radiology No results found.  Procedures Procedures (including critical care time)  Medications Ordered in ED Medications - No data to display   Initial Impression / Assessment and Plan / ED Course  I have reviewed the triage vital signs and the nursing notes.  Pertinent labs & imaging results that were available during my care of the patient were reviewed by me and considered in my medical decision making (see chart for details).    Pt presenting with left ear otalgia likely due to TM joint dysfunction. Pt afebrile in NAD. Exam not concerning for mastoiditis, cellulitis, otitis media, otitis externa or malignant OE. VS withi normal limits, no abnormalities in TMs, no foreign bodies in ear canals.  There was left TMJ clicking with jaw opening and closing, bilateral masseter and preauricular tenderness.  Discharge with instructions to perform jaw exercises for TM joint pain. Encouraged pt to take OTC Tylenol and Ibuprofen for pain. Will provide flexeril as patient states he wakes up with a tight jaw and jaw pain which improves with yawning/opening and stretching his  mouth. Return precautions discussed. Pt appears safe for discharge.  I personally performed the services described in this documentation, which was scribed in my presence. The recorded information has been reviewed and is accurate.   Final Clinical Impressions(s) / ED Diagnoses   Final diagnoses:  Dislocation of temporomandibular joint, subsequent encounter  Otalgia of left ear    New Prescriptions Discharge Medication List as of 08/05/2016 10:32 AM    START taking these medications   Details  cyclobenzaprine (FLEXERIL) 10 MG tablet Take 1 tablet (10 mg total) by mouth at bedtime., Starting Sat 08/05/2016, Print         Liberty Handy, PA-C 08/05/16 1153    Doug Sou, MD 08/05/16 [phone]

## 2016-08-05 NOTE — ED Triage Notes (Signed)
Pt. Stated, I've got ear pain or I have something in my ear for the last 2 days.

## 2016-08-05 NOTE — ED Notes (Signed)
Pt ambulated to room from waiting area. Pt had no complaints.  

## 2016-08-05 NOTE — Discharge Instructions (Signed)
Your ear exam was normal today.   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  As we discussed this is arthritis of the jaw joint.    Please take flexeril (muscle relaxer) at bedtime to help with jaw muscle tightening and spasms during sleep.  This medication will cause drowsiness so do not take it before driving or working.  Take naproxen twice a day for the first three days to decrease inflammation in your jaw and the pain.   Please see attached jaw exercises that you should do to help with long term progression of your symptoms.

## 2016-08-20 ENCOUNTER — Encounter (HOSPITAL_COMMUNITY): Payer: Self-pay

## 2016-08-20 ENCOUNTER — Emergency Department (HOSPITAL_COMMUNITY): Payer: Self-pay

## 2016-08-20 ENCOUNTER — Emergency Department (HOSPITAL_COMMUNITY)
Admission: EM | Admit: 2016-08-20 | Discharge: 2016-08-20 | Disposition: A | Discharge status: Home / Self Care | Payer: Self-pay | Attending: Emergency Medicine | Admitting: Emergency Medicine

## 2016-08-20 DIAGNOSIS — F17210 Nicotine dependence, cigarettes, uncomplicated: Secondary | ICD-10-CM | POA: Insufficient documentation

## 2016-08-20 DIAGNOSIS — Y9389 Activity, other specified: Secondary | ICD-10-CM | POA: Insufficient documentation

## 2016-08-20 DIAGNOSIS — X503XXA Overexertion from repetitive movements, initial encounter: Secondary | ICD-10-CM | POA: Insufficient documentation

## 2016-08-20 DIAGNOSIS — Y9289 Other specified places as the place of occurrence of the external cause: Secondary | ICD-10-CM | POA: Insufficient documentation

## 2016-08-20 DIAGNOSIS — Y990 Civilian activity done for income or pay: Secondary | ICD-10-CM | POA: Insufficient documentation

## 2016-08-20 DIAGNOSIS — M79641 Pain in right hand: Secondary | ICD-10-CM | POA: Insufficient documentation

## 2016-08-20 MED ORDER — IBUPROFEN 600 MG PO TABS: 600.0000 mg | ORAL_TABLET | Freq: Four times a day (QID) | ORAL | 0 refills | Status: DC | PRN

## 2016-08-20 MED ORDER — HYDROCODONE-ACETAMINOPHEN 5-325 MG PO TABS
1.0000 | ORAL_TABLET | Freq: Once | ORAL | Status: AC
  Administered 2016-08-20: 1 via ORAL
  Filled 2016-08-20: qty 1

## 2016-08-20 NOTE — Discharge Instructions (Signed)
Ice affected area multiple times throughout the day. Ibuprofen as needed for pain or swelling.  If no improvement in symptoms in 2-3 days, please call the hand surgeon listed to schedule a follow up appointment.  Return to ER for new or worsening symptoms, any additional concerns.   COLD THERAPY DIRECTIONS:  Ice or gel packs can be used to reduce both pain and swelling. Ice is the most helpful within the first 24 to 48 hours after an injury or flareup from overusing a muscle or joint.  Ice is effective, has very few side effects, and is safe for most people to use.   If you expose your skin to cold temperatures for too long or without the proper protection, you can damage your skin or nerves. Watch for signs of skin damage due to cold.   HOME CARE INSTRUCTIONS  Follow these tips to use ice and cold packs safely.  Place a dry or damp towel between the ice and skin. A damp towel will cool the skin more quickly, so you may need to shorten the time that the ice is used.  For a more rapid response, add gentle compression to the ice.  Ice for no more than 10 to 20 minutes at a time. The bonier the area you are icing, the less time it will take to get the benefits of ice.  Check your skin after 5 minutes to make sure there are no signs of a poor response to cold or skin damage.  Rest 20 minutes or more in between uses.  Once your skin is numb, you can end your treatment. You can test numbness by very lightly touching your skin. The touch should be so light that you do not see the skin dimple from the pressure of your fingertip. When using ice, most people will feel these normal sensations in this order: cold, burning, aching, and numbness.

## 2016-08-20 NOTE — ED Notes (Signed)
Declined W/C at D/C and was escorted to lobby by RN. 

## 2016-08-20 NOTE — ED Triage Notes (Signed)
patient complains of right hand and wrist pain x 2 days after punching and twisting same. Pain with any ROM

## 2016-08-20 NOTE — ED Provider Notes (Signed)
[facility] Provider Note   CSN: [id number] Arrival date & time: 08/20/16  [id number]  By signing my name below, I, Rosario AdieWilliam Andrew Hiatt, attest that this documentation has been prepared under the direction and in the presence of [facility] Hospital [facility] NelsonvilleJaime Jae Bruck, PA-C.  Electronically Signed: Rosario AdieWilliam Andrew Hiatt, ED Scribe. 08/20/16. 9:43 AM.  History   Chief Complaint Chief Complaint  Patient presents with  . Hand Injury   The history is provided by the patient. No language interpreter was used.    HPI Comments: Steve Anderson is a right-hand dominant 56 y.o. male who presents to the Emergency Department complaining of persistent, gradually worsening right hand and wrist pain beginning three days ago. He notes associated swelling to the area and notes that his pain is localized to both the volar and palmar surfaces. 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 He recently spent an entire day squeezing a paint spray can and thinks this may be contributory. His pain is exacerbated with making a fist. No noted treatments for his pain were tried prior to coming into the ED. Pt denies focal numbness/weakness, fever, rashes/redness, or any other associated symptoms.   Past Medical History:  Diagnosis Date  . Chronic pain    Patient Active Problem List   Diagnosis Date Noted  . Dysuria 07/07/2013  . Possible exposure to STD 07/07/2013  . LLQ pain 07/07/2013   Past Surgical History:  Procedure Laterality Date  . ANKLE FRACTURE SURGERY Left   . ORTHOPEDIC SURGERY      Home Medications    Prior to Admission medications   Medication Sig Start Date End Date Taking? Authorizing Provider  ciprofloxacin (CIPRO) 500 MG tablet Take 1 tablet (500 mg total) by mouth 2 (two) times daily. 12/27/15   Tilden FossaElizabeth Rees, MD  cyclobenzaprine (FLEXERIL) 10 MG tablet Take 1 tablet (10 mg total) by mouth  at bedtime. 08/05/16   Liberty Handylaudia J Gibbons, PA-C  ibuprofen (ADVIL,MOTRIN) 600 MG tablet Take 1 tablet (600 mg total) by mouth every 6 (six) hours as needed. 04/12/16   Jerre SimonJessica L Focht, PA  methocarbamol (ROBAXIN) 500 MG tablet Take 1 tablet (500 mg total) by mouth 2 (two) times daily. 02/02/16   Ace GinsSerena Y Sam, PA-C  naproxen (NAPROSYN) 500 MG tablet Take 1 tablet (500 mg total) by mouth 2 (two) times daily. 08/05/16   Liberty Handylaudia J Gibbons, PA-C  oxyCODONE-acetaminophen (PERCOCET/ROXICET) 5-325 MG tablet Take 1-2 tablets by mouth every 6 (six) hours as needed for severe pain. 02/02/16   Carlene CoriaSerena Y Sam, PA-C   Family History No family history on file.  Social History Social History  Substance Use Topics  . Smoking status: Current Every Day Smoker    Packs/day: 0.50    Types: Cigarettes  . Smokeless tobacco: Never Used  . Alcohol use 16.8 oz/week    28 Cans of beer per week     Comment: daily   Allergies   Patient has no known allergies.  Review of Systems Review of Systems  Constitutional: Negative for fever.  Musculoskeletal: Positive for arthralgias, joint swelling and myalgias.  Skin: Negative for color change and rash.  Neurological: Negative for weakness and numbness.   Physical Exam Updated Vital Signs BP 140/83   Pulse 80   Temp 98 F (36.7 C)   Resp 18   SpO2 100%   Physical  Exam  Constitutional: He is oriented to person, place, and time. He appears well-developed and well-nourished. No distress.  HENT:  Head: Normocephalic and atraumatic.  Neck: Neck supple.  Cardiovascular: Normal rate, regular rhythm and normal heart sounds.   No murmur heard. Pulmonary/Chest: Effort normal and breath sounds normal. No respiratory distress.  Musculoskeletal:  Diffuse tenderness to palpation of the wrist and metacarpals. No erythema or warmth overlaying the joint. No open wounds. The patient has normal sensation and motor function in the median, ulnar, and radial nerve distributions.  There is no anatomic snuff box tenderness. 2+ radial pulse.  Neurological: He is alert and oriented to person, place, and time.  Skin: Skin is warm and dry.  Nursing note and vitals reviewed.  ED Treatments / Results  DIAGNOSTIC STUDIES: Oxygen Saturation is 100% on RA, normal by my interpretation.   COORDINATION OF CARE: 9:42 AM-Discussed next steps with pt. Pt verbalized understanding and is agreeable with the plan.   Labs (all labs ordered are listed, but only abnormal results are displayed) Labs Reviewed - No data to display  EKG  EKG Interpretation None      Radiology No results found.  Procedures Procedures   Medications Ordered in ED Medications - No data to display  Initial Impression / Assessment and Plan / ED Course  I have reviewed the triage vital signs and the nursing notes.  Pertinent labs & imaging results that were available during my care of the patient were reviewed by me and considered in my medical decision making (see chart for details).    This is a 56yo male without pertinent PMHx who presents into the ED with atraumatic right hand pain. 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 He had recent increase in activity at work with dominant hand and feels this is contributory, I agree.  Patient x-ray negative for obvious fracture, dislocation, or other bony abnormalities. Pain managed in ED. Brace provided in ED. Patient advised to follow up with ortho if no improvement in 2-3 days. Symptomatic home care instructions discussed. Strict return precautions for f/u into the ED were discussed. All questions answered.   Final Clinical Impressions(s) / ED Diagnoses   Final diagnoses:  None   New Prescriptions New Prescriptions   No medications on file   I personally performed the services described in this documentation, which was scribed in my presence. The recorded information has been reviewed and is accurate.    [facility]  Dandy Lazaro, PA-C 08/20/16 1100    Loren Racer, MD 08/22/16 2059

## 2016-12-26 ENCOUNTER — Encounter (HOSPITAL_COMMUNITY): Payer: Self-pay | Admitting: Emergency Medicine

## 2016-12-26 DIAGNOSIS — Z79899 Other long term (current) drug therapy: Secondary | ICD-10-CM | POA: Diagnosis not present

## 2016-12-26 DIAGNOSIS — H9202 Otalgia, left ear: Secondary | ICD-10-CM | POA: Insufficient documentation

## 2016-12-26 DIAGNOSIS — K029 Dental caries, unspecified: Secondary | ICD-10-CM | POA: Insufficient documentation

## 2016-12-26 DIAGNOSIS — F17210 Nicotine dependence, cigarettes, uncomplicated: Secondary | ICD-10-CM | POA: Diagnosis not present

## 2016-12-26 MED ORDER — OXYCODONE-ACETAMINOPHEN 5-325 MG PO TABS
ORAL_TABLET | ORAL | Status: AC
  Filled 2016-12-26: qty 1

## 2016-12-26 MED ORDER — OXYCODONE-ACETAMINOPHEN 5-325 MG PO TABS
1.0000 | ORAL_TABLET | ORAL | Status: DC | PRN
  Administered 2016-12-26: 1 via ORAL

## 2016-12-26 NOTE — ED Triage Notes (Signed)
Reports pain in left ear for 2-3 weeks.  States I don't know if there is anything in there or not.

## 2016-12-27 ENCOUNTER — Emergency Department (HOSPITAL_COMMUNITY)
Admission: EM | Admit: 2016-12-27 | Discharge: 2016-12-27 | Disposition: A | Discharge status: Home / Self Care | Payer: Medicaid Other | Attending: Emergency Medicine | Admitting: Emergency Medicine

## 2016-12-27 DIAGNOSIS — K029 Dental caries, unspecified: Secondary | ICD-10-CM

## 2016-12-27 DIAGNOSIS — H9202 Otalgia, left ear: Secondary | ICD-10-CM

## 2016-12-27 MED ORDER — FLUTICASONE PROPIONATE 50 MCG/ACT NA SUSP: 2.0000 | Freq: Every day | NASAL | 2 refills | Status: DC

## 2016-12-27 MED ORDER — PENICILLIN V POTASSIUM 500 MG PO TABS: 500.0000 mg | ORAL_TABLET | Freq: Three times a day (TID) | ORAL | 0 refills | Status: DC

## 2016-12-27 NOTE — Discharge Instructions (Signed)
1. Medications: flonase, penicillin, usual home medications 2. Treatment: rest, drink plenty of fluids,  3. Follow Up: Please followup with your primary doctor and dentist in 2 days for discussion of your diagnoses and further evaluation after today's visit; if you do not have a primary care doctor use the resource guide provided to find one; Please return to the ER for fevers, rash, vision changes, neck pain or other concerns

## 2016-12-27 NOTE — ED Provider Notes (Signed)
[facility]-EMERGENCY DEPT Provider Note   CSN: [id number] Arrival date & time: 12/26/16  2251     History   Chief Complaint Chief Complaint  Patient presents with  . Otalgia    HPI Steve Anderson is a 56 y.o. male with no major medical hx presents to the Emergency Department complaining of gradual, persistent, progressively worsening left ear pain and associated fullness onset 2 weeks ago.  Pt reports he cleans his ears with q-tips, but does not remember injuring his ear.  Pt denies decreased hearing, dental pain, trismus, vision change, fever, neck pain, chest pain, SOB.  Pt reports cough and congestion last week that has been improving.  Pt reports that he has increased pain with opening his jaw.  He denies dental pain.  Pt reports taking ibuprofen x1 without relief.  .     The history is provided by the patient and medical records. No language interpreter was used.    Past Medical History:  Diagnosis Date  . Chronic pain     Patient Active Problem List   Diagnosis Date Noted  . Dysuria 07/07/2013  . Possible exposure to STD 07/07/2013  . LLQ pain 07/07/2013    Past Surgical History:  Procedure Laterality Date  . ANKLE FRACTURE SURGERY Left   . ORTHOPEDIC SURGERY         Home Medications    Prior to Admission medications   Medication Sig Start Date End Date Taking? Authorizing Provider  ciprofloxacin (CIPRO) 500 MG tablet Take 1 tablet (500 mg total) by mouth 2 (two) times daily. 12/27/15   Tilden Fossaees, Elizabeth, MD  cyclobenzaprine (FLEXERIL) 10 MG tablet Take 1 tablet (10 mg total) by mouth at bedtime. 08/05/16   Liberty HandyGibbons, Claudia J, PA-C  fluticasone (FLONASE) 50 MCG/ACT nasal spray Place 2 sprays into both nostrils daily. 12/27/16   Sariyah Corcino, Dahlia ClientHannah, PA-C  ibuprofen (ADVIL,MOTRIN) 600 MG tablet Take 1 tablet (600 mg total) by mouth every 6 (six) hours as needed. 08/20/16   Ward, Chase PicketJaime Pilcher, PA-C  methocarbamol (ROBAXIN) 500 MG tablet Take 1 tablet (500 mg total) by  mouth 2 (two) times daily. 02/02/16   Sam, Ace GinsSerena Y, PA-C  naproxen (NAPROSYN) 500 MG tablet Take 1 tablet (500 mg total) by mouth 2 (two) times daily. 08/05/16   Liberty HandyGibbons, Claudia J, PA-C  oxyCODONE-acetaminophen (PERCOCET/ROXICET) 5-325 MG tablet Take 1-2 tablets by mouth every 6 (six) hours as needed for severe pain. 02/02/16   Sam, Ace GinsSerena Y, PA-C  penicillin v potassium (VEETID) 500 MG tablet Take 1 tablet (500 mg total) by mouth 3 (three) times daily. 12/27/16   Khaniya Tenaglia, Dahlia ClientHannah, PA-C    Family History No family history on file.  Social History Social History  Substance Use Topics  . Smoking status: Current Every Day Smoker    Packs/day: 0.50    Types: Cigarettes  . Smokeless tobacco: Never Used  . Alcohol use 16.8 oz/week    28 Cans of beer per week     Comment: daily     Allergies   Patient has no known allergies.   Review of Systems Review of Systems  Constitutional: Negative for appetite change, diaphoresis, fatigue, fever and unexpected weight change.  HENT: Positive for ear pain. Negative for mouth sores.   Eyes: Negative for visual disturbance.  Respiratory: Negative for cough, chest tightness, shortness of breath and wheezing.   Cardiovascular: Negative for chest pain.  Gastrointestinal: Negative for abdominal pain, constipation, diarrhea, nausea and vomiting.  Endocrine: Negative for polydipsia, polyphagia  and polyuria.  Genitourinary: Negative for dysuria, frequency, hematuria and urgency.  Musculoskeletal: Negative for back pain and neck stiffness.  Skin: Negative for rash.  Allergic/Immunologic: Negative for immunocompromised state.  Neurological: Negative for syncope, light-headedness and headaches.  Hematological: Does not bruise/bleed easily.  Psychiatric/Behavioral: Negative for sleep disturbance. The patient is not nervous/anxious.      Physical Exam Updated Vital Signs BP 121/71 (BP Location: Left Arm)   Pulse 62   Temp 97.9 F (36.6 C) (Oral)    Resp 16   Ht 5\' 9"  (1.753 m)   Wt 95.7 kg (211 lb)   SpO2 100%   BMI 31.16 kg/m   Physical Exam  Constitutional: He appears well-developed and well-nourished. No distress.  HENT:  Head: Normocephalic and atraumatic.  Right Ear: Tympanic membrane, external ear and ear canal normal. No swelling or tenderness. No foreign bodies. Tympanic membrane is not injected, not perforated, not erythematous, not retracted and not bulging. No middle ear effusion. No hemotympanum.  Left Ear: Tympanic membrane, external ear and ear canal normal. No swelling or tenderness. No foreign bodies. Tympanic membrane is not injected, not perforated, not erythematous, not retracted and not bulging.  No middle ear effusion. No hemotympanum.  Nose: Mucosal edema present. No rhinorrhea. No epistaxis. Right sinus exhibits no maxillary sinus tenderness and no frontal sinus tenderness. Left sinus exhibits no maxillary sinus tenderness and no frontal sinus tenderness.  Mouth/Throat: Uvula is midline and mucous membranes are normal. Mucous membranes are not pale and not cyanotic. Dental caries present. No oropharyngeal exudate, posterior oropharyngeal edema, posterior oropharyngeal erythema or tonsillar abscesses.  Dental carrie of the left upper molar No trismus  Eyes: Conjunctivae are normal. Pupils are equal, round, and reactive to light.  Neck: Normal range of motion and full passive range of motion without pain. No spinous process tenderness and no muscular tenderness present. No neck rigidity. Normal range of motion present. No Brudzinski's sign and no Kernig's sign noted.  Full range of motion No midline or paraspinal tenderness.  No meningeal signs No nuchal rigidity  Cardiovascular: Normal rate and intact distal pulses.   Pulmonary/Chest: Effort normal and breath sounds normal. No stridor.  Clear and equal breath sounds without focal wheezes, rhonchi, rales  Abdominal: Soft. There is no tenderness.    Musculoskeletal: Normal range of motion.  Lymphadenopathy:       Head (right side): No submental, no submandibular, no tonsillar, no preauricular, no posterior auricular and no occipital adenopathy present.       Head (left side): No submental, no submandibular, no tonsillar, no preauricular, no posterior auricular and no occipital adenopathy present.    He has no cervical adenopathy.       Right cervical: No superficial cervical, no deep cervical and no posterior cervical adenopathy present.      Left cervical: No superficial cervical, no deep cervical and no posterior cervical adenopathy present.  Neurological: He is alert.  Skin: Skin is warm and dry. No rash noted. He is not diaphoretic.  No rash to the face, neck, scalp  Psychiatric: He has a normal mood and affect.  Nursing note and vitals reviewed.    ED Treatments / Results   Procedures Procedures (including critical care time)  Medications Ordered in ED Medications  oxyCODONE-acetaminophen (PERCOCET/ROXICET) 5-325 MG per tablet 1 tablet ( Oral Not Given 12/27/16 0218)     Initial Impression / Assessment and Plan / ED Course  I have reviewed the triage vital signs and the nursing  notes.  Pertinent labs & imaging results that were available during my care of the patient were reviewed by me and considered in my medical decision making (see chart for details).     Patient presents with left otalgia. No evidence of otitis media. No cerumen impaction. No vesicular lesions or rash noted to the face, scalp or neck. No lesions noted to the canal. Patient complains of jaw pain however no trismus. No lymphadenopathy. No evidence of meningitis. Patient with some mucosal edema. Patient also with large dental Lyla Son of the left upper molar. Left ear pain may be from radiation of dental pain versus eustachian tube dysfunction. Will try Flonase and penicillin. Patient is to follow with his primary care provider within one week for further  evaluation of his symptoms. He is to return immediately to the emergency department for worsening symptoms including fevers, vision changes, neck pain, swelling, development of rash or other concerns.    Final Clinical Impressions(s) / ED Diagnoses   Final diagnoses:  Otalgia of left ear  Dental caries    New Prescriptions New Prescriptions   FLUTICASONE (FLONASE) 50 MCG/ACT NASAL SPRAY    Place 2 sprays into both nostrils daily.   PENICILLIN V POTASSIUM (VEETID) 500 MG TABLET    Take 1 tablet (500 mg total) by mouth 3 (three) times daily.     Zula Hovsepian, Boyd Kerbs 12/27/16 0401    Ward, Layla Maw, DO 12/27/16 1610

## 2017-06-03 ENCOUNTER — Encounter (HOSPITAL_COMMUNITY): Payer: Self-pay

## 2017-06-03 ENCOUNTER — Emergency Department (HOSPITAL_COMMUNITY)
Admission: EM | Admit: 2017-06-03 | Discharge: 2017-06-03 | Disposition: A | Discharge status: Home / Self Care | Payer: Medicaid Other | Attending: Emergency Medicine | Admitting: Emergency Medicine

## 2017-06-03 ENCOUNTER — Other Ambulatory Visit: Payer: Self-pay

## 2017-06-03 DIAGNOSIS — R51 Headache: Secondary | ICD-10-CM | POA: Insufficient documentation

## 2017-06-03 DIAGNOSIS — R519 Headache, unspecified: Secondary | ICD-10-CM

## 2017-06-03 DIAGNOSIS — H9202 Otalgia, left ear: Secondary | ICD-10-CM | POA: Diagnosis not present

## 2017-06-03 DIAGNOSIS — F17210 Nicotine dependence, cigarettes, uncomplicated: Secondary | ICD-10-CM | POA: Insufficient documentation

## 2017-06-03 MED ORDER — ACETAMINOPHEN 500 MG PO TABS
1000.0000 mg | ORAL_TABLET | Freq: Once | ORAL | Status: AC
  Administered 2017-06-03: 1000 mg via ORAL
  Filled 2017-06-03: qty 2

## 2017-06-03 MED ORDER — CIPROFLOXACIN-DEXAMETHASONE 0.3-0.1 % OT SUSP
4.0000 [drp] | Freq: Once | OTIC | Status: AC
  Administered 2017-06-03: 4 [drp] via OTIC
  Filled 2017-06-03: qty 7.5

## 2017-06-03 NOTE — ED Notes (Signed)
Declined W/C at D/C and was escorted to lobby by RN. 

## 2017-06-03 NOTE — ED Provider Notes (Signed)
[facility] Provider Note   CSN: [id number] Arrival date & time: 06/03/17  [id number]     History   Chief Complaint Chief Complaint  Patient presents with  . Otalgia    HPI Steve Anderson is a 56 y.o. male.  HPI  Patient presents with concern of left ear pain and headache. Seems as though the onset was about 3 weeks ago, since onset symptoms been persistent, with soreness, pain in the left ear, as well as worsening headache. No vision changes, nausea, vomiting, fever, chills, neck pain, weakness in his extremities.  When he has taken no medication for relief. He is specifically worried about something being in his ear.   Past Medical History:  Diagnosis Date  . Chronic pain     Patient Active Problem List   Diagnosis Date Noted  . Dysuria 07/07/2013  . Possible exposure to STD 07/07/2013  . LLQ pain 07/07/2013    Past Surgical History:  Procedure Laterality Date  . ANKLE FRACTURE SURGERY Left   . ORTHOPEDIC SURGERY         Home Medications    Prior to Admission medications   Medication Sig Start Date End Date Taking? Authorizing Provider  ciprofloxacin (CIPRO) 500 MG tablet Take 1 tablet (500 mg total) by mouth 2 (two) times daily. 12/27/15   Tilden Fossaees, Elizabeth, MD  cyclobenzaprine (FLEXERIL) 10 MG tablet Take 1 tablet (10 mg total) by mouth at bedtime. 08/05/16   Liberty HandyGibbons, Claudia J, PA-C  fluticasone (FLONASE) 50 MCG/ACT nasal spray Place 2 sprays into both nostrils daily. 12/27/16   Muthersbaugh, Dahlia ClientHannah, PA-C  ibuprofen (ADVIL,MOTRIN) 600 MG tablet Take 1 tablet (600 mg total) by mouth every 6 (six) hours as needed. 08/20/16   Ward, Chase PicketJaime Pilcher, PA-C  methocarbamol (ROBAXIN) 500 MG tablet Take 1 tablet (500 mg total) by mouth 2 (two) times daily. 02/02/16   Sam, Ace GinsSerena Y, PA-C  naproxen (NAPROSYN) 500 MG tablet Take 1 tablet (500 mg total) by mouth 2 (two) times daily. 08/05/16   Liberty HandyGibbons, Claudia J, PA-C  oxyCODONE-acetaminophen  (PERCOCET/ROXICET) 5-325 MG tablet Take 1-2 tablets by mouth every 6 (six) hours as needed for severe pain. 02/02/16   Sam, Ace GinsSerena Y, PA-C  penicillin v potassium (VEETID) 500 MG tablet Take 1 tablet (500 mg total) by mouth 3 (three) times daily. 12/27/16   Muthersbaugh, Dahlia ClientHannah, PA-C    Family History No family history on file.  Social History Social History   Tobacco Use  . Smoking status: Current Every Day Smoker    Packs/day: 0.50    Types: Cigarettes  . Smokeless tobacco: Never Used  Substance Use Topics  . Alcohol use: Yes    Alcohol/week: 16.8 oz    Types: 28 Cans of beer per week    Comment: daily  . Drug use: No     Allergies   Patient has no known allergies.   Review of Systems Review of Systems  Constitutional:       Per HPI, otherwise negative  HENT:       Per HPI, otherwise negative  Respiratory:       Per HPI, otherwise negative  Cardiovascular:       Per HPI, otherwise negative  Gastrointestinal: Negative for vomiting.  Endocrine:       Negative aside from HPI  Genitourinary:       Neg aside from HPI   Musculoskeletal:       Per HPI, otherwise negative  Skin: Negative.  Neurological: Positive for headaches. Negative for dizziness, syncope, weakness and numbness.     Physical Exam Updated Vital Signs BP 132/78   Pulse 72   Temp 97.9 F (36.6 C) (Oral)   Resp 20   SpO2 100%   Physical Exam  Constitutional: He is oriented to person, place, and time. He appears well-developed. No distress.  HENT:  Head: Normocephalic and atraumatic.  Right Ear: Hearing and tympanic membrane normal.  Left Ear: Hearing normal.  Ears:  Mouth/Throat: Uvula is midline, oropharynx is clear and moist and mucous membranes are normal.  Mild TMJ tenderness bilaterally, no malocclusion  Eyes: Conjunctivae and EOM are normal.  Cardiovascular: Normal rate and regular rhythm.  Pulmonary/Chest: Effort normal. No stridor. No respiratory distress.  Abdominal: He  exhibits no distension.  Musculoskeletal: He exhibits no edema.  Neurological: He is alert and oriented to person, place, and time.  Skin: Skin is warm and dry.  Psychiatric: He has a normal mood and affect.  Nursing note and vitals reviewed.    ED Treatments / Results   Procedures Procedures (including critical care time)  Medications Ordered in ED Medications  acetaminophen (TYLENOL) tablet 1,000 mg (not administered)  ciprofloxacin-dexamethasone (CIPRODEX) 0.3-0.1 % OTIC (EAR) suspension 4 drop (not administered)     Initial Impression / Assessment and Plan / ED Course  I have reviewed the triage vital signs and the nursing notes.  Pertinent labs & imaging results that were available during my care of the patient were reviewed by me and considered in my medical decision making (see chart for details).  Generally well-appearing male presents with ongoing left ear pain, and headache. He is awake, alert, in no distress, moving all extremities spontaneously, has no speech deficit, no facial asymmetry, there is little suspicion for intracranial pathology. Some irritation of the left tympanic membrane, is mild bilateral TMJ tenderness suggest otalgia and/or TMJ syndrome as likely contributors to his pain. Patient provided analgesia here, discharged with outpatient follow-up resources.  Final Clinical Impressions(s) / ED Diagnoses   Final diagnoses:  Left ear pain  Bad headache      Gerhard Munch, MD 06/03/17 [phone]

## 2017-06-03 NOTE — ED Triage Notes (Signed)
Pt states that for the past five days he has been having L ear pain.

## 2017-06-03 NOTE — Discharge Instructions (Signed)
As discussed, your evaluation today has been largely reassuring.  But, it is important that you monitor your condition carefully, and do not hesitate to return to the ED if you develop new, or concerning changes in your condition.  Otherwise, please follow-up with your physician for appropriate ongoing care.  Please use the provided ear medication twice daily for the next 5 days. Please use Tylenol and/or ibuprofen for additional pain control.

## 2017-06-18 ENCOUNTER — Emergency Department (HOSPITAL_COMMUNITY)
Admission: EM | Admit: 2017-06-18 | Discharge: 2017-06-18 | Disposition: A | Discharge status: Home / Self Care | Payer: Medicaid Other | Attending: Emergency Medicine | Admitting: Emergency Medicine

## 2017-06-18 ENCOUNTER — Encounter (HOSPITAL_COMMUNITY): Payer: Self-pay | Admitting: Emergency Medicine

## 2017-06-18 ENCOUNTER — Other Ambulatory Visit: Payer: Self-pay

## 2017-06-18 DIAGNOSIS — Z79899 Other long term (current) drug therapy: Secondary | ICD-10-CM | POA: Diagnosis not present

## 2017-06-18 DIAGNOSIS — F17210 Nicotine dependence, cigarettes, uncomplicated: Secondary | ICD-10-CM | POA: Insufficient documentation

## 2017-06-18 DIAGNOSIS — R300 Dysuria: Secondary | ICD-10-CM | POA: Diagnosis not present

## 2017-06-18 DIAGNOSIS — Z202 Contact with and (suspected) exposure to infections with a predominantly sexual mode of transmission: Secondary | ICD-10-CM | POA: Diagnosis not present

## 2017-06-18 LAB — WET PREP, GENITAL
Clue Cells Wet Prep HPF POC: NONE SEEN
Sperm: NONE SEEN
Trich, Wet Prep: NONE SEEN
Yeast Wet Prep HPF POC: NONE SEEN

## 2017-06-18 LAB — URINALYSIS, ROUTINE W REFLEX MICROSCOPIC
Bilirubin Urine: NEGATIVE
Glucose, UA: NEGATIVE mg/dL
Hgb urine dipstick: NEGATIVE
Ketones, ur: NEGATIVE mg/dL
Leukocytes, UA: NEGATIVE
Nitrite: NEGATIVE
Protein, ur: NEGATIVE mg/dL
Specific Gravity, Urine: 1.014 (ref 1.005–1.030)
pH: 5 (ref 5.0–8.0)

## 2017-06-18 MED ORDER — AZITHROMYCIN 250 MG PO TABS
1000.0000 mg | ORAL_TABLET | Freq: Once | ORAL | Status: AC
  Administered 2017-06-18: 1000 mg via ORAL
  Filled 2017-06-18: qty 4

## 2017-06-18 MED ORDER — LIDOCAINE HCL (PF) 1 % IJ SOLN
INTRAMUSCULAR | Status: AC
  Administered 2017-06-18: 2 mL
  Filled 2017-06-18: qty 5

## 2017-06-18 MED ORDER — CEFTRIAXONE SODIUM 250 MG IJ SOLR
250.0000 mg | Freq: Once | INTRAMUSCULAR | Status: AC
  Administered 2017-06-18: 250 mg via INTRAMUSCULAR
  Filled 2017-06-18: qty 250

## 2017-06-18 NOTE — ED Provider Notes (Signed)
[facility] Provider Note   CSN: [id number] Arrival date & time: 06/18/17  [id number]     History   Chief Complaint Chief Complaint  Patient presents with  . Exposure to STD    HPI Steve Anderson is a 56 y.o. male but no significant past medical history presenting with dysuria with burning on urination for the last 2 days.  Patient has concerns for exposure to STI.  History of unprotected intercourse.  Denies penile discharge, lesions, testicular swelling or pain.  His fever, chills, nausea, vomiting or other symptoms.  He has not tried anything for his symptoms.  Discomfort is usually associated with urination only.  No alleviating factors noted.  HPI  Past Medical History:  Diagnosis Date  . Chronic pain     Patient Active Problem List   Diagnosis Date Noted  . Dysuria 07/07/2013  . Possible exposure to STD 07/07/2013  . LLQ pain 07/07/2013    Past Surgical History:  Procedure Laterality Date  . ANKLE FRACTURE SURGERY Left   . ORTHOPEDIC SURGERY         Home Medications    Prior to Admission medications   Medication Sig Start Date End Date Taking? Authorizing Provider  ciprofloxacin (CIPRO) 500 MG tablet Take 1 tablet (500 mg total) by mouth 2 (two) times daily. 12/27/15   Tilden Fossaees, Elizabeth, MD  cyclobenzaprine (FLEXERIL) 10 MG tablet Take 1 tablet (10 mg total) by mouth at bedtime. 08/05/16   Liberty HandyGibbons, Claudia J, PA-C  fluticasone (FLONASE) 50 MCG/ACT nasal spray Place 2 sprays into both nostrils daily. 12/27/16   Muthersbaugh, Dahlia ClientHannah, PA-C  ibuprofen (ADVIL,MOTRIN) 600 MG tablet Take 1 tablet (600 mg total) by mouth every 6 (six) hours as needed. 08/20/16   Ward, Chase PicketJaime Pilcher, PA-C  methocarbamol (ROBAXIN) 500 MG tablet Take 1 tablet (500 mg total) by mouth 2 (two) times daily. 02/02/16   Sam, Ace GinsSerena Y, PA-C  naproxen (NAPROSYN) 500 MG tablet Take 1 tablet (500 mg total) by mouth 2 (two) times daily. 08/05/16   Liberty HandyGibbons, Claudia J, PA-C    oxyCODONE-acetaminophen (PERCOCET/ROXICET) 5-325 MG tablet Take 1-2 tablets by mouth every 6 (six) hours as needed for severe pain. 02/02/16   Sam, Ace GinsSerena Y, PA-C  penicillin v potassium (VEETID) 500 MG tablet Take 1 tablet (500 mg total) by mouth 3 (three) times daily. 12/27/16   Muthersbaugh, Dahlia ClientHannah, PA-C    Family History No family history on file.  Social History Social History   Tobacco Use  . Smoking status: Current Every Day Smoker    Packs/day: 0.50    Types: Cigarettes  . Smokeless tobacco: Never Used  Substance Use Topics  . Alcohol use: Yes    Alcohol/week: 16.8 oz    Types: 28 Cans of beer per week    Comment: daily  . Drug use: No     Allergies   Patient has no known allergies.   Review of Systems Review of Systems  Constitutional: Negative for chills, diaphoresis, fatigue and fever.  Gastrointestinal: Negative for nausea and vomiting.  Genitourinary: Positive for dysuria, penile pain and testicular pain. Negative for decreased urine volume, difficulty urinating, discharge, flank pain, frequency, hematuria, penile swelling, scrotal swelling and urgency.       Generalized pain and discomfort with urination.  Musculoskeletal: Negative for arthralgias, joint swelling, myalgias, neck pain and neck stiffness.  Skin: Negative for color change, pallor, rash and wound.  Neurological: Negative for dizziness, light-headedness and headaches.  Physical Exam Updated Vital Signs BP (!) 151/81 (BP Location: Right Arm)   Pulse (!) 59   Temp 97.6 F (36.4 C) (Oral)   Resp 18   Ht 5\' 9"  (1.753 m)   Wt 91.2 kg (201 lb)   SpO2 100%   BMI 29.68 kg/m   Physical Exam  Constitutional: He appears well-developed and well-nourished. No distress.  Afebrile, nontoxic-appearing, sitting comfortably in chair in no acute distress.  HENT:  Head: Normocephalic and atraumatic.  Eyes: Conjunctivae and EOM are normal. Right eye exhibits no discharge. Left eye exhibits no  discharge. No scleral icterus.  Neck: Normal range of motion. Neck supple.  Cardiovascular: Normal rate, regular rhythm and normal heart sounds.  No murmur heard. Pulmonary/Chest: Effort normal and breath sounds normal. No stridor. No respiratory distress. He has no wheezes.  Abdominal: He exhibits no distension. There is no tenderness.  Genitourinary: Penis normal. No penile tenderness.  Genitourinary Comments: No obvious discharge from the meatus.  No lesions noted.  No pain to palpation of the scrotum or penis.  No edema.  Musculoskeletal: Normal range of motion. He exhibits no edema.  Neurological: He is alert.  Skin: Skin is warm and dry. No rash noted. He is not diaphoretic. No erythema. No pallor.  Psychiatric: He has a normal mood and affect.  Nursing note and vitals reviewed.    ED Treatments / Results  Labs (all labs ordered are listed, but only abnormal results are displayed) Labs Reviewed  WET PREP, GENITAL - Abnormal; Notable for the following components:      Result Value   WBC, Wet Prep HPF POC FEW (*)    All other components within normal limits  URINALYSIS, ROUTINE W REFLEX MICROSCOPIC  RPR  HIV ANTIBODY (ROUTINE TESTING)  GC/CHLAMYDIA PROBE AMP ([place]) [facility]    EKG  EKG Interpretation None       Radiology No results found.  Procedures Procedures (including critical care time)  Medications Ordered in ED Medications  azithromycin (ZITHROMAX) tablet 1,000 mg (1,000 mg Oral Given 06/18/17 1429)  cefTRIAXone (ROCEPHIN) injection 250 mg (250 mg Intramuscular Given 06/18/17 1429)  lidocaine (PF) (XYLOCAINE) 1 % injection (2 mLs  Given 06/18/17 1430)     Initial Impression / Assessment and Plan / ED Course  I have reviewed the triage vital signs and the nursing notes.  Pertinent labs & imaging results that were available during my care of the patient were reviewed by me and considered in my medical decision making (see chart for  details).    Patient presenting with dysuria and concerns for STI exposure. Patient was treated with 1 dose ceftriaxone and azithromycin while in the emergency department.  UA negative  Discharge home with close follow-up with PCP.  Patient was advised to avoid any sexual activity for at least 7 days after treatment.  He is otherwise well-appearing nontoxic and afebrile.  Discussed strict return precautions and advised to return to the emergency department if experiencing any new or worsening symptoms. Instructions were understood and patient agreed with discharge plan.  Final Clinical Impressions(s) / ED Diagnoses   Final diagnoses:  Possible exposure to STD  Dysuria    ED Discharge Orders    None       Gregary CromerMitchell, Aniylah Avans B, PA-C 06/18/17 1536    Little, Ambrose Finlandachel Morgan, MD 06/19/17 1036

## 2017-06-18 NOTE — ED Notes (Signed)
Pt. Left without his discharge papers

## 2017-06-18 NOTE — ED Triage Notes (Signed)
Pt c/o pain of testicles and penis, + burning with urination. Denies discharge.  Pt states he had unprotected sex Friday, s/s began after encounter. +nausea onset yesterday.

## 2017-06-18 NOTE — ED Notes (Signed)
Pt. Stated, Im here to get me checked out , she did it to me. STD?

## 2017-06-18 NOTE — ED Notes (Signed)
Yellow labels did not work in the room

## 2017-06-18 NOTE — Discharge Instructions (Signed)
As discussed, drink plenty of fluids to keep your urine clear. Avoid sexual intercourse for at least 7 days.  If your cultures return positive you will receive a call and you will have to notify any sexual partners and have them treated as well.  Follow-up with the wellness center in a week. Return sooner if symptoms worsen or new concerning symptoms in the meantime.

## 2017-06-19 LAB — HIV ANTIBODY (ROUTINE TESTING W REFLEX): HIV Screen 4th Generation wRfx: NONREACTIVE

## 2017-06-19 LAB — RPR: RPR Ser Ql: NONREACTIVE

## 2017-06-20 LAB — GC/CHLAMYDIA PROBE AMP (~~LOC~~) NOT AT ARMC
Chlamydia: NEGATIVE
Neisseria Gonorrhea: NEGATIVE

## 2017-06-21 ENCOUNTER — Emergency Department (HOSPITAL_COMMUNITY)
Admission: EM | Admit: 2017-06-21 | Discharge: 2017-06-21 | Disposition: A | Discharge status: Home / Self Care | Payer: Medicaid Other | Attending: Emergency Medicine | Admitting: Emergency Medicine

## 2017-06-21 ENCOUNTER — Encounter (HOSPITAL_COMMUNITY): Payer: Self-pay | Admitting: Emergency Medicine

## 2017-06-21 ENCOUNTER — Emergency Department (HOSPITAL_COMMUNITY): Payer: Medicaid Other

## 2017-06-21 DIAGNOSIS — F17210 Nicotine dependence, cigarettes, uncomplicated: Secondary | ICD-10-CM | POA: Diagnosis not present

## 2017-06-21 DIAGNOSIS — R300 Dysuria: Secondary | ICD-10-CM

## 2017-06-21 DIAGNOSIS — R1030 Lower abdominal pain, unspecified: Secondary | ICD-10-CM | POA: Diagnosis not present

## 2017-06-21 DIAGNOSIS — R109 Unspecified abdominal pain: Secondary | ICD-10-CM | POA: Diagnosis present

## 2017-06-21 LAB — CBC
HCT: 42.9 % (ref 39.0–52.0)
Hemoglobin: 14.9 g/dL (ref 13.0–17.0)
MCH: 30.5 pg (ref 26.0–34.0)
MCHC: 34.7 g/dL (ref 30.0–36.0)
MCV: 87.9 fL (ref 78.0–100.0)
Platelets: 155 10*3/uL (ref 150–400)
RBC: 4.88 MIL/uL (ref 4.22–5.81)
RDW: 13.5 % (ref 11.5–15.5)
WBC: 4.7 10*3/uL (ref 4.0–10.5)

## 2017-06-21 LAB — COMPREHENSIVE METABOLIC PANEL
ALT: 13 U/L — ABNORMAL LOW (ref 17–63)
AST: 18 U/L (ref 15–41)
Albumin: 3.8 g/dL (ref 3.5–5.0)
Alkaline Phosphatase: 55 U/L (ref 38–126)
Anion gap: 5 (ref 5–15)
BUN: 10 mg/dL (ref 6–20)
CO2: 24 mmol/L (ref 22–32)
Calcium: 8.9 mg/dL (ref 8.9–10.3)
Chloride: 107 mmol/L (ref 101–111)
Creatinine, Ser: 0.96 mg/dL (ref 0.61–1.24)
GFR calc Af Amer: >60 mL/min (ref >60)
GFR calc non Af Amer: >60 mL/min (ref >60)
Glucose, Bld: 131 mg/dL — ABNORMAL HIGH (ref 65–99)
Potassium: 3.7 mmol/L (ref 3.5–5.1)
Sodium: 136 mmol/L (ref 135–145)
Total Bilirubin: 0.5 mg/dL (ref 0.3–1.2)
Total Protein: 7 g/dL (ref 6.5–8.1)

## 2017-06-21 LAB — LIPASE, BLOOD: Lipase: 58 U/L — ABNORMAL HIGH (ref 11–51)

## 2017-06-21 MED ORDER — IOPAMIDOL (ISOVUE-300) INJECTION 61%
100.0000 mL | Freq: Once | INTRAVENOUS | Status: AC | PRN
  Administered 2017-06-21: 100 mL via INTRAVENOUS

## 2017-06-21 MED ORDER — IOPAMIDOL (ISOVUE-300) INJECTION 61%
INTRAVENOUS | Status: AC
  Filled 2017-06-21: qty 100

## 2017-06-21 NOTE — ED Triage Notes (Signed)
Patient c/o lower abd pain that radiates around his hip areas for 4-5 days now. Denies any n/v, urinary problems. Reports that bowels have been looser.

## 2017-06-21 NOTE — ED Notes (Signed)
MD at bedside. 

## 2017-06-21 NOTE — ED Notes (Signed)
MD at bedside to reevaluate patient

## 2017-06-21 NOTE — ED Notes (Signed)
Pt transported to CT ?

## 2017-06-21 NOTE — ED Provider Notes (Signed)
[facility]-EMERGENCY DEPT Provider Note   CSN: [id number] Arrival date & time: 06/21/17  4098     History   Chief Complaint Chief Complaint  Patient presents with  . Abdominal Pain    HPI Steve Anderson is a 57 y.o. male.  HPI Patient presents with 5 days of dysuria and lower abdominal pain.  He does states he thinks that it is from a woman that he had unprotected sex with.  No penile discharge.  Has had some dysuria.  Pain in the lower abdomen is dull.  Patient states is the first time he has been seen for it however he was seen at [facility] 3 days ago and evaluated and treated for STD.  Reviewing the records none of his cultures came back positive.  Patient had denied being treated or being seen for this.  No fevers.  No diarrhea or constipation. Past Medical History:  Diagnosis Date  . Chronic pain     Patient Active Problem List   Diagnosis Date Noted  . Dysuria 07/07/2013  . Possible exposure to STD 07/07/2013  . LLQ pain 07/07/2013    Past Surgical History:  Procedure Laterality Date  . ANKLE FRACTURE SURGERY Left   . ORTHOPEDIC SURGERY         Home Medications    Prior to Admission medications   Medication Sig Start Date End Date Taking? Authorizing Provider  fluticasone (FLONASE) 50 MCG/ACT nasal spray Place 2 sprays into both nostrils daily. 12/27/16   Muthersbaugh, Dahlia Client, PA-C  ibuprofen (ADVIL,MOTRIN) 600 MG tablet Take 1 tablet (600 mg total) by mouth every 6 (six) hours as needed. 08/20/16   Ward, Chase Picket, PA-C  methocarbamol (ROBAXIN) 500 MG tablet Take 1 tablet (500 mg total) by mouth 2 (two) times daily. 02/02/16   Sam, Ace Gins, PA-C  naproxen (NAPROSYN) 500 MG tablet Take 1 tablet (500 mg total) by mouth 2 (two) times daily. 08/05/16   Liberty Handy, PA-C  oxyCODONE-acetaminophen (PERCOCET/ROXICET) 5-325 MG tablet Take 1-2 tablets by mouth every 6 (six) hours as needed for severe pain. 02/02/16   Sam, Ace Gins, PA-C    penicillin v potassium (VEETID) 500 MG tablet Take 1 tablet (500 mg total) by mouth 3 (three) times daily. 12/27/16   Muthersbaugh, Dahlia Client, PA-C    Family History No family history on file.  Social History Social History   Tobacco Use  . Smoking status: Current Every Day Smoker    Packs/day: 0.50    Types: Cigarettes  . Smokeless tobacco: Never Used  Substance Use Topics  . Alcohol use: Yes    Alcohol/week: 16.8 oz    Types: 28 Cans of beer per week    Comment: daily  . Drug use: No     Allergies   Patient has no known allergies.   Review of Systems Review of Systems  Constitutional: Negative for appetite change, chills and fever.  HENT: Negative for congestion.   Respiratory: Negative for shortness of breath.   Cardiovascular: Negative for chest pain.  Gastrointestinal: Positive for abdominal pain.  Genitourinary: Positive for dysuria. Negative for discharge, penile pain, penile swelling and scrotal swelling.  Musculoskeletal: Negative for back pain.  Neurological: Negative for numbness.  Psychiatric/Behavioral: Negative for confusion.     Physical Exam Updated Vital Signs BP 140/87 (BP Location: Left Arm)   Pulse (!) 56   Temp 97.8 F (36.6 C) (Oral)   Resp 18   Ht 5\' 9"  (1.753 m)  Wt 90.7 kg (200 lb)   SpO2 100%   BMI 29.53 kg/m   Physical Exam  Constitutional: He appears well-developed.  HENT:  Head: Atraumatic.  Eyes: Pupils are equal, round, and reactive to light.  Cardiovascular: Regular rhythm.  Pulmonary/Chest: Breath sounds normal.  Abdominal: Normal appearance.  Suprapubic tenderness without rebound or guarding. No mass.   Genitourinary: Penis normal. Right testis shows no swelling and no tenderness. Left testis shows no swelling and no tenderness.  Genitourinary Comments: Uncircumcised.  No penile discharge.  Neurological: He is alert.  Skin: Skin is warm. Capillary refill takes less than 2 seconds.  Psychiatric: He has a normal mood  and affect.     ED Treatments / Results  Labs (all labs ordered are listed, but only abnormal results are displayed) Labs Reviewed  LIPASE, BLOOD - Abnormal; Notable for the following components:      Result Value   Lipase 58 (*)    All other components within normal limits  COMPREHENSIVE METABOLIC PANEL - Abnormal; Notable for the following components:   Glucose, Bld 131 (*)    ALT 13 (*)    All other components within normal limits  CBC  URINALYSIS, ROUTINE W REFLEX MICROSCOPIC    EKG  EKG Interpretation None       Radiology Ct Abdomen Pelvis W Contrast  Result Date: 06/21/2017 CLINICAL DATA:  Lower abdominal pain radiating into the hips for 4-5 days. EXAM: CT ABDOMEN AND PELVIS WITH CONTRAST TECHNIQUE: Multidetector CT imaging of the abdomen and pelvis was performed using the standard protocol following bolus administration of intravenous contrast. CONTRAST:  100 ml ISOVUE-300 IOPAMIDOL (ISOVUE-300) INJECTION 61% COMPARISON:  CT abdomen and pelvis 12/19/2014. FINDINGS: Lower chest: Emphysematous change is seen in the lung bases. Dependent atelectasis is also noted. No pleural or pericardial effusion. Hepatobiliary: No focal liver abnormality is seen. No gallstones, gallbladder wall thickening, or biliary dilatation. Pancreas: Unremarkable. No pancreatic ductal dilatation or surrounding inflammatory changes. Spleen: Normal in size without focal abnormality. Adrenals/Urinary Tract: Adrenal glands are unremarkable. Kidneys are normal, without renal calculi, focal lesion, or hydronephrosis. Bladder is unremarkable. Stomach/Bowel: Stomach is within normal limits. Appendix appears normal. No evidence of bowel wall thickening, distention, or inflammatory changes. Vascular/Lymphatic: Aortic atherosclerosis. No enlarged abdominal or pelvic lymph nodes. Reproductive: Prostate is unremarkable. Other:  No fluid collection. Musculoskeletal: No lytic or sclerotic lesion. The patient has bilateral  L5 pars interarticularis defects with 0.4 cm anterolisthesis L5 on S1. IMPRESSION: No acute abnormality or finding to explain the patient's symptoms. Emphysema. Atherosclerosis. 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 Electronically Signed   By: Drusilla Kannerhomas  Dalessio M.D.   On: 06/21/2017 09:21    Procedures Procedures (including critical care time)  Medications Ordered in ED Medications  iopamidol (ISOVUE-300) 61 % injection 100 mL (100 mLs Intravenous Contrast Given 06/21/17 0851)     Initial Impression / Assessment and Plan / ED Course  I have reviewed the triage vital signs and the nursing notes.  Pertinent labs & imaging results that were available during my care of the patient were reviewed by me and considered in my medical decision making (see chart for details).     Patient with abdominal pain and dysuria.  Recently seen for same.  Lab work and cultures negative at that time although he was treated for STDs.  Labs reassuring today.  Patient initially denied being seen in the ER 3 days ago.  CT scan done due to lower  abdominal pain and no clear cause.  It also was reassuring.  Will discharge home to follow-up with urology as needed for the dysuria.  Final Clinical Impressions(s) / ED Diagnoses   Final diagnoses:  Lower abdominal pain  Dysuria    ED Discharge Orders    None       Benjiman Core, MD 06/21/17 1000

## 2017-06-21 NOTE — ED Notes (Signed)
PT returned from CT

## 2018-03-01 ENCOUNTER — Other Ambulatory Visit: Payer: Self-pay

## 2018-03-01 ENCOUNTER — Emergency Department (HOSPITAL_COMMUNITY)
Admission: EM | Admit: 2018-03-01 | Discharge: 2018-03-01 | Disposition: A | Discharge status: Home / Self Care | Payer: Medicaid Other | Attending: Emergency Medicine | Admitting: Emergency Medicine

## 2018-03-01 ENCOUNTER — Encounter (HOSPITAL_COMMUNITY): Payer: Self-pay | Admitting: *Deleted

## 2018-03-01 DIAGNOSIS — F17210 Nicotine dependence, cigarettes, uncomplicated: Secondary | ICD-10-CM | POA: Insufficient documentation

## 2018-03-01 DIAGNOSIS — J3489 Other specified disorders of nose and nasal sinuses: Secondary | ICD-10-CM | POA: Insufficient documentation

## 2018-03-01 DIAGNOSIS — H9203 Otalgia, bilateral: Secondary | ICD-10-CM | POA: Insufficient documentation

## 2018-03-01 DIAGNOSIS — Z79899 Other long term (current) drug therapy: Secondary | ICD-10-CM | POA: Insufficient documentation

## 2018-03-01 MED ORDER — IBUPROFEN 400 MG PO TABS
600.0000 mg | ORAL_TABLET | Freq: Once | ORAL | Status: AC
  Administered 2018-03-01: 600 mg via ORAL
  Filled 2018-03-01: qty 1

## 2018-03-01 NOTE — ED Triage Notes (Signed)
Pt in c/o left earache for the last few days, concerned something is inside of his ear, denies fever

## 2018-03-01 NOTE — ED Provider Notes (Signed)
[facility] EMERGENCY DEPARTMENT Provider Note   CSN: [id number] Arrival date & time: 03/01/18  4098     History   Chief Complaint Chief Complaint  Patient presents with  . Otalgia    HPI Steve Anderson is a 57 y.o. male who presents today for evaluation of bilateral ear pain.  He reports that for the past 2 to 3 days he has had ear pain that started on the left side and then went to the right with associated nasal congestion, postnasal drip, pressure on his forehead and under his eyes bilaterally.  He denies any nausea or vomiting.  No chest pain or shortness of breath.  He does report that he smokes 1 to 2 cigarettes/day.   HPI  Past Medical History:  Diagnosis Date  . Chronic pain     Patient Active Problem List   Diagnosis Date Noted  . Dysuria 07/07/2013  . Possible exposure to STD 07/07/2013  . LLQ pain 07/07/2013    Past Surgical History:  Procedure Laterality Date  . ANKLE FRACTURE SURGERY Left   . ORTHOPEDIC SURGERY          Home Medications    Prior to Admission medications   Medication Sig Start Date End Date Taking? Authorizing Provider  fluticasone (FLONASE) 50 MCG/ACT nasal spray Place 2 sprays into both nostrils daily. Patient not taking: Reported on 06/21/2017 12/27/16   Muthersbaugh, Dahlia Client, PA-C  ibuprofen (ADVIL,MOTRIN) 200 MG tablet Take 600-800 mg by mouth every 6 (six) hours as needed for fever, headache, mild pain, moderate pain or cramping.    [provider]  Multiple Vitamin (MULTIVITAMIN WITH MINERALS) TABS tablet Take 1 tablet by mouth daily.    [provider]    Family History History reviewed. No pertinent family history.  Social History Social History   Tobacco Use  . Smoking status: Current Every Day Smoker    Packs/day: 0.50    Types: Cigarettes  . Smokeless tobacco: Never Used  Substance Use Topics  . Alcohol use: Yes    Alcohol/week: 28.0 standard drinks    Types: 28 Cans of beer  per week    Comment: daily  . Drug use: No     Allergies   Patient has no known allergies.   Review of Systems Review of Systems  Constitutional: Negative for chills and fever.  HENT: Positive for congestion, ear pain, postnasal drip, rhinorrhea and sore throat. Negative for sinus pressure, sinus pain, trouble swallowing and voice change.   Respiratory: Negative for cough, chest tightness and shortness of breath.   Cardiovascular: Negative for chest pain and palpitations.  Musculoskeletal: Negative for neck pain and neck stiffness.  Neurological: Negative for headaches.  All other systems reviewed and are negative.    Physical Exam Updated Vital Signs BP 130/78   Pulse 74   Temp (!) 97.4 F (36.3 C) (Oral)   Resp 18   SpO2 99%   Physical Exam  Constitutional: He appears well-developed and well-nourished. No distress.  HENT:  Head: Normocephalic and atraumatic.  Right Ear: Tympanic membrane, external ear and ear canal normal.  Left Ear: Tympanic membrane, external ear and ear canal normal.  Nose: Mucosal edema and rhinorrhea present. Right sinus exhibits maxillary sinus tenderness and frontal sinus tenderness. Left sinus exhibits maxillary sinus tenderness and frontal sinus tenderness.  Mouth/Throat: Uvula is midline, oropharynx is clear and moist and mucous membranes are normal. No oropharyngeal exudate. No tonsillar exudate.  Eyes: Conjunctivae are normal. No  scleral icterus.  Neck: Normal range of motion. Neck supple.  Cardiovascular: Normal rate, regular rhythm and normal heart sounds.  Pulmonary/Chest: Effort normal and breath sounds normal. No respiratory distress. He has no wheezes.  Lymphadenopathy:    He has no cervical adenopathy.  Neurological: He is alert.  Interactive, communicates appropriately.   Skin: Skin is warm and dry. He is not diaphoretic.  Psychiatric: He has a normal mood and affect. His behavior is normal.  Nursing note and vitals  reviewed.    ED Treatments / Results  Labs (all labs ordered are listed, but only abnormal results are displayed) Labs Reviewed - No data to display  EKG None  Radiology No results found.  Procedures Procedures (including critical care time)  Medications Ordered in ED Medications  ibuprofen (ADVIL,MOTRIN) tablet 600 mg (600 mg Oral Given 03/01/18 1125)     Initial Impression / Assessment and Plan / ED Course  I have reviewed the triage vital signs and the nursing notes.  Pertinent labs & imaging results that were available during my care of the patient were reviewed by me and considered in my medical decision making (see chart for details).     Patients symptoms are consistent with URI, likely viral etiology.  Offered CXR, patient doesn't have a cough, no shortness of breath, therefore not obtained. Discussed that antibiotics are not indicated for viral infections. Pt will be discharged with symptomatic treatment.  Verbalizes understanding and is agreeable with plan. Pt is hemodynamically stable & in NAD prior to dc.   Final Clinical Impressions(s) / ED Diagnoses   Final diagnoses:  Otalgia of both ears  Sinus pressure    ED Discharge Orders    None       Norman ClayHammond, Darshawn Boateng W, PA-C 03/01/18 1128    Arby BarrettePfeiffer, Marcy, MD 03/07/18 [phone]

## 2018-03-01 NOTE — Discharge Instructions (Addendum)
Please take Ibuprofen (Advil, motrin) and Tylenol (acetaminophen) to relieve your pain.  You may take up to 600 MG (3 pills) of normal strength ibuprofen every 8 hours as needed.  In between doses of ibuprofen you make take tylenol, up to 1,000 mg (two extra strength pills).  Do not take more than 3,000 mg tylenol in a 24 hour period.  Please check all medication labels as many medications such as pain and cold medications may contain tylenol.  Do not drink alcohol while taking these medications.  Do not take other NSAID'S while taking ibuprofen (such as aleve or naproxen).  Please take ibuprofen with food to decrease stomach upset.  Please consider taking a daily allergy medication to help with your symptoms.  I suggest a less drowsy 24 hour medication such as allegra, zyrtec or Claritin or the generic version.   Please start using flonase nose spray to help dry up your nasal congestion.    If you develop chest pains, shortness of breath, worsening pain or have any worsening or concerning symptoms please seek additional medical care.

## 2018-05-17 ENCOUNTER — Other Ambulatory Visit: Payer: Self-pay

## 2018-05-17 ENCOUNTER — Encounter (HOSPITAL_COMMUNITY): Payer: Self-pay

## 2018-05-17 ENCOUNTER — Emergency Department (HOSPITAL_COMMUNITY)
Admission: EM | Admit: 2018-05-17 | Discharge: 2018-05-17 | Disposition: A | Discharge status: Home / Self Care | Payer: Medicaid Other | Attending: Emergency Medicine | Admitting: Emergency Medicine

## 2018-05-17 ENCOUNTER — Emergency Department (HOSPITAL_COMMUNITY): Payer: Medicaid Other

## 2018-05-17 DIAGNOSIS — F17210 Nicotine dependence, cigarettes, uncomplicated: Secondary | ICD-10-CM | POA: Insufficient documentation

## 2018-05-17 DIAGNOSIS — R590 Localized enlarged lymph nodes: Secondary | ICD-10-CM | POA: Diagnosis not present

## 2018-05-17 DIAGNOSIS — R0989 Other specified symptoms and signs involving the circulatory and respiratory systems: Secondary | ICD-10-CM

## 2018-05-17 DIAGNOSIS — R070 Pain in throat: Secondary | ICD-10-CM | POA: Insufficient documentation

## 2018-05-17 LAB — CBC WITH DIFFERENTIAL/PLATELET
Abs Immature Granulocytes: 0.01 10*3/uL (ref 0.00–0.07)
Basophils Absolute: 0 10*3/uL (ref 0.0–0.1)
Basophils Relative: 1 %
Eosinophils Absolute: 0.2 10*3/uL (ref 0.0–0.5)
Eosinophils Relative: 3 %
HCT: 44.6 % (ref 39.0–52.0)
Hemoglobin: 15 g/dL (ref 13.0–17.0)
Immature Granulocytes: 0 %
Lymphocytes Relative: 43 %
Lymphs Abs: 2.2 10*3/uL (ref 0.7–4.0)
MCH: 30.3 pg (ref 26.0–34.0)
MCHC: 33.6 g/dL (ref 30.0–36.0)
MCV: 90.1 fL (ref 80.0–100.0)
Monocytes Absolute: 0.4 10*3/uL (ref 0.1–1.0)
Monocytes Relative: 7 %
Neutro Abs: 2.3 10*3/uL (ref 1.7–7.7)
Neutrophils Relative %: 46 %
Platelets: 152 10*3/uL (ref 150–400)
RBC: 4.95 MIL/uL (ref 4.22–5.81)
RDW: 12.6 % (ref 11.5–15.5)
WBC: 5.1 10*3/uL (ref 4.0–10.5)
nRBC: 0 % (ref 0.0–0.2)

## 2018-05-17 LAB — I-STAT CHEM 8, ED
BUN: 14 mg/dL (ref 6–20)
Calcium, Ion: 1.12 mmol/L — ABNORMAL LOW (ref 1.15–1.40)
Chloride: 109 mmol/L (ref 98–111)
Creatinine, Ser: 1 mg/dL (ref 0.61–1.24)
Glucose, Bld: 80 mg/dL (ref 70–99)
HCT: 44 % (ref 39.0–52.0)
Hemoglobin: 15 g/dL (ref 13.0–17.0)
Potassium: 4 mmol/L (ref 3.5–5.1)
Sodium: 139 mmol/L (ref 135–145)
TCO2: 23 mmol/L (ref 22–32)

## 2018-05-17 MED ORDER — IOHEXOL 300 MG/ML  SOLN
75.0000 mL | Freq: Once | INTRAMUSCULAR | Status: AC | PRN
  Administered 2018-05-17: 75 mL via INTRAVENOUS

## 2018-05-17 NOTE — ED Notes (Signed)
Patient transported to CT 

## 2018-05-17 NOTE — ED Provider Notes (Signed)
[facility]GENC[facility] Provider Note   CSN: [id number] Arrival date & time: 05/17/18  0708     History   Chief Complaint Chief Complaint  Patient presents with  . Swallowed Foreign Body    HPI Steve Anderson is a 57 y.o. male.  HPI Patient presents feeling as if something got stuck in his throat.  Happened while eating chicken 2 days ago.  Has had pain in his left throat and feels as if something still stuck in there.  States he is been able to eat otherwise but has pain.  States this has not happened before.  No difficulty breathing.  He did not choke on it.  Feels that it is in the left mid side of his neck  He is a smoker.  No weight loss. Past Medical History:  Diagnosis Date  . Chronic pain     Patient Active Problem List   Diagnosis Date Noted  . Dysuria 07/07/2013  . Possible exposure to STD 07/07/2013  . LLQ pain 07/07/2013    Past Surgical History:  Procedure Laterality Date  . ANKLE FRACTURE SURGERY Left   . ORTHOPEDIC SURGERY          Home Medications    Prior to Admission medications   Medication Sig Start Date End Date Taking? Authorizing Provider  fluticasone (FLONASE) 50 MCG/ACT nasal spray Place 2 sprays into both nostrils daily. Patient not taking: Reported on 06/21/2017 12/27/16   Muthersbaugh, Dahlia ClientHannah, PA-C  ibuprofen (ADVIL,MOTRIN) 200 MG tablet Take 600-800 mg by mouth every 6 (six) hours as needed for fever, headache, mild pain, moderate pain or cramping.    [provider]  Multiple Vitamin (MULTIVITAMIN WITH MINERALS) TABS tablet Take 1 tablet by mouth daily.    [provider]    Family History History reviewed. No pertinent family history.  Social History Social History   Tobacco Use  . Smoking status: Current Every Day Smoker    Packs/day: 0.50    Types: Cigarettes  . Smokeless tobacco: Never Used  Substance Use Topics  . Alcohol use: Yes    Alcohol/week: 28.0 standard drinks   Types: 28 Cans of beer per week    Comment: daily  . Drug use: No     Allergies   Patient has no known allergies.   Review of Systems Review of Systems  Constitutional: Negative for chills.  HENT: Positive for sore throat. Negative for drooling and trouble swallowing.   Eyes: Negative for redness.  Respiratory: Negative for shortness of breath.   Cardiovascular: Negative for chest pain.  Gastrointestinal: Negative for abdominal pain.  Genitourinary: Negative for flank pain.  Musculoskeletal: Negative for back pain.  Neurological: Negative for weakness.     Physical Exam Updated Vital Signs BP 126/80 (BP Location: Right Arm)   Pulse 83   Temp 97.8 F (36.6 C) (Oral)   Ht 5\' 9"  (1.753 m)   Wt 95.3 kg   SpO2 100%   BMI 31.01 kg/m   Physical Exam  Constitutional: He appears well-developed.  HENT:  Minimal posterior pharyngeal erythema.  Evidence of smoking seen.  Eyes: Pupils are equal, round, and reactive to light.  Cardiovascular: Normal rate.  Pulmonary/Chest: Effort normal.  Abdominal: There is no tenderness.  Musculoskeletal: He exhibits no edema.  Lymphadenopathy:    He has cervical adenopathy.  Neurological: He is alert.  Skin: Skin is warm. Capillary refill takes less than 2 seconds.     ED Treatments / Results  Labs (all labs ordered are listed, but only abnormal results are displayed) Labs Reviewed  I-STAT CHEM 8, ED - Abnormal; Notable for the following components:      Result Value   Calcium, Ion 1.12 (*)    All other components within normal limits  CBC WITH DIFFERENTIAL/PLATELET    EKG None  Radiology No results found.  Procedures Procedures (including critical care time)  Medications Ordered in ED Medications - No data to display   Initial Impression / Assessment and Plan / ED Course  I have reviewed the triage vital signs and the nursing notes.  Pertinent labs & imaging results that were available during my care of the patient  were reviewed by me and considered in my medical decision making (see chart for details).     Patient with feeling of food stuck in his throat.  Has not been having difficulty eating just more pain on the left side.  CT scan done and shows some prominent tonsils.  Has had URI symptoms recently and could be the cause.  Does have some palpable lymphadenopathy on his anterior neck.  Reviewing records appears to have a feeling something is been stuck before.  Will have follow-up with ENT since it appears to be more throat as opposed to esophageal.  Final Clinical Impressions(s) / ED Diagnoses   Final diagnoses:  None    ED Discharge Orders    None       Benjiman Core, MD 05/17/18 [phone]

## 2018-05-17 NOTE — ED Triage Notes (Signed)
Pt arrives to ED with complaints of feeling like food is stuck in his throat from breakfast this morning. Pt in no apparent distress at this time, denies difficulty breathing or swallowing. Pt placed in position of comfort with call bell in reach.

## 2018-05-17 NOTE — Discharge Instructions (Signed)
Follow-up with ENT for the feeling of something in your throat and your swollen lymph nodes.

## 2018-05-17 NOTE — ED Notes (Signed)
Patient verbalizes understanding of discharge instructions. Opportunity for questioning and answers were provided. Armband removed by staff, pt discharged from ED ambulatory.   

## 2018-08-22 ENCOUNTER — Other Ambulatory Visit: Payer: Self-pay

## 2018-08-22 ENCOUNTER — Encounter (HOSPITAL_COMMUNITY): Payer: Self-pay | Admitting: Emergency Medicine

## 2018-08-22 ENCOUNTER — Emergency Department (HOSPITAL_COMMUNITY)
Admission: EM | Admit: 2018-08-22 | Discharge: 2018-08-22 | Disposition: A | Discharge status: Home / Self Care | Payer: Medicaid Other | Attending: Emergency Medicine | Admitting: Emergency Medicine

## 2018-08-22 DIAGNOSIS — Z202 Contact with and (suspected) exposure to infections with a predominantly sexual mode of transmission: Secondary | ICD-10-CM | POA: Insufficient documentation

## 2018-08-22 DIAGNOSIS — F17210 Nicotine dependence, cigarettes, uncomplicated: Secondary | ICD-10-CM | POA: Diagnosis not present

## 2018-08-22 DIAGNOSIS — R109 Unspecified abdominal pain: Secondary | ICD-10-CM | POA: Insufficient documentation

## 2018-08-22 LAB — CBC WITH DIFFERENTIAL/PLATELET
Abs Immature Granulocytes: 0.01 10*3/uL (ref 0.00–0.07)
Basophils Absolute: 0 10*3/uL (ref 0.0–0.1)
Basophils Relative: 1 %
Eosinophils Absolute: 0.1 10*3/uL (ref 0.0–0.5)
Eosinophils Relative: 2 %
HCT: 42.4 % (ref 39.0–52.0)
Hemoglobin: 14.2 g/dL (ref 13.0–17.0)
Immature Granulocytes: 0 %
Lymphocytes Relative: 41 %
Lymphs Abs: 1.8 10*3/uL (ref 0.7–4.0)
MCH: 29.8 pg (ref 26.0–34.0)
MCHC: 33.5 g/dL (ref 30.0–36.0)
MCV: 88.9 fL (ref 80.0–100.0)
Monocytes Absolute: 0.4 10*3/uL (ref 0.1–1.0)
Monocytes Relative: 9 %
Neutro Abs: 2.1 10*3/uL (ref 1.7–7.7)
Neutrophils Relative %: 47 %
Platelets: 177 10*3/uL (ref 150–400)
RBC: 4.77 MIL/uL (ref 4.22–5.81)
RDW: 12.6 % (ref 11.5–15.5)
WBC: 4.3 10*3/uL (ref 4.0–10.5)
nRBC: 0 % (ref 0.0–0.2)

## 2018-08-22 LAB — URINALYSIS, ROUTINE W REFLEX MICROSCOPIC
Bilirubin Urine: NEGATIVE
Glucose, UA: NEGATIVE mg/dL
Hgb urine dipstick: NEGATIVE
Ketones, ur: NEGATIVE mg/dL
Leukocytes,Ua: NEGATIVE
Nitrite: NEGATIVE
Protein, ur: NEGATIVE mg/dL
Specific Gravity, Urine: 1.013 (ref 1.005–1.030)
pH: 8 (ref 5.0–8.0)

## 2018-08-22 LAB — COMPREHENSIVE METABOLIC PANEL
ALT: 13 U/L (ref 0–44)
AST: 15 U/L (ref 15–41)
Albumin: 3.3 g/dL — ABNORMAL LOW (ref 3.5–5.0)
Alkaline Phosphatase: 59 U/L (ref 38–126)
Anion gap: 10 (ref 5–15)
BUN: 12 mg/dL (ref 6–20)
CO2: 22 mmol/L (ref 22–32)
Calcium: 9.3 mg/dL (ref 8.9–10.3)
Chloride: 106 mmol/L (ref 98–111)
Creatinine, Ser: 0.99 mg/dL (ref 0.61–1.24)
GFR calc Af Amer: >60 mL/min (ref >60)
GFR calc non Af Amer: >60 mL/min (ref >60)
Glucose, Bld: 105 mg/dL — ABNORMAL HIGH (ref 70–99)
Potassium: 3.8 mmol/L (ref 3.5–5.1)
Sodium: 138 mmol/L (ref 135–145)
Total Bilirubin: 0.9 mg/dL (ref 0.3–1.2)
Total Protein: 6.7 g/dL (ref 6.5–8.1)

## 2018-08-22 LAB — LIPASE, BLOOD: Lipase: 42 U/L (ref 11–51)

## 2018-08-22 MED ORDER — CEFTRIAXONE SODIUM 250 MG IJ SOLR
250.0000 mg | Freq: Once | INTRAMUSCULAR | Status: AC
  Administered 2018-08-22: 250 mg via INTRAMUSCULAR
  Filled 2018-08-22: qty 250

## 2018-08-22 MED ORDER — LIDOCAINE HCL (PF) 1 % IJ SOLN
INTRAMUSCULAR | Status: AC
  Administered 2018-08-22: 1 mL
  Filled 2018-08-22: qty 5

## 2018-08-22 MED ORDER — AZITHROMYCIN 1 G PO PACK
1.0000 g | PACK | Freq: Once | ORAL | Status: AC
  Administered 2018-08-22: 1 g via ORAL
  Filled 2018-08-22: qty 1

## 2018-08-22 NOTE — ED Triage Notes (Addendum)
Pt states he has had 3 days of left flank and groin pain. Hx of STD- 10 plus years ago. He states he thinks he may have STD with unprotected sex. No discharge. Denies hx of kidney stones

## 2018-08-22 NOTE — ED Provider Notes (Signed)
[facility] EMERGENCY DEPARTMENT Provider Note   CSN: [id number] Arrival date & time: 08/22/18  1020    History   Chief Complaint Chief Complaint  Patient presents with  . Flank Pain    HPI Steve Anderson is a 58 y.o. male.     The history is provided by the patient and medical records. No language interpreter was used.  Flank Pain  This is a recurrent problem. The current episode started more than 2 days ago. The problem occurs constantly. The problem has not changed since onset.Associated symptoms include abdominal pain. Pertinent negatives include no chest pain, no headaches and no shortness of breath. Nothing (urination) aggravates the symptoms. Nothing relieves the symptoms. He has tried nothing for the symptoms. The treatment provided no relief.    Past Medical History:  Diagnosis Date  . Chronic pain     Patient Active Problem List   Diagnosis Date Noted  . Dysuria 07/07/2013  . Possible exposure to STD 07/07/2013  . LLQ pain 07/07/2013    Past Surgical History:  Procedure Laterality Date  . ANKLE FRACTURE SURGERY Left   . ORTHOPEDIC SURGERY          Home Medications    Prior to Admission medications   Medication Sig Start Date End Date Taking? Authorizing Provider  fluticasone (FLONASE) 50 MCG/ACT nasal spray Place 2 sprays into both nostrils daily. Patient not taking: Reported on 06/21/2017 12/27/16   Muthersbaugh, Dahlia Client, PA-C  ibuprofen (ADVIL,MOTRIN) 200 MG tablet Take 600-800 mg by mouth every 6 (six) hours as needed for fever, headache, mild pain, moderate pain or cramping.    [provider]  Multiple Vitamin (MULTIVITAMIN WITH MINERALS) TABS tablet Take 1 tablet by mouth daily.    [provider]    Family History No family history on file.  Social History Social History   Tobacco Use  . Smoking status: Current Every Day Smoker    Packs/day: 0.50    Types: Cigarettes  . Smokeless tobacco: Never Used    Substance Use Topics  . Alcohol use: Yes    Alcohol/week: 28.0 standard drinks    Types: 28 Cans of beer per week    Comment: daily  . Drug use: No     Allergies   Patient has no known allergies.   Review of Systems Review of Systems  Constitutional: Negative for chills, diaphoresis, fatigue and fever.  HENT: Negative for congestion and rhinorrhea.   Respiratory: Negative for cough, chest tightness, shortness of breath and wheezing.   Cardiovascular: Negative for chest pain, palpitations and leg swelling.  Gastrointestinal: Positive for abdominal pain. Negative for constipation, diarrhea, nausea and vomiting.  Genitourinary: Positive for dysuria, flank pain and penile pain. Negative for decreased urine volume, discharge, frequency, penile swelling, scrotal swelling, testicular pain and urgency.  Musculoskeletal: Negative for back pain, neck pain and neck stiffness.  Skin: Negative for rash and wound.  Neurological: Negative for light-headedness and headaches.  Psychiatric/Behavioral: Negative for agitation and confusion.  All other systems reviewed and are negative.    Physical Exam Updated Vital Signs There were no vitals taken for this visit.  Physical Exam Vitals signs and nursing note reviewed. Exam conducted with a chaperone present.  Constitutional:      Appearance: He is well-developed.  HENT:     Head: Normocephalic and atraumatic.     Nose: Nose normal. No congestion or rhinorrhea.     Mouth/Throat:     Pharynx: No oropharyngeal exudate  or posterior oropharyngeal erythema.  Eyes:     Conjunctiva/sclera: Conjunctivae normal.     Pupils: Pupils are equal, round, and reactive to light.  Neck:     Musculoskeletal: Neck supple. No muscular tenderness.  Cardiovascular:     Rate and Rhythm: Normal rate and regular rhythm.     Heart sounds: No murmur.  Pulmonary:     Effort: Pulmonary effort is normal. No respiratory distress.     Breath sounds: Normal breath  sounds. No wheezing, rhonchi or rales.  Chest:     Chest wall: No tenderness.  Abdominal:     Palpations: Abdomen is soft. There is no mass.     Tenderness: There is no abdominal tenderness. There is left CVA tenderness. There is no right CVA tenderness, guarding or rebound.     Hernia: No hernia is present. There is no hernia in the right inguinal area or left inguinal area.  Genitourinary:    Penis: Uncircumcised.      Scrotum/Testes: Normal. Cremasteric reflex is present.        Right: Tenderness not present.        Left: Tenderness not present.  Musculoskeletal:        General: Tenderness present.     Right lower leg: No edema.     Left lower leg: No edema.  Skin:    General: Skin is warm and dry.     Capillary Refill: Capillary refill takes less than 2 seconds.     Findings: No rash.  Neurological:     General: No focal deficit present.     Mental Status: He is alert.  Psychiatric:        Mood and Affect: Mood normal.      ED Treatments / Results  Labs (all labs ordered are listed, but only abnormal results are displayed) Labs Reviewed  COMPREHENSIVE METABOLIC PANEL - Abnormal; Notable for the following components:      Result Value   Glucose, Bld 105 (*)    Albumin 3.3 (*)    All other components within normal limits  URINE CULTURE  CBC WITH DIFFERENTIAL/PLATELET  LIPASE, BLOOD  URINALYSIS, ROUTINE W REFLEX MICROSCOPIC  GC/CHLAMYDIA PROBE AMP ([place]) NOT AT [facility]    EKG None  Radiology No results found.  Procedures Procedures (including critical care time)  Medications Ordered in ED Medications  azithromycin (ZITHROMAX) powder 1 g (1 g Oral Given 08/22/18 1310)  cefTRIAXone (ROCEPHIN) injection 250 mg (250 mg Intramuscular Given 08/22/18 1310)  lidocaine (PF) (XYLOCAINE) 1 % injection (1 mL  Given 08/22/18 1310)     Initial Impression / Assessment and Plan / ED Course  I have reviewed the triage vital signs and the nursing notes.  Pertinent labs &  imaging results that were available during my care of the patient were reviewed by me and considered in my medical decision making (see chart for details).        Steve Anderson is a 57 y.o. male with remote history of gonorrhea who presents with left flank pain, left lower quadrant abdominal pain, penile pain, and dysuria.  Patient reports that 10 years ago he had unprotected intercourse with individual and subsequently had a gonorrhea infection.  He reports that he had unprotected intercourse with the same individual on Friday and then the next day started having penile pain and dysuria.  He reports that the pain is also coming from his left flank around towards his abdomen.  He reports this feels the  same as when he had gonorrhea.  He denies any discharge or testicle pain.  No recent trauma.  He denies nausea, vomiting, constipation, or diarrhea.  No fevers or chills.  No other complaints on arrival.  No history of hernias, torsion, or other significant past medical problems.  On exam, patient has left CVA tenderness and left leg tenderness.  No hernia palpated on GU exam with a chaperone.  No testicle tenderness.  Patient is uncircumcised and has no discharge or rashes or ulcers seen on external GU exam.  Lungs clear and chest nontender.  Clinically I am most concerned about pyelonephritis, kidney stone, or STI given his concern for exposure and similar symptoms.  Although he denies history of kidney stones, patient agreed for urinalysis to look for infection and if there is bleeding, will consider work-up for stone.  If urinalysis does not show UTI, will likely treat empirically for STI exposure and have follow-up.  Anticipate reassessment for work-up.  Work-up was overall reassuring.  Given history of similar symptoms, patient will be treated empirically for STI exposure.  Patient given Rocephin and azithromycin and will follow-up with PCP.  Patient understood return precautions and follow-up  instructions.  Patient discharged in good condition.   Final Clinical Impressions(s) / ED Diagnoses   Final diagnoses:  Left flank pain  STD exposure    ED Discharge Orders    None     Clinical Impression: 1. Left flank pain   2. STD exposure     Disposition: Discharge  Condition: Good  I have discussed the results, Dx and Tx plan with the pt(& family if present). He/she/they expressed understanding and agree(s) with the plan. Discharge instructions discussed at great length. Strict return precautions discussed and pt &/or family have verbalized understanding of the instructions. No further questions at time of discharge.    Discharge Medication List as of 08/22/2018 12:53 PM      Follow Up: [facility] [address] [place] [address] [phone] Schedule an appointment as soon as possible for a visit    [facility] EMERGENCY DEPARTMENT [address] [id number] mc [place] [address] [phone]       Azalynn Maxim, Canary Brim, MD 08/22/18 [phone]

## 2018-08-22 NOTE — ED Notes (Signed)
Pt verbalized understanding of d/c instructions and has no further questions, VSS, NAD. VSS. Pt informed of no sexual activity for next 14 days.

## 2018-08-22 NOTE — Discharge Instructions (Signed)
Based on your history of similar symptoms with prior sexual transmitted infection, you were treated with antibiotics for presumed STD.  The tests were sent and you will be called if they are positive.  Please follow-up with your primary doctor and encourage the other partner to get tested and treated.  If any symptoms change or worsen, is return to the nearest emergency department.

## 2018-08-22 NOTE — ED Notes (Signed)
Pt ambulated to restroom. 

## 2018-08-23 LAB — URINE CULTURE: Culture: NO GROWTH

## 2018-08-23 LAB — GC/CHLAMYDIA PROBE AMP (~~LOC~~) NOT AT ARMC
Chlamydia: NEGATIVE
Neisseria Gonorrhea: NEGATIVE

## 2018-12-19 ENCOUNTER — Encounter (HOSPITAL_COMMUNITY): Payer: Self-pay | Admitting: Emergency Medicine

## 2018-12-19 ENCOUNTER — Emergency Department (HOSPITAL_COMMUNITY)
Admission: EM | Admit: 2018-12-19 | Discharge: 2018-12-19 | Disposition: A | Discharge status: Home / Self Care | Payer: Medicaid Other | Attending: Emergency Medicine | Admitting: Emergency Medicine

## 2018-12-19 ENCOUNTER — Other Ambulatory Visit: Payer: Self-pay

## 2018-12-19 DIAGNOSIS — Z113 Encounter for screening for infections with a predominantly sexual mode of transmission: Secondary | ICD-10-CM | POA: Insufficient documentation

## 2018-12-19 DIAGNOSIS — F17210 Nicotine dependence, cigarettes, uncomplicated: Secondary | ICD-10-CM | POA: Diagnosis not present

## 2018-12-19 DIAGNOSIS — R300 Dysuria: Secondary | ICD-10-CM | POA: Insufficient documentation

## 2018-12-19 DIAGNOSIS — Z711 Person with feared health complaint in whom no diagnosis is made: Secondary | ICD-10-CM | POA: Insufficient documentation

## 2018-12-19 DIAGNOSIS — R109 Unspecified abdominal pain: Secondary | ICD-10-CM | POA: Insufficient documentation

## 2018-12-19 DIAGNOSIS — R1030 Lower abdominal pain, unspecified: Secondary | ICD-10-CM | POA: Insufficient documentation

## 2018-12-19 LAB — URINALYSIS, ROUTINE W REFLEX MICROSCOPIC
Bilirubin Urine: NEGATIVE
Glucose, UA: NEGATIVE mg/dL
Hgb urine dipstick: NEGATIVE
Ketones, ur: NEGATIVE mg/dL
Leukocytes,Ua: NEGATIVE
Nitrite: NEGATIVE
Protein, ur: NEGATIVE mg/dL
Specific Gravity, Urine: 1.016 (ref 1.005–1.030)
pH: 5 (ref 5.0–8.0)

## 2018-12-19 MED ORDER — CEFTRIAXONE SODIUM 250 MG IJ SOLR
250.0000 mg | Freq: Once | INTRAMUSCULAR | Status: AC
  Administered 2018-12-19: 250 mg via INTRAMUSCULAR
  Filled 2018-12-19: qty 250

## 2018-12-19 MED ORDER — LIDOCAINE HCL (PF) 1 % IJ SOLN
INTRAMUSCULAR | Status: AC
  Administered 2018-12-19: 08:00:00
  Filled 2018-12-19: qty 5

## 2018-12-19 MED ORDER — AZITHROMYCIN 250 MG PO TABS
1000.0000 mg | ORAL_TABLET | Freq: Once | ORAL | Status: AC
  Administered 2018-12-19: 1000 mg via ORAL
  Filled 2018-12-19: qty 4

## 2018-12-19 NOTE — ED Triage Notes (Signed)
Pt from home c/o abdominal pain 8 on scale 0-10 described as burning pain below the umbilicus starting 3 days ago with tenderness on palpation and pain on urination. Pt. States he feels constipation. Last bowel movement yesterday. Pt. Denies NVD. Pt denies fevers/chills.   Pt. States unprotected sex with a friend. States that is when pain started. Believe he may have an STD

## 2018-12-19 NOTE — Discharge Instructions (Signed)
We will contact you with the results of your remaining lab work when it is available. Return to the ED if you start to have worsening symptoms, increased abdominal pain, develop a fever, testicular swelling.

## 2018-12-19 NOTE — ED Provider Notes (Signed)
[facility]   CSN: [id number] Arrival date & time: 12/19/18  0630    History   Chief Complaint Chief Complaint  Patient presents with  . Abdominal Pain    HPI Steve ScottCalvin O Anderson is a 58 y.o. male with a past medical history of gonorrhea who presents to ED for STD check. Reports having unprotected sexual intercourse with a partner about 3 days ago. He is now having dysuria, lower abdominal pain and burning sensation.  States that this feels similar to the time that he was diagnosed with gonorrhea.  He denies any changes to bowel movements, nausea, vomiting, fevers, chills, rashes, lesions or testicular swelling.     HPI  Past Medical History:  Diagnosis Date  . Chronic pain     Patient Active Problem List   Diagnosis Date Noted  . Dysuria 07/07/2013  . Possible exposure to STD 07/07/2013  . LLQ pain 07/07/2013    Past Surgical History:  Procedure Laterality Date  . ANKLE FRACTURE SURGERY Left   . ORTHOPEDIC SURGERY          Home Medications    Prior to Admission medications   Medication Sig Start Date End Date Taking? Authorizing Provider  ibuprofen (ADVIL,MOTRIN) 200 MG tablet Take 600-800 mg by mouth every 6 (six) hours as needed for fever, headache, mild pain, moderate pain or cramping.   Yes [provider]  Multiple Vitamin (MULTIVITAMIN WITH MINERALS) TABS tablet Take 1 tablet by mouth daily.   Yes [provider]  fluticasone (FLONASE) 50 MCG/ACT nasal spray Place 2 sprays into both nostrils daily. Patient not taking: Reported on 06/21/2017 12/27/16   Muthersbaugh, Boyd KerbsHannah, PA-C    Family History History reviewed. No pertinent family history.  Social History Social History   Tobacco Use  . Smoking status: Current Every Day Smoker    Packs/day: 0.50    Types: Cigarettes  . Smokeless tobacco: Never Used  Substance Use Topics  . Alcohol use: Yes    Alcohol/week: 28.0 standard drinks   Types: 28 Cans of beer per week    Comment: daily  . Drug use: No     Allergies   Patient has no known allergies.   Review of Systems Review of Systems  Constitutional: Negative for appetite change, chills and fever.  HENT: Negative for ear pain, rhinorrhea, sneezing and sore throat.   Eyes: Negative for photophobia and visual disturbance.  Respiratory: Negative for cough, chest tightness, shortness of breath and wheezing.   Cardiovascular: Negative for chest pain and palpitations.  Gastrointestinal: Positive for abdominal pain. Negative for blood in stool, constipation, diarrhea, nausea and vomiting.  Genitourinary: Positive for dysuria. Negative for hematuria and urgency.  Musculoskeletal: Negative for myalgias.  Skin: Negative for rash.  Neurological: Negative for dizziness, weakness and light-headedness.     Physical Exam Updated Vital Signs BP (!) 142/88 (BP Location: Right Arm)   Pulse 71   Temp 97.7 F (36.5 C) (Oral)   Resp 18   Ht 5\' 9"  (1.753 m) Comment: Simultaneous filing. User may not have seen previous data.  Wt 93 kg Comment: Simultaneous filing. User may not have seen previous data.  SpO2 98%   BMI 30.27 kg/m   Physical Exam Vitals signs and nursing note reviewed. Exam conducted with a chaperone present.  Constitutional:      General: He is not in acute distress.    Appearance: He is well-developed.  HENT:     Head: Normocephalic  and atraumatic.     Nose: Nose normal.  Eyes:     General: No scleral icterus.       Left eye: No discharge.     Conjunctiva/sclera: Conjunctivae normal.  Neck:     Musculoskeletal: Normal range of motion and neck supple.  Cardiovascular:     Rate and Rhythm: Normal rate and regular rhythm.     Heart sounds: Normal heart sounds. No murmur. No friction rub. No gallop.   Pulmonary:     Effort: Pulmonary effort is normal. No respiratory distress.     Breath sounds: Normal breath sounds.  Abdominal:     General: Bowel  sounds are normal. There is no distension.     Palpations: Abdomen is soft.     Tenderness: There is no abdominal tenderness. There is no guarding.  Genitourinary:    Penis: Uncircumcised. No tenderness or discharge.      Comments: Normal male genitalia noted. Penis, scrotum, and testicles without swelling, lesions, rashes, or tenderness present. No penile discharge noted. Cremasteric reflex intact. RN served as Producer, television/film/video during the exam.  Musculoskeletal: Normal range of motion.  Skin:    General: Skin is warm and dry.     Findings: No rash.  Neurological:     Mental Status: He is alert.     Motor: No abnormal muscle tone.     Coordination: Coordination normal.      ED Treatments / Results  Labs (all labs ordered are listed, but only abnormal results are displayed) Labs Reviewed  URINALYSIS, ROUTINE W REFLEX MICROSCOPIC  GC/CHLAMYDIA PROBE AMP ([place]) NOT AT [facility]    EKG None  Radiology No results found.  Procedures Procedures (including critical care time)  Medications Ordered in ED Medications  cefTRIAXone (ROCEPHIN) injection 250 mg (250 mg Intramuscular Given 12/19/18 0808)  azithromycin (ZITHROMAX) tablet 1,000 mg (1,000 mg Oral Given 12/19/18 0808)  lidocaine (PF) (XYLOCAINE) 1 % injection (  Given 12/19/18 0820)     Initial Impression / Assessment and Plan / ED Course  I have reviewed the triage vital signs and the nursing notes.  Pertinent labs & imaging results that were available during my care of the patient were reviewed by me and considered in my medical decision making (see chart for details).        58 year old male presents to ED for dysuria and lower abdominal pain.  States that symptoms feel similar to the last time he was diagnosed with gonorrhea.  He did have unprotected sexual intercourse about 3 days ago before symptoms began.  GU exam without abnormalities or lesions.  Vital signs are within normal limits.  UA negative.  Patient treated  prophylactically for gonorrhea and chlamydia and advised to follow-up with results of lab work when it is available.  Patient advised to return for worsening symptoms.  Patient is hemodynamically stable, in NAD, and able to ambulate in the ED. Evaluation does not show pathology that would require ongoing emergent intervention or inpatient treatment. I explained the diagnosis to the patient. Pain has been managed and has no complaints prior to discharge. Patient is comfortable with above plan and is stable for discharge at this time. All questions were answered prior to disposition. Strict return precautions for returning to the ED were discussed. Encouraged follow up with PCP.   An After Visit Summary was printed and given to the patient.   Portions of this note were generated with [employer]. Dictation errors may occur despite best attempts  at proofreading.   Final Clinical Impressions(s) / ED Diagnoses   Final diagnoses:  Concern about STD in male without diagnosis    ED Discharge Orders    None       Dietrich PatesKhatri, Naiara Lombardozzi, PA-C 12/19/18 0848    Lorre NickAllen, Anthony, MD 12/22/18 [phone]

## 2018-12-28 ENCOUNTER — Encounter (HOSPITAL_COMMUNITY): Payer: Self-pay | Admitting: *Deleted

## 2018-12-28 ENCOUNTER — Other Ambulatory Visit: Payer: Self-pay

## 2018-12-28 ENCOUNTER — Emergency Department (HOSPITAL_COMMUNITY)
Admission: EM | Admit: 2018-12-28 | Discharge: 2018-12-28 | Disposition: A | Discharge status: Home / Self Care | Payer: Medicaid Other | Attending: Emergency Medicine | Admitting: Emergency Medicine

## 2018-12-28 DIAGNOSIS — N4889 Other specified disorders of penis: Secondary | ICD-10-CM | POA: Diagnosis present

## 2018-12-28 DIAGNOSIS — F17210 Nicotine dependence, cigarettes, uncomplicated: Secondary | ICD-10-CM | POA: Insufficient documentation

## 2018-12-28 DIAGNOSIS — Z79899 Other long term (current) drug therapy: Secondary | ICD-10-CM | POA: Insufficient documentation

## 2018-12-28 DIAGNOSIS — Z202 Contact with and (suspected) exposure to infections with a predominantly sexual mode of transmission: Secondary | ICD-10-CM | POA: Insufficient documentation

## 2018-12-28 MED ORDER — AZITHROMYCIN 250 MG PO TABS
1000.0000 mg | ORAL_TABLET | Freq: Once | ORAL | Status: AC
  Administered 2018-12-28: 11:00:00 1000 mg via ORAL
  Filled 2018-12-28: qty 4

## 2018-12-28 MED ORDER — STERILE WATER FOR INJECTION IJ SOLN
INTRAMUSCULAR | Status: AC
  Administered 2018-12-28: 10 mL
  Filled 2018-12-28: qty 10

## 2018-12-28 MED ORDER — CEFTRIAXONE SODIUM 250 MG IJ SOLR
250.0000 mg | Freq: Once | INTRAMUSCULAR | Status: AC
  Administered 2018-12-28: 250 mg via INTRAMUSCULAR
  Filled 2018-12-28: qty 250

## 2018-12-28 NOTE — ED Notes (Signed)
Pt sts that he is unable to provide urine at this time, PA aware

## 2018-12-28 NOTE — ED Provider Notes (Addendum)
[facility]-EMERGENCY DEPT Provider Note   CSN: [id number] Arrival date & time: 12/28/18  [id number]     History   Chief Complaint Chief Complaint  Patient presents with  . SEXUALLY TRANSMITTED DISEASE    HPI Steve Anderson is a 58 y.o. male who presents for a STD check. When asked why he is here he states "look it up". He states that he has had "burning" in his privates for the past couple days. He has had unprotected sex recently. He was here for the same thing last week and was treated empirically for gonorrhea and chlamydia. Pt is very angry and oppositional during history and exam limiting my history. He states "give me the shot and pill - I need to go to work"     HPI  Past Medical History:  Diagnosis Date  . Chronic pain     Patient Active Problem List   Diagnosis Date Noted  . Dysuria 07/07/2013  . Possible exposure to STD 07/07/2013  . LLQ pain 07/07/2013    Past Surgical History:  Procedure Laterality Date  . ANKLE FRACTURE SURGERY Left   . ORTHOPEDIC SURGERY          Home Medications    Prior to Admission medications   Medication Sig Start Date End Date Taking? Authorizing Provider  fluticasone (FLONASE) 50 MCG/ACT nasal spray Place 2 sprays into both nostrils daily. Patient not taking: Reported on 06/21/2017 12/27/16   Muthersbaugh, Dahlia ClientHannah, PA-C  ibuprofen (ADVIL,MOTRIN) 200 MG tablet Take 600-800 mg by mouth every 6 (six) hours as needed for fever, headache, mild pain, moderate pain or cramping.    [provider]  Multiple Vitamin (MULTIVITAMIN WITH MINERALS) TABS tablet Take 1 tablet by mouth daily.    [provider]    Family History No family history on file.  Social History Social History   Tobacco Use  . Smoking status: Current Every Day Smoker    Packs/day: 0.50    Types: Cigarettes  . Smokeless tobacco: Never Used  Substance Use Topics  . Alcohol use: Yes    Alcohol/week: 28.0 standard drinks   Types: 28 Cans of beer per week    Comment: daily  . Drug use: No     Allergies   Patient has no known allergies.   Review of Systems Review of Systems  Gastrointestinal: Positive for abdominal pain.  Genitourinary: Negative for dysuria and testicular pain.     Physical Exam Updated Vital Signs BP 137/80 (BP Location: Left Arm)   Pulse 64   Temp 97.6 F (36.4 C) (Oral)   Resp 18   Ht 5\' 9"  (1.753 m)   Wt 109.1 kg   SpO2 98%   BMI 35.52 kg/m   Physical Exam Vitals signs and nursing note reviewed.  Constitutional:      General: He is not in acute distress.    Appearance: Normal appearance. He is well-developed. He is not ill-appearing.  HENT:     Head: Normocephalic and atraumatic.  Eyes:     General: No scleral icterus.       Right eye: No discharge.        Left eye: No discharge.     Conjunctiva/sclera: Conjunctivae normal.     Pupils: Pupils are equal, round, and reactive to light.  Neck:     Musculoskeletal: Normal range of motion.  Cardiovascular:     Rate and Rhythm: Normal rate.  Pulmonary:     Effort: Pulmonary effort is  normal. No respiratory distress.  Abdominal:     General: There is no distension.  Skin:    General: Skin is warm and dry.  Neurological:     Mental Status: He is alert and oriented to person, place, and time.  Psychiatric:        Mood and Affect: Affect is angry.        Behavior: Behavior normal.      ED Treatments / Results  Labs (all labs ordered are listed, but only abnormal results are displayed) Labs Reviewed  URINALYSIS, ROUTINE W REFLEX MICROSCOPIC  GC/CHLAMYDIA PROBE AMP ([place]) NOT AT [facility]    EKG None  Radiology No results found.  Procedures Procedures (including critical care time)  Medications Ordered in ED Medications - No data to display   Initial Impression / Assessment and Plan / ED Course  I have reviewed the triage vital signs and the nursing notes.  Pertinent labs & imaging results  that were available during my care of the patient were reviewed by me and considered in my medical decision making (see chart for details).  58 year old male presents for treatment for STD. He states that he has tingling in his private area. He was treated for the same last week. He is refusing testing and states he wants treatment and to go back to work. He was told to go to the health dept for this issue in the future and he states "I sure will"  Final Clinical Impressions(s) / ED Diagnoses   Final diagnoses:  Possible exposure to STD    ED Discharge Orders    None           Recardo Evangelist, PA-C 12/28/18 [place]    Carmin Muskrat, MD 12/29/18 [phone]

## 2018-12-28 NOTE — Discharge Instructions (Addendum)
Go to the health department for future STD checks

## 2018-12-28 NOTE — ED Notes (Addendum)
Pt given medication and d/c but will stay in room for 20 minutes to make sure there is no reaction to medication. Pt is A&O and in NAD

## 2018-12-28 NOTE — ED Triage Notes (Signed)
States he got mixed up with his ex, for 3-4 days "burning in privates", no reported discharge

## 2019-01-15 ENCOUNTER — Other Ambulatory Visit: Payer: Self-pay

## 2019-01-15 ENCOUNTER — Emergency Department (HOSPITAL_COMMUNITY)
Admission: EM | Admit: 2019-01-15 | Discharge: 2019-01-15 | Disposition: A | Discharge status: Home / Self Care | Payer: Medicaid Other | Attending: Emergency Medicine | Admitting: Emergency Medicine

## 2019-01-15 ENCOUNTER — Encounter (HOSPITAL_COMMUNITY): Payer: Self-pay | Admitting: Emergency Medicine

## 2019-01-15 DIAGNOSIS — R319 Hematuria, unspecified: Secondary | ICD-10-CM | POA: Diagnosis not present

## 2019-01-15 DIAGNOSIS — Z113 Encounter for screening for infections with a predominantly sexual mode of transmission: Secondary | ICD-10-CM | POA: Diagnosis not present

## 2019-01-15 DIAGNOSIS — Z79899 Other long term (current) drug therapy: Secondary | ICD-10-CM | POA: Diagnosis not present

## 2019-01-15 DIAGNOSIS — Z711 Person with feared health complaint in whom no diagnosis is made: Secondary | ICD-10-CM

## 2019-01-15 DIAGNOSIS — R1030 Lower abdominal pain, unspecified: Secondary | ICD-10-CM | POA: Diagnosis present

## 2019-01-15 DIAGNOSIS — R300 Dysuria: Secondary | ICD-10-CM | POA: Insufficient documentation

## 2019-01-15 DIAGNOSIS — F17210 Nicotine dependence, cigarettes, uncomplicated: Secondary | ICD-10-CM | POA: Diagnosis not present

## 2019-01-15 LAB — BASIC METABOLIC PANEL
Anion gap: 8 (ref 5–15)
BUN: 9 mg/dL (ref 6–20)
CO2: 25 mmol/L (ref 22–32)
Calcium: 8.7 mg/dL — ABNORMAL LOW (ref 8.9–10.3)
Chloride: 106 mmol/L (ref 98–111)
Creatinine, Ser: 1.04 mg/dL (ref 0.61–1.24)
GFR calc Af Amer: >60 mL/min (ref >60)
GFR calc non Af Amer: >60 mL/min (ref >60)
Glucose, Bld: 101 mg/dL — ABNORMAL HIGH (ref 70–99)
Potassium: 3.7 mmol/L (ref 3.5–5.1)
Sodium: 139 mmol/L (ref 135–145)

## 2019-01-15 LAB — URINALYSIS, ROUTINE W REFLEX MICROSCOPIC
Bacteria, UA: NONE SEEN
Bilirubin Urine: NEGATIVE
Glucose, UA: NEGATIVE mg/dL
Ketones, ur: NEGATIVE mg/dL
Leukocytes,Ua: NEGATIVE
Nitrite: NEGATIVE
Protein, ur: NEGATIVE mg/dL
Specific Gravity, Urine: 1.008 (ref 1.005–1.030)
pH: 6 (ref 5.0–8.0)

## 2019-01-15 LAB — CBC
HCT: 45.7 % (ref 39.0–52.0)
Hemoglobin: 15.2 g/dL (ref 13.0–17.0)
MCH: 30.4 pg (ref 26.0–34.0)
MCHC: 33.3 g/dL (ref 30.0–36.0)
MCV: 91.4 fL (ref 80.0–100.0)
Platelets: 157 10*3/uL (ref 150–400)
RBC: 5 MIL/uL (ref 4.22–5.81)
RDW: 14 % (ref 11.5–15.5)
WBC: 4.8 10*3/uL (ref 4.0–10.5)
nRBC: 0 % (ref 0.0–0.2)

## 2019-01-15 MED ORDER — AZITHROMYCIN 250 MG PO TABS
1000.0000 mg | ORAL_TABLET | Freq: Once | ORAL | Status: AC
  Administered 2019-01-15: 16:00:00 1000 mg via ORAL
  Filled 2019-01-15: qty 4

## 2019-01-15 MED ORDER — ONDANSETRON 8 MG PO TBDP
8.0000 mg | ORAL_TABLET | Freq: Once | ORAL | Status: AC
  Administered 2019-01-15: 16:00:00 8 mg via ORAL
  Filled 2019-01-15: qty 1

## 2019-01-15 MED ORDER — SODIUM CHLORIDE 0.9 % IV BOLUS
1000.0000 mL | Freq: Once | INTRAVENOUS | Status: AC
  Administered 2019-01-15: 1000 mL via INTRAVENOUS

## 2019-01-15 MED ORDER — CEFTRIAXONE SODIUM 250 MG IJ SOLR
250.0000 mg | Freq: Once | INTRAMUSCULAR | Status: AC
  Administered 2019-01-15: 250 mg via INTRAMUSCULAR
  Filled 2019-01-15: qty 250

## 2019-01-15 MED ORDER — KETOROLAC TROMETHAMINE 15 MG/ML IJ SOLN
15.0000 mg | Freq: Once | INTRAMUSCULAR | Status: AC
  Administered 2019-01-15: 16:00:00 15 mg via INTRAVENOUS
  Filled 2019-01-15: qty 1

## 2019-01-15 MED ORDER — METRONIDAZOLE 500 MG PO TABS
2000.0000 mg | ORAL_TABLET | Freq: Once | ORAL | Status: AC
  Administered 2019-01-15: 16:00:00 2000 mg via ORAL
  Filled 2019-01-15: qty 4

## 2019-01-15 MED ORDER — STERILE WATER FOR INJECTION IJ SOLN
INTRAMUSCULAR | Status: AC
  Administered 2019-01-15: 16:00:00 0.9 mL
  Filled 2019-01-15: qty 10

## 2019-01-15 NOTE — ED Triage Notes (Signed)
Pt c/o left flank pain for couple days and burning with urination.

## 2019-01-15 NOTE — Discharge Instructions (Addendum)
You were seen in the ER today for burning with urination & abdominal pain. Your blood work was overall reassuring.  Your urine did have some blood in it- this will need to be rechecked by primary care within the next 1 week.  We have tested you for gonorrhea, chlamydia, HIV & syphilis.  We will call you if results are positive- if positive you will need to inform all sexual partners.  Please abstain from intercourse for 10 days to prevent possible re-infection. Use protection when sexually active.   We have treated you prophylactically for gonorrhea, chlamydia, & trich in the ED. Please do not consume alcohol for 24 hours due to one of the medicines we gave you here leading to this being very dangerous.   Please follow up with primary care within 3 days for a recheck of your symptoms to ensure improvement 7 within 1 week for recheck of your urine. Return to the ER for new or worsening symptoms including but not limited to fever, increased pain, testicular pain/swelling, penile discharge, inability to keep fluids down, or any other concerns.

## 2019-01-15 NOTE — ED Provider Notes (Signed)
[facility] DEPT Provider Note   CSN: [id number] Arrival date & time: 01/15/19  0904     History   Chief Complaint Chief Complaint  Patient presents with  . Flank Pain  . Dysuria    HPI Steve Anderson is a 58 y.o. male with a hx of tobacco abuse & chronic pain who presents to the ED w/ complaints of urinary sxs & abdominal pain x 2 days. Patient reports dysuria, urgency, & frequency with associated fairly constant mild to moderate lower/suprapubic abdominal pain- no waxing/waning or colicky nature to discomfort. No alleviating/aggravating sxs. He is concerned for STDs as he had unprotected sex w/ male partner within past few weeks. Denies fever, chills, N/V/D, rectal pain, pain with a BM, hematuria, urinary retention/difficulty urinating, testicular pain/swelling, or penile discharge.       HPI  Past Medical History:  Diagnosis Date  . Chronic pain     Patient Active Problem List   Diagnosis Date Noted  . Dysuria 07/07/2013  . Possible exposure to STD 07/07/2013  . LLQ pain 07/07/2013    Past Surgical History:  Procedure Laterality Date  . ANKLE FRACTURE SURGERY Left   . ORTHOPEDIC SURGERY          Home Medications    Prior to Admission medications   Medication Sig Start Date End Date Taking? Authorizing Provider  fluticasone (FLONASE) 50 MCG/ACT nasal spray Place 2 sprays into both nostrils daily. Patient not taking: Reported on 06/21/2017 12/27/16   Muthersbaugh, Jarrett Soho, PA-C  ibuprofen (ADVIL,MOTRIN) 200 MG tablet Take 600-800 mg by mouth every 6 (six) hours as needed for fever, headache, mild pain, moderate pain or cramping.    [provider]  Multiple Vitamin (MULTIVITAMIN WITH MINERALS) TABS tablet Take 1 tablet by mouth daily.    [provider]    Family History No family history on file.  Social History Social History   Tobacco Use  . Smoking status: Current Every Day Smoker    Packs/day: 0.50   Types: Cigarettes  . Smokeless tobacco: Never Used  Substance Use Topics  . Alcohol use: Yes    Alcohol/week: 28.0 standard drinks    Types: 28 Cans of beer per week    Comment: daily  . Drug use: No     Allergies   Patient has no known allergies.   Review of Systems Review of Systems  Constitutional: Negative for chills and fever.  Respiratory: Negative for shortness of breath.   Cardiovascular: Negative for chest pain.  Gastrointestinal: Positive for abdominal pain. Negative for blood in stool, constipation, diarrhea, nausea, rectal pain and vomiting.  Genitourinary: Positive for dysuria, frequency and urgency. Negative for difficulty urinating, discharge, hematuria, scrotal swelling and testicular pain.  All other systems reviewed and are negative.    Physical Exam Updated Vital Signs BP 133/80 (BP Location: Right Arm)   Pulse 60   Temp 97.9 F (36.6 C) (Oral)   Resp 17   SpO2 99%   Physical Exam Vitals signs and nursing note reviewed. Exam conducted with a chaperone present.  Constitutional:      General: He is not in acute distress.    Appearance: He is well-developed. He is not toxic-appearing.  HENT:     Head: Normocephalic and atraumatic.  Eyes:     General:        Right eye: No discharge.        Left eye: No discharge.     Conjunctiva/sclera: Conjunctivae normal.  Neck:     Musculoskeletal: Neck supple.  Cardiovascular:     Rate and Rhythm: Normal rate and regular rhythm.  Pulmonary:     Effort: Pulmonary effort is normal. No respiratory distress.     Breath sounds: Normal breath sounds. No wheezing, rhonchi or rales.  Abdominal:     General: There is no distension.     Palpations: Abdomen is soft.     Tenderness: There is abdominal tenderness (mild) in the suprapubic area. There is no right CVA tenderness, left CVA tenderness, guarding or rebound. Negative signs include Murphy's sign and McBurney's sign.  Genitourinary:    Penis: Uncircumcised. No  phimosis, paraphimosis, erythema, discharge or lesions.      Scrotum/Testes: Normal. Cremasteric reflex is present.        Right: Mass, tenderness or swelling not present.        Left: Mass, tenderness or swelling not present.     Epididymis:     Right: Normal. Not inflamed or enlarged. No tenderness.     Left: Normal. Not inflamed or enlarged. No tenderness.     Comments: EDT present as chaperone.  Lymphadenopathy:     Lower Body: No right inguinal adenopathy. No left inguinal adenopathy.  Skin:    General: Skin is warm and dry.     Findings: No rash.  Neurological:     Mental Status: He is alert.     Comments: Clear speech.   Psychiatric:        Behavior: Behavior normal.    ED Treatments / Results  Labs (all labs ordered are listed, but only abnormal results are displayed) Labs Reviewed  URINALYSIS, ROUTINE W REFLEX MICROSCOPIC - Abnormal; Notable for the following components:      Result Value   Hgb urine dipstick SMALL (*)    All other components within normal limits  BASIC METABOLIC PANEL - Abnormal; Notable for the following components:   Glucose, Bld 101 (*)    Calcium 8.7 (*)    All other components within normal limits  URINE CULTURE  CBC  RPR  HIV ANTIBODY (ROUTINE TESTING W REFLEX)  GC/CHLAMYDIA PROBE AMP ([place] NOT [facility]    EKG None  Radiology No results found.  Procedures Procedures (including critical care time)  Medications Ordered in ED Medications  cefTRIAXone (ROCEPHIN) injection 250 mg (has no administration in time range)  azithromycin (ZITHROMAX) tablet 1,000 mg (has no administration in time range)  ondansetron (ZOFRAN-ODT) disintegrating tablet 8 mg (has no administration in time range)  metroNIDAZOLE (FLAGYL) tablet 2,000 mg (has no administration in time range)  ketorolac (TORADOL) 15 MG/ML injection 15 mg (has no administration in time range)  sterile water (preservative free) injection (has no administration in time range)   sodium chloride 0.9 % bolus 1,000 mL (1,000 mLs Intravenous New Bag/Given 01/15/19 1434)     Initial Impression / Assessment and Plan / ED Course  I have reviewed the triage vital signs and the nursing notes.  Pertinent labs & imaging results that were available during my care of the patient were reviewed by me and considered in my medical decision making (see chart for details).   Patient presents to the ED w/ complaints of urinary sxs & lower abdominal discomfort x 2 days with concern for STD exposure. Nontoxic appearing, no apparent distress, initial vitals WNL. On exam patient has mild suprapubic tenderness w/o peritoneal signs. GU exam w/o lesions, discharge, phimosis/paraphimosis, or testicular/epididymal tenderness. Plan for basic labs, UA, & STD  testing.   CBC: No leukocytosis or anemia.  BMP: No significant abnormalities, mild hypocalcemia, no renal dysfunction.  UA: Small hematuria present, no obvious infection- culture added based on his sxs- would tx if positive.  GC/chlamydia/HIV/RPR: Pending.   Pain is not unilateral or colicky in nature- doubt nephrolithiasis.  Repeat abdominal exam remains w/o peritoneal signs, no focal tenderness to areas to suggest appendicitis, cholecystitis, pancreatitis, diverticulitis, perf or obstruction.  Patient urinating in the ED w/o difficulty- no signs of retention/obstruction.  No H&P components to raise concern for orchitis, epididymitis, or prostatitis.   Unclear definitive etiology. Will cover for STDs w/ abx as above. PCP follow up for symptomatic recheck & repeat UA to re-assess hematuria. Discussed abstinence for 10 days and subsequent importance of protection when sexually active. I discussed results, treatment plan, need for follow-up, and return precautions with the patient. Provided opportunity for questions, patient confirmed understanding and is in agreement with plan.   Findings and plan of care discussed with supervising physician  Dr. Dalene SeltzerSchlossman who is in agreement.   Final Clinical Impressions(s) / ED Diagnoses   Final diagnoses:  Dysuria  Concern about STD in male without diagnosis  Hematuria, unspecified type    ED Discharge Orders    None       Cherly Andersonetrucelli, Arian Murley R, PA-C 01/15/19 1554    Alvira MondaySchlossman, Erin, MD 01/16/19 1601

## 2019-01-15 NOTE — ED Notes (Signed)
ED Provider at bedside. 

## 2019-01-16 LAB — URINE CULTURE: Culture: NO GROWTH

## 2019-01-16 LAB — RPR: RPR Ser Ql: NONREACTIVE

## 2019-01-16 LAB — HIV ANTIBODY (ROUTINE TESTING W REFLEX): HIV Screen 4th Generation wRfx: NONREACTIVE

## 2019-02-26 ENCOUNTER — Encounter (HOSPITAL_COMMUNITY): Payer: Self-pay

## 2019-02-26 ENCOUNTER — Ambulatory Visit (HOSPITAL_COMMUNITY)
Admission: EM | Admit: 2019-02-26 | Discharge: 2019-02-26 | Disposition: A | Discharge status: Home / Self Care | Payer: Medicaid Other | Attending: Family Medicine | Admitting: Family Medicine

## 2019-02-26 ENCOUNTER — Other Ambulatory Visit: Payer: Self-pay

## 2019-02-26 DIAGNOSIS — Z202 Contact with and (suspected) exposure to infections with a predominantly sexual mode of transmission: Secondary | ICD-10-CM | POA: Insufficient documentation

## 2019-02-26 DIAGNOSIS — R300 Dysuria: Secondary | ICD-10-CM | POA: Insufficient documentation

## 2019-02-26 DIAGNOSIS — R030 Elevated blood-pressure reading, without diagnosis of hypertension: Secondary | ICD-10-CM | POA: Diagnosis present

## 2019-02-26 DIAGNOSIS — F17210 Nicotine dependence, cigarettes, uncomplicated: Secondary | ICD-10-CM | POA: Diagnosis present

## 2019-02-26 MED ORDER — AZITHROMYCIN 250 MG PO TABS
1000.0000 mg | ORAL_TABLET | Freq: Once | ORAL | Status: AC
  Administered 2019-02-26: 1000 mg via ORAL

## 2019-02-26 MED ORDER — AZITHROMYCIN 250 MG PO TABS
ORAL_TABLET | ORAL | Status: AC
  Filled 2019-02-26: qty 4

## 2019-02-26 MED ORDER — CEFTRIAXONE SODIUM 250 MG IJ SOLR
INTRAMUSCULAR | Status: AC
  Filled 2019-02-26: qty 250

## 2019-02-26 MED ORDER — LIDOCAINE HCL (PF) 1 % IJ SOLN
INTRAMUSCULAR | Status: AC
  Filled 2019-02-26: qty 2

## 2019-02-26 MED ORDER — CEFTRIAXONE SODIUM 250 MG IJ SOLR
250.0000 mg | Freq: Once | INTRAMUSCULAR | Status: AC
  Administered 2019-02-26: 250 mg via INTRAMUSCULAR

## 2019-02-26 NOTE — Discharge Instructions (Addendum)
Your blood pressure was noted to be elevated during your visit today. You may return here within the next few days to recheck if unable to see your primary care doctor.  BP (!) 143/90 (BP Location: Right Arm)  You have been given the following medications today for treatment of suspected gonorrhea and/or chlamydia:  cefTRIAXone (ROCEPHIN) injection 250 mg azithromycin (ZITHROMAX) tablet 1,000 mg  Even though we have treated you today, we have sent testing for sexually transmitted infections. We will notify you of any positive results once they are received. If required, we will prescribe any medications you might need.  Please refrain from all sexual activity for at least the next seven days.

## 2019-02-26 NOTE — ED Triage Notes (Signed)
Pt states he needs to be tested for STD;s. Pt states he has painful urination x 3 days.

## 2019-02-28 LAB — CYTOLOGY, (ORAL, ANAL, URETHRAL) ANCILLARY ONLY
Chlamydia: NEGATIVE
Neisseria Gonorrhea: NEGATIVE
Trichomonas: NEGATIVE

## 2019-03-03 NOTE — ED Provider Notes (Signed)
[place]   [id number] 02/26/19 Arrival Time: 2094  ASSESSMENT & PLAN:  1. Dysuria   2. Possible exposure to STD   3. Elevated blood pressure reading without diagnosis of hypertension   4. Cigarette nicotine dependence without complication       Discharge Instructions     Your blood pressure was noted to be elevated during your visit today. You may return here within the next few days to recheck if unable to see your primary care doctor.  BP (!) 143/90 (BP Location: Right Arm)  You have been given the following medications today for treatment of suspected gonorrhea and/or chlamydia:  cefTRIAXone (ROCEPHIN) injection 250 mg azithromycin (ZITHROMAX) tablet 1,000 mg  Even though we have treated you today, we have sent testing for sexually transmitted infections. We will notify you of any positive results once they are received. If required, we will prescribe any medications you might need.  Please refrain from all sexual activity for at least the next seven days.    Three plus minutes of smoking cessation counseling done. Risks of smoking reviewed including possible lung disease (COPD, cancer) and cardiac disease.  Benefits of smoking cessation also presented. Currently in pre-contemplative stage with no desire to quit at this time. Encourage him to think about this. Also encouraged him to follow-up with a primary care provider should he desire to quit. PCP will be able to follow blood pressures. He will check them out of office and keep a log.  Pending: Labs Reviewed  CYTOLOGY, (ORAL, ANAL, URETHRAL) ANCILLARY ONLY   Will notify of any positive results. Instructed to refrain from sexual activity for at least seven days.  Reviewed expectations re: course of current medical issues. Questions answered. Outlined signs and symptoms indicating need for more acute intervention. Patient verbalized understanding. After Visit Summary given.   SUBJECTIVE:  Steve Anderson is a 58 y.o. male who requests STI screening. Mild dysuria noted gradually over the past 2-3 days. No penile discharge. No urinary frequency or urgency reported. Afebrile. No abdominal or pelvic pain. No n/v. No rashes or lesions. Sexually active with single male partner. OTC treatment: none reported. History of STI: none reported.  Increased blood pressure noted today. Reports that he has not been treated for hypertension in the past.  He reports no chest pain on exertion, no dyspnea on exertion, no swelling of ankles, no orthostatic dizziness or lightheadedness, no orthopnea or paroxysmal nocturnal dyspnea, no palpitations and no intermittent claudication symptoms.  Social History   Tobacco Use  Smoking Status Current Every Day Smoker  . Packs/day: 0.50  . Types: Cigarettes  Smokeless Tobacco Never Used   ROS: As per HPI. All other systems negative.   OBJECTIVE:  Vitals:   02/26/19 1815 02/26/19 1817  BP:  (!) 143/90  Resp:  18  Temp:  98.7 F (37.1 C)  TempSrc:  Oral  SpO2:  100%  Weight: 93 kg     General appearance: alert, cooperative, appears stated age and no distress Throat: lips, mucosa, and tongue normal; teeth and gums normal Neck: FROM; supple CV: RRR Lungs: CTAB; unlabored respirations Back: no CVA tenderness; FROM at waist Abdomen: soft, non-tender GU: normal appearing genitalia Skin: warm and dry Psychological: alert and cooperative; normal mood and affect.  No Known Allergies  Past Medical History:  Diagnosis Date  . Chronic pain    FH: HTN  Social History   Socioeconomic History  . Marital status: Single    Spouse name:  Not on file  . Number of children: Not on file  . Years of education: Not on file  . Highest education level: Not on file  Occupational History  . Not on file  Social Needs  . Financial resource strain: Not on file  . Food insecurity    Worry: Not on file    Inability: Not on file  . Transportation needs     Medical: Not on file    Non-medical: Not on file  Tobacco Use  . Smoking status: Current Every Day Smoker    Packs/day: 0.50    Types: Cigarettes  . Smokeless tobacco: Never Used  Substance and Sexual Activity  . Alcohol use: Yes    Alcohol/week: 28.0 standard drinks    Types: 28 Cans of beer per week    Comment: daily  . Drug use: No  . Sexual activity: Not on file  Lifestyle  . Physical activity    Days per week: Not on file    Minutes per session: Not on file  . Stress: Not on file  Relationships  . Social Musicianconnections    Talks on phone: Not on file    Gets together: Not on file    Attends religious service: Not on file    Active member of club or organization: Not on file    Attends meetings of clubs or organizations: Not on file    Relationship status: Not on file  . Intimate partner violence    Fear of current or ex partner: Not on file    Emotionally abused: Not on file    Physically abused: Not on file    Forced sexual activity: Not on file  Other Topics Concern  . Not on file  Social History Narrative  . Not on file          Mardella LaymanHagler, Delwyn Scoggin, MD 03/03/19 1226

## 2019-07-14 ENCOUNTER — Ambulatory Visit (HOSPITAL_COMMUNITY)
Admission: EM | Admit: 2019-07-14 | Discharge: 2019-07-14 | Disposition: A | Discharge status: Home / Self Care | Payer: Medicaid Other | Attending: Family Medicine | Admitting: Family Medicine

## 2019-07-14 ENCOUNTER — Encounter (HOSPITAL_COMMUNITY): Payer: Self-pay

## 2019-07-14 ENCOUNTER — Other Ambulatory Visit: Payer: Self-pay

## 2019-07-14 ENCOUNTER — Encounter (HOSPITAL_COMMUNITY): Payer: Self-pay | Admitting: Emergency Medicine

## 2019-07-14 ENCOUNTER — Emergency Department (HOSPITAL_COMMUNITY)
Admission: EM | Admit: 2019-07-14 | Discharge: 2019-07-14 | Disposition: A | Discharge status: Home / Self Care | Payer: Medicaid Other | Attending: Emergency Medicine | Admitting: Emergency Medicine

## 2019-07-14 DIAGNOSIS — R300 Dysuria: Secondary | ICD-10-CM | POA: Insufficient documentation

## 2019-07-14 DIAGNOSIS — F17210 Nicotine dependence, cigarettes, uncomplicated: Secondary | ICD-10-CM | POA: Diagnosis not present

## 2019-07-14 DIAGNOSIS — Z202 Contact with and (suspected) exposure to infections with a predominantly sexual mode of transmission: Secondary | ICD-10-CM | POA: Diagnosis present

## 2019-07-14 LAB — URINALYSIS, ROUTINE W REFLEX MICROSCOPIC
Bilirubin Urine: NEGATIVE
Glucose, UA: NEGATIVE mg/dL
Hgb urine dipstick: NEGATIVE
Ketones, ur: NEGATIVE mg/dL
Leukocytes,Ua: NEGATIVE
Nitrite: NEGATIVE
Protein, ur: NEGATIVE mg/dL
Specific Gravity, Urine: 1.017 (ref 1.005–1.030)
pH: 5 (ref 5.0–8.0)

## 2019-07-14 LAB — HIV ANTIBODY (ROUTINE TESTING W REFLEX): HIV Screen 4th Generation wRfx: NONREACTIVE

## 2019-07-14 MED ORDER — LIDOCAINE HCL (PF) 1 % IJ SOLN
INTRAMUSCULAR | Status: AC
  Filled 2019-07-14: qty 5

## 2019-07-14 MED ORDER — DOXYCYCLINE HYCLATE 100 MG PO CAPS: 100.0000 mg | ORAL_CAPSULE | Freq: Two times a day (BID) | ORAL | 0 refills | Status: AC

## 2019-07-14 MED ORDER — CEFTRIAXONE SODIUM 500 MG IJ SOLR
500.0000 mg | Freq: Once | INTRAMUSCULAR | Status: AC
  Administered 2019-07-14: 22:00:00 500 mg via INTRAMUSCULAR
  Filled 2019-07-14: qty 500

## 2019-07-14 NOTE — ED Triage Notes (Signed)
Patient presents to Urgent Care with complaints of penile burning since 2-3 days ago. Patient reports he believes he has an STD.

## 2019-07-14 NOTE — ED Notes (Signed)
Discharge instructions discussed with pt. Pt verbalized understanding. Pt stable and ambulatory. No signature pad available. 

## 2019-07-14 NOTE — ED Triage Notes (Signed)
Pt c/o burning with urination and penile discharge x 2 days. Pt states he thinks he has a STD.

## 2019-07-14 NOTE — ED Provider Notes (Signed)
Steve Anderson   CSN: [id number] Arrival date & time: 07/14/19  1910     History Chief Complaint  Patient presents with  . SEXUALLY TRANSMITTED DISEASE    Steve Anderson is a 59 y.o. male.  HPI    59 year old male presenting for evaluation of dysuria that has been ongoing for the last 4 days.  He also reports some suprapubic pain.  He denies any frequency, urgency, hematuria, nausea or vomiting.  He denies any penile discharge, pain or swelling to the scrotum or penile lesions.  He has had similar pain in the past when he has had an STD.  He is currently sexually active with one male partner and would like to be treated and tested for STDs.  Past Medical History:  Diagnosis Date  . Chronic pain     Patient Active Problem List   Diagnosis Date Noted  . Dysuria 07/07/2013  . Possible exposure to STD 07/07/2013  . LLQ pain 07/07/2013    Past Surgical History:  Procedure Laterality Date  . ANKLE FRACTURE SURGERY Left   . ORTHOPEDIC SURGERY         Family History  Problem Relation Age of Onset  . Hypertension Mother   . Hypertension Father     Social History   Tobacco Use  . Smoking status: Current Every Day Smoker    Packs/day: 0.50    Types: Cigarettes  . Smokeless tobacco: Never Used  Substance Use Topics  . Alcohol use: Yes    Alcohol/week: 28.0 standard drinks    Types: 28 Cans of beer per week    Comment: daily  . Drug use: No    Home Medications Prior to Admission medications   Medication Sig Start Date End Date Taking? Authorizing Provider  doxycycline (VIBRAMYCIN) 100 MG capsule Take 1 capsule (100 mg total) by mouth 2 (two) times daily for 7 days. 07/14/19 07/21/19  Yaacov Koziol S, PA-C  ibuprofen (ADVIL,MOTRIN) 200 MG tablet Take 600-800 mg by mouth every 6 (six) hours as needed for fever, headache, mild pain, moderate pain or cramping.    [provider]  Multiple Vitamin  (MULTIVITAMIN WITH MINERALS) TABS tablet Take 1 tablet by mouth daily.    [provider]  fluticasone (FLONASE) 50 MCG/ACT nasal spray Place 2 sprays into both nostrils daily. Patient not taking: Reported on 06/21/2017 12/27/16 07/14/19  Muthersbaugh, Dahlia Client, PA-C    Allergies    Patient has no known allergies.  Review of Systems   Review of Systems  Constitutional: Negative for fever.  HENT: Negative for ear pain and sore throat.   Eyes: Negative for visual disturbance.  Respiratory: Negative for cough and shortness of breath.   Cardiovascular: Negative for chest pain.  Gastrointestinal: Positive for abdominal pain (suprapubic pain). Negative for constipation, diarrhea, nausea and vomiting.  Genitourinary: Positive for dysuria. Negative for flank pain, frequency, hematuria and urgency.  Musculoskeletal: Negative for back pain.  Skin: Negative for rash.  Neurological: Negative for headaches.  All other systems reviewed and are negative.   Physical Exam Updated Vital Signs BP (!) 138/97 (BP Location: Left Arm)   Pulse 78   Temp 98 F (36.7 C) (Oral)   Resp 14   Ht 5\' 9"  (1.753 m)   Wt 95.3 kg   SpO2 100%   BMI 31.01 kg/m   Physical Exam Vitals and nursing Anderson reviewed.  Constitutional:      Appearance: He is well-developed.  HENT:  Head: Normocephalic and atraumatic.  Eyes:     Conjunctiva/sclera: Conjunctivae normal.  Cardiovascular:     Rate and Rhythm: Normal rate and regular rhythm.  Pulmonary:     Effort: Pulmonary effort is normal.     Breath sounds: Normal breath sounds.  Abdominal:     Palpations: Abdomen is soft.     Tenderness: There is no abdominal tenderness.  Genitourinary:    Comments: Chaperone present. Normal uncircumcised penis without lesions. No testicular swelling, pain or redness. Musculoskeletal:     Cervical back: Neck supple.  Skin:    General: Skin is warm and dry.  Neurological:     Mental Status: He is alert.     ED  Results / Procedures / Treatments   Labs (all labs ordered are listed, but only abnormal results are displayed) Labs Reviewed  URINALYSIS, ROUTINE W REFLEX MICROSCOPIC  HIV ANTIBODY (ROUTINE TESTING W REFLEX)  RPR  GC/CHLAMYDIA PROBE AMP ([place]) NOT AT [facility]    EKG None  Radiology No results found.  Procedures Procedures (including critical care time)  Medications Ordered in ED Medications  cefTRIAXone (ROCEPHIN) injection 500 mg (500 mg Intramuscular Given 07/14/19 2130)  lidocaine (PF) (XYLOCAINE) 1 % injection (  Given 07/14/19 2142)    ED Course  I have reviewed the triage vital signs and the nursing notes.  Pertinent labs & imaging results that were available during my care of the patient were reviewed by me and considered in my medical decision making (see chart for details).    MDM Rules/Calculators/A&P                      Patient is afebrile without abdominal tenderness, abdominal pain or painful bowel movements to indicate prostatitis.  No tenderness to palpation of the testes or epididymis to suggest orchitis or epididymitis.  STD cultures obtained including HIV, syphilis, gonorrhea and chlamydia. UA neg for UTI. Patient to be discharged with instructions to follow up with PCP. Discussed importance of using protection when sexually active. Pt understands that they have GC/Chlamydia cultures pending and that they will need to inform all sexual partners if results return positive. Patient has been treated prophylactically with doxycycline and Rocephin.      Final Clinical Impression(s) / ED Diagnoses Final diagnoses:  Possible exposure to STD    Rx / DC Orders ED Discharge Orders         Ordered    doxycycline (VIBRAMYCIN) 100 MG capsule  2 times daily     07/14/19 2121           Rodney Booze, PA-C 07/14/19 2214    Malvin Johns, MD 07/14/19 2321

## 2019-07-14 NOTE — ED Provider Notes (Signed)
BP 135/83 (BP Location: Right Arm)   Pulse 82   Temp 98.6 F (37 C) (Oral)   Resp 15   SpO2 98%   Patient presented to the urgent care center requesting STD testing and treatment.  He states he is having "burning in his private parts". I reviewed his medical record and he has been seen for this complaint multiple times.  I reviewed his test results and it appears that he has not had a positive test for gonorrhea, chlamydia, trichomonas, RPR, or HIV dating back to 2017  I told the patient that I am happy to see him.  I am happy to do a swab and sent it out the laboratory.  I am not, however, going to give him antibiotics until we get his test results back.  Patient became angry.  He states that he would go to the emergency room.  He states that "I need to get my shot".   Eustace Moore, MD 07/14/19 [place]

## 2019-07-14 NOTE — Discharge Instructions (Addendum)
You were given a prescription for antibiotics. Please take the antibiotic prescription fully.   You have been tested for HIV, syphilis, chlamydia and gonorrhea.  These results will be available in approximately 3 days and you will be contacted by the hospital if the results are positive. Avoid sexual contact until you are aware of the results, and please inform all sexual partners if you test positive for any of these diseases.  Please follow up with your primary care provider within 5-7 days for re-evaluation of your symptoms. If you do not have a primary care provider, information for a healthcare clinic has been provided for you to make arrangements for follow up care. Please return to the emergency department for any new or worsening symptoms.

## 2019-07-14 NOTE — Discharge Instructions (Signed)
LWBS I explained to the patient that I was happy to do a swab for infection.  We will call him if any of his tests are positive.  I have reviewed his medical record and there was no positive culture result as far back as 2017.  I feel it is inappropriate to treat him based on vague symptoms when his cultures are consistently negative.  He became angry and stated that he would go to the emergency room for "his shot".

## 2019-07-15 LAB — RPR: RPR Ser Ql: NONREACTIVE

## 2019-10-10 ENCOUNTER — Encounter (HOSPITAL_COMMUNITY): Payer: Self-pay

## 2019-10-10 ENCOUNTER — Other Ambulatory Visit: Payer: Self-pay

## 2019-10-10 ENCOUNTER — Ambulatory Visit (HOSPITAL_COMMUNITY)
Admission: EM | Admit: 2019-10-10 | Discharge: 2019-10-10 | Disposition: A | Discharge status: Home / Self Care | Payer: Medicaid Other | Attending: Physician Assistant | Admitting: Physician Assistant

## 2019-10-10 DIAGNOSIS — R0981 Nasal congestion: Secondary | ICD-10-CM | POA: Insufficient documentation

## 2019-10-10 DIAGNOSIS — Z20822 Contact with and (suspected) exposure to covid-19: Secondary | ICD-10-CM | POA: Insufficient documentation

## 2019-10-10 MED ORDER — CETIRIZINE HCL 10 MG PO TABS: 10.0000 mg | ORAL_TABLET | Freq: Every day | ORAL | 0 refills | Status: DC

## 2019-10-10 NOTE — ED Triage Notes (Signed)
Nasal congestion x 2 days.  Denies fever, HA, SOB, or any other symptoms.  Ibuprofen yesterday and a cold tablet yesterday with some improvement.  No known exposure.  Pt requests COVID test.   Pt also requests pain medicine for left ankle.

## 2019-10-10 NOTE — Discharge Instructions (Addendum)
Try the Zyrtec/cetirizine daily for your nasal congestion  If your Covid-19 test is positive, you will receive a phone call from [facility] regarding your results. Negative test results are not called. Both positive and negative results area always visible on MyChart. If you do not have a MyChart account, sign up instructions are in your discharge papers.   Persons who are directed to care for themselves at home may discontinue isolation under the following conditions:   At least 10 days have passed since symptom onset and  At least 24 hours have passed without running a fever (this means without the use of fever-reducing medications) and  Other symptoms have improved.  Persons infected with COVID-19 who never develop symptoms may discontinue isolation and other precautions 10 days after the date of their first positive COVID-19 test.

## 2019-10-10 NOTE — ED Provider Notes (Signed)
[place]    CSN: [id number] Arrival date & time: 10/10/19  1228      History   Chief Complaint Chief Complaint  Patient presents with  . Nasal Congestion    HPI Steve Anderson is a 59 y.o. male.   Patient reports for Covid testing due to 2 days of nasal congestion.  He denies cough, headache, sore throat, fever, chills, nausea, vomiting, diarrhea.  He has had the occasional sneeze.  Denies any watery eyes.  Denies sick contacts.  Would like to be Covid tested.     Past Medical History:  Diagnosis Date  . Chronic pain     Patient Active Problem List   Diagnosis Date Noted  . Dysuria 07/07/2013  . Possible exposure to STD 07/07/2013  . LLQ pain 07/07/2013    Past Surgical History:  Procedure Laterality Date  . ANKLE FRACTURE SURGERY Left   . ORTHOPEDIC SURGERY         Home Medications    Prior to Admission medications   Medication Sig Start Date End Date Taking? Authorizing Provider  cetirizine (ZYRTEC ALLERGY) 10 MG tablet Take 1 tablet (10 mg total) by mouth daily. 10/10/19   Ranada Vigorito, Marguerita Beards, PA-C  ibuprofen (ADVIL,MOTRIN) 200 MG tablet Take 600-800 mg by mouth every 6 (six) hours as needed for fever, headache, mild pain, moderate pain or cramping.    [provider]  Multiple Vitamin (MULTIVITAMIN WITH MINERALS) TABS tablet Take 1 tablet by mouth daily.    [provider]  fluticasone (FLONASE) 50 MCG/ACT nasal spray Place 2 sprays into both nostrils daily. Patient not taking: Reported on 06/21/2017 12/27/16 07/14/19  Muthersbaugh, Jarrett Soho, PA-C    Family History Family History  Problem Relation Age of Onset  . Hypertension Mother   . Hypertension Father     Social History Social History   Tobacco Use  . Smoking status: Current Every Day Smoker    Packs/day: 0.50    Types: Cigarettes  . Smokeless tobacco: Never Used  Substance Use Topics  . Alcohol use: Yes    Alcohol/week: 28.0 standard drinks    Types: 28 Cans of  beer per week    Comment: daily  . Drug use: No     Allergies   Patient has no known allergies.   Review of Systems Review of Systems Per HPI  Physical Exam Triage Vital Signs ED Triage Vitals  Enc Vitals Group     BP 10/10/19 1351 (!) 141/83     Pulse Rate 10/10/19 1351 80     Resp 10/10/19 1351 20     Temp 10/10/19 1351 97.9 F (36.6 C)     Temp Source 10/10/19 1351 Oral     SpO2 10/10/19 1351 98 %     Weight --      Height --      Head Circumference --      Peak Flow --      Pain Score 10/10/19 1347 0     Pain Loc --      Pain Edu? --      Excl. in [place]? --    No data found.  Updated Vital Signs BP (!) 141/83 (BP Location: Left Arm)   Pulse 80   Temp 97.9 F (36.6 C) (Oral)   Resp 20   SpO2 98%   Visual Acuity Right Eye Distance:   Left Eye Distance:   Bilateral Distance:    Right Eye Near:   Left Eye  Near:    Bilateral Near:     Physical Exam Vitals and nursing note reviewed.  Constitutional:      Appearance: He is well-developed.  HENT:     Head: Normocephalic and atraumatic.     Nose:     Comments: Mildly swollen and boggy turbinates.  Minimal discharge. Eyes:     Conjunctiva/sclera: Conjunctivae normal.  Cardiovascular:     Rate and Rhythm: Normal rate and regular rhythm.     Heart sounds: No murmur.  Pulmonary:     Effort: Pulmonary effort is normal. No respiratory distress.     Breath sounds: Normal breath sounds.  Musculoskeletal:     Cervical back: Neck supple.  Skin:    General: Skin is warm and dry.  Neurological:     Mental Status: He is alert.      UC Treatments / Results  Labs (all labs ordered are listed, but only abnormal results are displayed) Labs Reviewed  SARS CORONAVIRUS 2 (TAT 6-24 HRS)    EKG   Radiology No results found.  Procedures Procedures (including critical care time)  Medications Ordered in UC Medications - No data to display  Initial Impression / Assessment and Plan / UC Course  I have  reviewed the triage vital signs and the nursing notes.  Pertinent labs & imaging results that were available during my care of the patient were reviewed by me and considered in my medical decision making (see chart for details).     #Nasal congestion #Encounter for Covid testing Patient is a 59 year old 59 presenting with nasal congestion is most likely secondary to seasonal allergy.  He is requesting Covid PCR therefore this was sent.  Will start on Zyrtec.  Patient advised of how to find his results. Final Clinical Impressions(s) / UC Diagnoses   Final diagnoses:  Nasal congestion  Encounter for laboratory testing for COVID-19 virus     Discharge Instructions     Try the Zyrtec/cetirizine daily for your nasal congestion  If your Covid-19 test is positive, you will receive a phone call from [facility] regarding your results. Negative test results are not called. Both positive and negative results area always visible on MyChart. If you do not have a MyChart account, sign up instructions are in your discharge papers.   Persons who are directed to care for themselves at home may discontinue isolation under the following conditions:  . At least 10 days have passed since symptom onset and . At least 24 hours have passed without running a fever (this means without the use of fever-reducing medications) and . Other symptoms have improved.  Persons infected with COVID-19 who never develop symptoms may discontinue isolation and other precautions 10 days after the date of their first positive COVID-19 test.     ED Prescriptions    Medication Sig Dispense Auth. Provider   cetirizine (ZYRTEC ALLERGY) 10 MG tablet Take 1 tablet (10 mg total) by mouth daily. 30 tablet Carry Ortez, Veryl Speak, PA-C     PDMP not reviewed this encounter.   Hermelinda Medicus, PA-C 10/10/19 1409

## 2019-10-11 LAB — SARS CORONAVIRUS 2 (TAT 6-24 HRS): SARS Coronavirus 2: NEGATIVE

## 2019-11-27 ENCOUNTER — Other Ambulatory Visit: Payer: Self-pay

## 2019-11-27 ENCOUNTER — Encounter (HOSPITAL_COMMUNITY): Payer: Self-pay

## 2019-11-27 ENCOUNTER — Emergency Department (HOSPITAL_COMMUNITY)
Admission: EM | Admit: 2019-11-27 | Discharge: 2019-11-27 | Disposition: A | Discharge status: Home / Self Care | Payer: Medicaid Other | Attending: Emergency Medicine | Admitting: Emergency Medicine

## 2019-11-27 DIAGNOSIS — R109 Unspecified abdominal pain: Secondary | ICD-10-CM

## 2019-11-27 DIAGNOSIS — Z711 Person with feared health complaint in whom no diagnosis is made: Secondary | ICD-10-CM | POA: Diagnosis not present

## 2019-11-27 DIAGNOSIS — R309 Painful micturition, unspecified: Secondary | ICD-10-CM | POA: Diagnosis present

## 2019-11-27 DIAGNOSIS — R1030 Lower abdominal pain, unspecified: Secondary | ICD-10-CM | POA: Insufficient documentation

## 2019-11-27 DIAGNOSIS — Z79899 Other long term (current) drug therapy: Secondary | ICD-10-CM | POA: Insufficient documentation

## 2019-11-27 DIAGNOSIS — F17210 Nicotine dependence, cigarettes, uncomplicated: Secondary | ICD-10-CM | POA: Diagnosis not present

## 2019-11-27 DIAGNOSIS — M545 Low back pain: Secondary | ICD-10-CM | POA: Insufficient documentation

## 2019-11-27 LAB — URINALYSIS, ROUTINE W REFLEX MICROSCOPIC
Bilirubin Urine: NEGATIVE
Glucose, UA: NEGATIVE mg/dL
Hgb urine dipstick: NEGATIVE
Ketones, ur: NEGATIVE mg/dL
Leukocytes,Ua: NEGATIVE
Nitrite: NEGATIVE
Protein, ur: NEGATIVE mg/dL
Specific Gravity, Urine: 1.016 (ref 1.005–1.030)
pH: 7 (ref 5.0–8.0)

## 2019-11-27 LAB — CBC WITH DIFFERENTIAL/PLATELET
Abs Immature Granulocytes: 0.01 10*3/uL (ref 0.00–0.07)
Basophils Absolute: 0 10*3/uL (ref 0.0–0.1)
Basophils Relative: 1 %
Eosinophils Absolute: 0.1 10*3/uL (ref 0.0–0.5)
Eosinophils Relative: 2 %
HCT: 42.9 % (ref 39.0–52.0)
Hemoglobin: 14.9 g/dL (ref 13.0–17.0)
Immature Granulocytes: 0 %
Lymphocytes Relative: 35 %
Lymphs Abs: 1.6 10*3/uL (ref 0.7–4.0)
MCH: 31.2 pg (ref 26.0–34.0)
MCHC: 34.7 g/dL (ref 30.0–36.0)
MCV: 89.7 fL (ref 80.0–100.0)
Monocytes Absolute: 0.4 10*3/uL (ref 0.1–1.0)
Monocytes Relative: 10 %
Neutro Abs: 2.4 10*3/uL (ref 1.7–7.7)
Neutrophils Relative %: 52 %
Platelets: 154 10*3/uL (ref 150–400)
RBC: 4.78 MIL/uL (ref 4.22–5.81)
RDW: 13.8 % (ref 11.5–15.5)
WBC: 4.6 10*3/uL (ref 4.0–10.5)
nRBC: 0 % (ref 0.0–0.2)

## 2019-11-27 LAB — BASIC METABOLIC PANEL
Anion gap: 7 (ref 5–15)
BUN: 10 mg/dL (ref 6–20)
CO2: 22 mmol/L (ref 22–32)
Calcium: 8.7 mg/dL — ABNORMAL LOW (ref 8.9–10.3)
Chloride: 109 mmol/L (ref 98–111)
Creatinine, Ser: 0.86 mg/dL (ref 0.61–1.24)
GFR calc Af Amer: >60 mL/min (ref >60)
GFR calc non Af Amer: >60 mL/min (ref >60)
Glucose, Bld: 110 mg/dL — ABNORMAL HIGH (ref 70–99)
Potassium: 4.1 mmol/L (ref 3.5–5.1)
Sodium: 138 mmol/L (ref 135–145)

## 2019-11-27 MED ORDER — CEFTRIAXONE SODIUM 1 G IJ SOLR
500.0000 mg | Freq: Once | INTRAMUSCULAR | Status: AC
  Administered 2019-11-27: 500 mg via INTRAMUSCULAR
  Filled 2019-11-27: qty 10

## 2019-11-27 MED ORDER — AZITHROMYCIN 250 MG PO TABS
1000.0000 mg | ORAL_TABLET | Freq: Once | ORAL | Status: AC
  Administered 2019-11-27: 1000 mg via ORAL
  Filled 2019-11-27: qty 4
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
## 2019-11-27 NOTE — Discharge Instructions (Addendum)
You were seen in the ER for burning with urination and abdominal and flank pain.  Lab work and urinalysis were normal  The cause of your symptoms is unclear but he has had similar symptoms in the past with STDs  You were treated for gonorrhea and chlamydia today.  Results come back in the next couple of days, you can check MyChart for your results.  Abstain from any sexual encounters for the next 7 days after treatment.  Stay hydrated.  Alternate ibuprofen and Tylenol as needed for pain.  Return to the ER for fever, worsening pain, inability to urinate, testicular pain or swelling, changes in bowel movements

## 2019-11-27 NOTE — ED Triage Notes (Signed)
Pt presents with c/o left side flank pain for the past 3-4 days. Pt denies any N/V or hematuria. Pt denies any hx of kidney stones.

## 2020-01-08 ENCOUNTER — Encounter (HOSPITAL_COMMUNITY): Payer: Self-pay | Admitting: Emergency Medicine

## 2020-01-08 ENCOUNTER — Ambulatory Visit (HOSPITAL_COMMUNITY)
Admission: EM | Admit: 2020-01-08 | Discharge: 2020-01-08 | Disposition: A | Discharge status: Home / Self Care | Payer: Medicaid Other | Attending: Family Medicine | Admitting: Family Medicine

## 2020-01-08 ENCOUNTER — Encounter (HOSPITAL_COMMUNITY): Payer: Self-pay

## 2020-01-08 ENCOUNTER — Other Ambulatory Visit: Payer: Self-pay

## 2020-01-08 ENCOUNTER — Emergency Department (HOSPITAL_COMMUNITY)
Admission: EM | Admit: 2020-01-08 | Discharge: 2020-01-08 | Disposition: A | Discharge status: Home / Self Care | Payer: Medicaid Other | Attending: Emergency Medicine | Admitting: Emergency Medicine

## 2020-01-08 DIAGNOSIS — R1110 Vomiting, unspecified: Secondary | ICD-10-CM

## 2020-01-08 DIAGNOSIS — Z5321 Procedure and treatment not carried out due to patient leaving prior to being seen by health care provider: Secondary | ICD-10-CM | POA: Insufficient documentation

## 2020-01-08 DIAGNOSIS — R109 Unspecified abdominal pain: Secondary | ICD-10-CM | POA: Insufficient documentation

## 2020-01-08 DIAGNOSIS — R300 Dysuria: Secondary | ICD-10-CM | POA: Diagnosis present

## 2020-01-08 DIAGNOSIS — R197 Diarrhea, unspecified: Secondary | ICD-10-CM | POA: Diagnosis not present

## 2020-01-08 DIAGNOSIS — R110 Nausea: Secondary | ICD-10-CM | POA: Insufficient documentation

## 2020-01-08 LAB — COMPREHENSIVE METABOLIC PANEL
ALT: 12 U/L (ref 0–44)
AST: 14 U/L — ABNORMAL LOW (ref 15–41)
Albumin: 3.9 g/dL (ref 3.5–5.0)
Alkaline Phosphatase: 49 U/L (ref 38–126)
Anion gap: 10 (ref 5–15)
BUN: 10 mg/dL (ref 6–20)
CO2: 21 mmol/L — ABNORMAL LOW (ref 22–32)
Calcium: 8.6 mg/dL — ABNORMAL LOW (ref 8.9–10.3)
Chloride: 108 mmol/L (ref 98–111)
Creatinine, Ser: 0.77 mg/dL (ref 0.61–1.24)
GFR calc Af Amer: >60 mL/min (ref >60)
GFR calc non Af Amer: >60 mL/min (ref >60)
Glucose, Bld: 82 mg/dL (ref 70–99)
Potassium: 3.7 mmol/L (ref 3.5–5.1)
Sodium: 139 mmol/L (ref 135–145)
Total Bilirubin: 0.7 mg/dL (ref 0.3–1.2)
Total Protein: 6.8 g/dL (ref 6.5–8.1)

## 2020-01-08 LAB — POCT URINALYSIS DIP (DEVICE)
Bilirubin Urine: NEGATIVE
Glucose, UA: NEGATIVE mg/dL
Hgb urine dipstick: NEGATIVE
Ketones, ur: NEGATIVE mg/dL
Leukocytes,Ua: NEGATIVE
Nitrite: NEGATIVE
Protein, ur: NEGATIVE mg/dL
Specific Gravity, Urine: 1.025 (ref 1.005–1.030)
Urobilinogen, UA: 2 mg/dL — ABNORMAL HIGH (ref 0.0–1.0)
pH: 6 (ref 5.0–8.0)

## 2020-01-08 LAB — CBC
HCT: 43 % (ref 39.0–52.0)
Hemoglobin: 14.6 g/dL (ref 13.0–17.0)
MCH: 30.9 pg (ref 26.0–34.0)
MCHC: 34 g/dL (ref 30.0–36.0)
MCV: 91.1 fL (ref 80.0–100.0)
Platelets: 151 10*3/uL (ref 150–400)
RBC: 4.72 MIL/uL (ref 4.22–5.81)
RDW: 13.8 % (ref 11.5–15.5)
WBC: 4.3 10*3/uL (ref 4.0–10.5)
nRBC: 0 % (ref 0.0–0.2)

## 2020-01-08 LAB — LIPASE, BLOOD: Lipase: 37 U/L (ref 11–51)

## 2020-01-08 MED ORDER — SODIUM CHLORIDE 0.9% FLUSH: 3.0000 mL | Freq: Once | INTRAVENOUS | Status: DC

## 2020-01-08 NOTE — ED Triage Notes (Signed)
Pt states he has been having abdominal pain along with nausea and diarrhea for about 4 days now.

## 2020-01-08 NOTE — ED Provider Notes (Signed)
[facility]    CSN: [id number] Arrival date & time: 01/08/20  1324      History   Chief Complaint Chief Complaint  Patient presents with  . Abdominal Pain  . Diarrhea  . Emesis    HPI Steve Anderson is a 59 y.o. male.   Patient is a 59 year old male that presents today with abdominal cramping and dysuria for 4 days.  He has also had some associated lower abdominal discomfort, vomiting and diarrhea.  History of chronic dysuria and has had multiple, negative STD screenings in the past.  No fevers, chills, body aches, night sweats.  ROS per HPI      Past Medical History:  Diagnosis Date  . Chronic pain     Patient Active Problem List   Diagnosis Date Noted  . Dysuria 07/07/2013  . Possible exposure to STD 07/07/2013  . LLQ pain 07/07/2013    Past Surgical History:  Procedure Laterality Date  . ANKLE FRACTURE SURGERY Left   . ORTHOPEDIC SURGERY         Home Medications    Prior to Admission medications   Medication Sig Start Date End Date Taking? Authorizing Provider  cetirizine (ZYRTEC ALLERGY) 10 MG tablet Take 1 tablet (10 mg total) by mouth daily. Patient not taking: Reported on 11/27/2019 10/10/19   Darr, Veryl Speak, PA-C  ibuprofen (ADVIL,MOTRIN) 200 MG tablet Take 600-800 mg by mouth every 6 (six) hours as needed for fever, headache, mild pain, moderate pain or cramping.    [provider]  Multiple Vitamin (MULTIVITAMIN WITH MINERALS) TABS tablet Take 1 tablet by mouth daily.    [provider]  fluticasone (FLONASE) 50 MCG/ACT nasal spray Place 2 sprays into both nostrils daily. Patient not taking: Reported on 06/21/2017 12/27/16 07/14/19  Muthersbaugh, Dahlia Client, PA-C    Family History Family History  Problem Relation Age of Onset  . Hypertension Mother   . Hypertension Father     Social History Social History   Tobacco Use  . Smoking status: Current Every Day Smoker    Packs/day: 0.50    Types: Cigarettes  .  Smokeless tobacco: Never Used  Vaping Use  . Vaping Use: Never used  Substance Use Topics  . Alcohol use: Not Currently    Alcohol/week: 28.0 standard drinks    Types: 28 Cans of beer per week    Comment: daily  . Drug use: No     Allergies   Patient has no known allergies.   Review of Systems Review of Systems   Physical Exam Triage Vital Signs ED Triage Vitals  Enc Vitals Group     BP 01/08/20 1443 (!) 137/83     Pulse Rate 01/08/20 1443 67     Resp 01/08/20 1443 14     Temp 01/08/20 1443 99 F (37.2 C)     Temp src --      SpO2 01/08/20 1443 97 %     Weight --      Height --      Head Circumference --      Peak Flow --      Pain Score 01/08/20 1441 5     Pain Loc --      Pain Edu? --      Excl. in [place]? --    No data found.  Updated Vital Signs BP (!) 137/83   Pulse 67   Temp 99 F (37.2 C)   Resp 14   SpO2 97%  Visual Acuity Right Eye Distance:   Left Eye Distance:   Bilateral Distance:    Right Eye Near:   Left Eye Near:    Bilateral Near:     Physical Exam Vitals and nursing note reviewed.  Constitutional:      Appearance: Normal appearance.  HENT:     Head: Normocephalic and atraumatic.     Nose: Nose normal.  Eyes:     Conjunctiva/sclera: Conjunctivae normal.  Pulmonary:     Effort: Pulmonary effort is normal.  Abdominal:     Palpations: Abdomen is soft.     Tenderness: There is no abdominal tenderness.    Musculoskeletal:        General: Normal range of motion.     Cervical back: Normal range of motion.  Skin:    General: Skin is warm and dry.  Neurological:     Mental Status: He is alert.  Psychiatric:        Mood and Affect: Mood normal.      UC Treatments / Results  Labs (all labs ordered are listed, but only abnormal results are displayed) Labs Reviewed  POCT URINALYSIS DIP (DEVICE) - Abnormal; Notable for the following components:      Result Value   Urobilinogen, UA 2.0 (*)    All other components within  normal limits  CYTOLOGY, (ORAL, ANAL, URETHRAL) ANCILLARY ONLY    EKG   Radiology No results found.  Procedures Procedures (including critical care time)  Medications Ordered in UC Medications - No data to display  Initial Impression / Assessment and Plan / UC Course  I have reviewed the triage vital signs and the nursing notes.  Pertinent labs & imaging results that were available during my care of the patient were reviewed by me and considered in my medical decision making (see chart for details).     Dysuria Patient with chronic dysuria Has been seen multiple times for this in the ER and here at urgent care.  Has had multiple negative STD screenings. Deferring treatment until swab is back Urine negative for infection here today. Follow up as needed for continued or worsening symptoms  Final Clinical Impressions(s) / UC Diagnoses   Final diagnoses:  Dysuria     Discharge Instructions     Your urine did not show any infection We will send the swab for testing and call if positive You have had multiple negative STDs in the past so we will not treat unless the result is positive We should have the result in a day or 2. We will call you with any positive results. Follow up as needed for continued or worsening symptoms     ED Prescriptions    None     PDMP not reviewed this encounter.   Dahlia Byes A, NP 01/08/20 1520

## 2020-01-08 NOTE — Discharge Instructions (Addendum)
Your urine did not show any infection We will send the swab for testing and call if positive You have had multiple negative STDs in the past so we will not treat unless the result is positive We should have the result in a day or 2. We will call you with any positive results. Follow up as needed for continued or worsening symptoms

## 2020-01-08 NOTE — ED Triage Notes (Signed)
Patient reports abdominal cramping as well as burning with urination x4 day. Also has vomiting and diarrhea.

## 2020-01-09 LAB — CYTOLOGY, (ORAL, ANAL, URETHRAL) ANCILLARY ONLY
Chlamydia: NEGATIVE
Comment: NEGATIVE
Comment: NEGATIVE
Comment: NORMAL
Neisseria Gonorrhea: NEGATIVE
Trichomonas: NEGATIVE

## 2020-02-16 ENCOUNTER — Emergency Department (HOSPITAL_COMMUNITY): Payer: Medicaid Other

## 2020-02-16 ENCOUNTER — Other Ambulatory Visit: Payer: Self-pay

## 2020-02-16 ENCOUNTER — Emergency Department (HOSPITAL_COMMUNITY)
Admission: EM | Admit: 2020-02-16 | Discharge: 2020-02-16 | Disposition: A | Discharge status: Home / Self Care | Payer: Medicaid Other | Attending: Emergency Medicine | Admitting: Emergency Medicine

## 2020-02-16 DIAGNOSIS — Z20822 Contact with and (suspected) exposure to covid-19: Secondary | ICD-10-CM | POA: Diagnosis not present

## 2020-02-16 DIAGNOSIS — N401 Enlarged prostate with lower urinary tract symptoms: Secondary | ICD-10-CM | POA: Diagnosis not present

## 2020-02-16 DIAGNOSIS — N400 Enlarged prostate without lower urinary tract symptoms: Secondary | ICD-10-CM

## 2020-02-16 DIAGNOSIS — Z79899 Other long term (current) drug therapy: Secondary | ICD-10-CM | POA: Insufficient documentation

## 2020-02-16 DIAGNOSIS — R519 Headache, unspecified: Secondary | ICD-10-CM | POA: Insufficient documentation

## 2020-02-16 DIAGNOSIS — F17210 Nicotine dependence, cigarettes, uncomplicated: Secondary | ICD-10-CM | POA: Diagnosis not present

## 2020-02-16 DIAGNOSIS — K7689 Other specified diseases of liver: Secondary | ICD-10-CM | POA: Diagnosis not present

## 2020-02-16 DIAGNOSIS — R1030 Lower abdominal pain, unspecified: Secondary | ICD-10-CM

## 2020-02-16 DIAGNOSIS — R079 Chest pain, unspecified: Secondary | ICD-10-CM | POA: Diagnosis not present

## 2020-02-16 DIAGNOSIS — R0602 Shortness of breath: Secondary | ICD-10-CM | POA: Diagnosis present

## 2020-02-16 LAB — COMPREHENSIVE METABOLIC PANEL
ALT: 11 U/L (ref 0–44)
AST: 15 U/L (ref 15–41)
Albumin: 3.9 g/dL (ref 3.5–5.0)
Alkaline Phosphatase: 57 U/L (ref 38–126)
Anion gap: 10 (ref 5–15)
BUN: 7 mg/dL (ref 6–20)
CO2: 23 mmol/L (ref 22–32)
Calcium: 9.2 mg/dL (ref 8.9–10.3)
Chloride: 108 mmol/L (ref 98–111)
Creatinine, Ser: 0.93 mg/dL (ref 0.61–1.24)
GFR calc Af Amer: >60 mL/min (ref >60)
GFR calc non Af Amer: >60 mL/min (ref >60)
Glucose, Bld: 140 mg/dL — ABNORMAL HIGH (ref 70–99)
Potassium: 3.6 mmol/L (ref 3.5–5.1)
Sodium: 141 mmol/L (ref 135–145)
Total Bilirubin: 0.9 mg/dL (ref 0.3–1.2)
Total Protein: 7.1 g/dL (ref 6.5–8.1)

## 2020-02-16 LAB — URINALYSIS, ROUTINE W REFLEX MICROSCOPIC
Bacteria, UA: NONE SEEN
Bilirubin Urine: NEGATIVE
Glucose, UA: 50 mg/dL — AB
Ketones, ur: NEGATIVE mg/dL
Leukocytes,Ua: NEGATIVE
Nitrite: NEGATIVE
Protein, ur: NEGATIVE mg/dL
Specific Gravity, Urine: 1.017 (ref 1.005–1.030)
pH: 5 (ref 5.0–8.0)

## 2020-02-16 LAB — LIPASE, BLOOD: Lipase: 36 U/L (ref 11–51)

## 2020-02-16 LAB — CBC
HCT: 44.1 % (ref 39.0–52.0)
Hemoglobin: 15.1 g/dL (ref 13.0–17.0)
MCH: 31.3 pg (ref 26.0–34.0)
MCHC: 34.2 g/dL (ref 30.0–36.0)
MCV: 91.3 fL (ref 80.0–100.0)
Platelets: 182 10*3/uL (ref 150–400)
RBC: 4.83 MIL/uL (ref 4.22–5.81)
RDW: 13.1 % (ref 11.5–15.5)
WBC: 5.1 10*3/uL (ref 4.0–10.5)
nRBC: 0 % (ref 0.0–0.2)

## 2020-02-16 LAB — SARS CORONAVIRUS 2 BY RT PCR (HOSPITAL ORDER, PERFORMED IN ~~LOC~~ HOSPITAL LAB): SARS Coronavirus 2: NEGATIVE

## 2020-02-16 LAB — TROPONIN I (HIGH SENSITIVITY)
Troponin I (High Sensitivity): 3 ng/L (ref <18)
Troponin I (High Sensitivity): 3 ng/L (ref <18)

## 2020-02-16 LAB — TSH: TSH: 4.49 u[IU]/mL (ref 0.350–4.500)

## 2020-02-16 LAB — D-DIMER, QUANTITATIVE: D-Dimer, Quant: 0.56 ug/mL-FEU — ABNORMAL HIGH (ref 0.00–0.50)

## 2020-02-16 MED ORDER — DICYCLOMINE HCL 10 MG PO CAPS
10.0000 mg | ORAL_CAPSULE | Freq: Once | ORAL | Status: AC
  Administered 2020-02-16: 10 mg via ORAL
  Filled 2020-02-16: qty 1

## 2020-02-16 MED ORDER — IOHEXOL 350 MG/ML SOLN
100.0000 mL | Freq: Once | INTRAVENOUS | Status: AC | PRN
  Administered 2020-02-16: 100 mL via INTRAVENOUS

## 2020-02-16 MED ORDER — DICYCLOMINE HCL 20 MG PO TABS: 20.0000 mg | ORAL_TABLET | Freq: Two times a day (BID) | ORAL | 0 refills | Status: DC

## 2020-02-16 MED ORDER — DIPHENHYDRAMINE HCL 50 MG/ML IJ SOLN
25.0000 mg | Freq: Once | INTRAMUSCULAR | Status: AC
  Administered 2020-02-16: 25 mg via INTRAVENOUS
  Filled 2020-02-16: qty 1

## 2020-02-16 MED ORDER — PROCHLORPERAZINE EDISYLATE 10 MG/2ML IJ SOLN
10.0000 mg | Freq: Once | INTRAMUSCULAR | Status: AC
  Administered 2020-02-16: 10 mg via INTRAVENOUS
  Filled 2020-02-16: qty 2

## 2020-02-16 NOTE — ED Notes (Signed)
Patient transported to X-ray 

## 2020-02-16 NOTE — ED Provider Notes (Signed)
[facility] EMERGENCY DEPARTMENT Provider Note   CSN: [id number] Arrival date & time: 02/16/20  1253     History Chief Complaint  Patient presents with  . Shortness of Breath  . Headache    Steve Anderson is a 59 y.o. male.  HPI  60 year old male with a history of dysuria and chronic pain presents to the ER with multiple complaints, states "My stomach is jacked up, and I've got a headache and my chest has been weird" .  Patient endorses several days of lower abdominal pain, chest pain and shortness of breath on exertion, and a frontal headache.  Lower abdominal pain: Patient states that approximately for 2 or 3 days he has been having generalized lower abdominal pain.  Relieved with rest, worsened with movement.  He has not taken anything for his symptoms.  Denies any nausea or vomiting.  Denies any dysuria.  Last bowel movement was yesterday and normal.  Denies any back pain.  Endorses a 10/10 abdominal pain.  Per chart review, patient was seen last month at urgent care with similar abdominal complaints.  Has a history of chronic dysuria and multiple negative STD tests.  Has frequent visits to the ER for primary care type visits.  Chest pain: Patient states he has been having some chest pain which is centralized, does not travel, comes and goes.  He also endorses shortness of breath and chest pain with expiration.  Not vaccinated for Covid.  No cough, fevers or chills, sinus congestion  Headache: Patient endorses frontal headache.  Endorses some blurry vision associated with this headache.  No history of migraines, but states he has not been eating or drinking much be due to his abdominal pain.  Denies any unilateral weakness or numbness, slurring of his words, difficulty speaking    Past Medical History:  Diagnosis Date  . Chronic pain     Patient Active Problem List   Diagnosis Date Noted  . Dysuria 07/07/2013  . Possible exposure to STD 07/07/2013  . LLQ  pain 07/07/2013    Past Surgical History:  Procedure Laterality Date  . ANKLE FRACTURE SURGERY Left   . ORTHOPEDIC SURGERY         Family History  Problem Relation Age of Onset  . Hypertension Mother   . Hypertension Father     Social History   Tobacco Use  . Smoking status: Current Every Day Smoker    Packs/day: 0.50    Types: Cigarettes  . Smokeless tobacco: Never Used  Vaping Use  . Vaping Use: Never used  Substance Use Topics  . Alcohol use: Not Currently    Alcohol/week: 28.0 standard drinks    Types: 28 Cans of beer per week    Comment: daily  . Drug use: No    Home Medications Prior to Admission medications   Medication Sig Start Date End Date Taking? Authorizing Provider  cetirizine (ZYRTEC ALLERGY) 10 MG tablet Take 1 tablet (10 mg total) by mouth daily. 10/10/19   Darr, Veryl Speak, PA-C  dicyclomine (BENTYL) 20 MG tablet Take 1 tablet (20 mg total) by mouth 2 (two) times daily. 02/16/20   Mare Ferrari, PA-C  ibuprofen (ADVIL,MOTRIN) 200 MG tablet Take 600-800 mg by mouth every 6 (six) hours as needed for fever, headache, mild pain, moderate pain or cramping.    [provider]  Multiple Vitamin (MULTIVITAMIN WITH MINERALS) TABS tablet Take 1 tablet by mouth daily.    [provider]  fluticasone (  FLONASE) 50 MCG/ACT nasal spray Place 2 sprays into both nostrils daily. Patient not taking: Reported on 06/21/2017 12/27/16 07/14/19  Muthersbaugh, Dahlia ClientHannah, PA-C    Allergies    Patient has no known allergies.  Review of Systems   Review of Systems  Constitutional: Negative for chills and fever.  HENT: Negative for ear pain and sore throat.   Eyes: Negative for pain and visual disturbance.  Respiratory: Positive for shortness of breath. Negative for cough.   Cardiovascular: Positive for chest pain. Negative for palpitations.  Gastrointestinal: Positive for abdominal pain. Negative for blood in stool, constipation, diarrhea, nausea and vomiting.    Genitourinary: Negative for dysuria and hematuria.  Musculoskeletal: Negative for arthralgias and back pain.  Skin: Negative for color change and rash.  Neurological: Positive for headaches. Negative for seizures, syncope, facial asymmetry, weakness and light-headedness.  All other systems reviewed and are negative.   Physical Exam Updated Vital Signs BP 126/85   Pulse (!) 56   Temp 98.4 F (36.9 C) (Oral)   Resp 18   SpO2 98%   Physical Exam Vitals reviewed.  Constitutional:      General: He is not in acute distress.    Appearance: Normal appearance. He is well-developed. He is not ill-appearing, toxic-appearing or diaphoretic.  HENT:     Head: Normocephalic and atraumatic.     Mouth/Throat:     Mouth: Mucous membranes are moist.     Pharynx: Oropharynx is clear.  Eyes:     Extraocular Movements: Extraocular movements intact.     Pupils: Pupils are equal, round, and reactive to light.     Comments: Jaundiced sclera and mild exopthalmos noted   Cardiovascular:     Rate and Rhythm: Normal rate and regular rhythm.  Pulmonary:     Effort: Pulmonary effort is normal.     Breath sounds: Normal breath sounds.  Chest:     Chest wall: No tenderness.  Abdominal:     General: Abdomen is flat.     Palpations: Abdomen is soft.     Tenderness: There is abdominal tenderness. There is no right CVA tenderness, guarding or rebound. Negative signs include Murphy's sign, Rovsing's sign and McBurney's sign.     Hernia: No hernia is present.    Musculoskeletal:        General: No swelling. Normal range of motion.     Cervical back: Normal range of motion.  Skin:    General: Skin is warm and dry.     Findings: No erythema or rash.  Neurological:     General: No focal deficit present.     Mental Status: He is alert and oriented to person, place, and time.     Sensory: No sensory deficit.     Motor: No weakness.     Comments: Mental Status:  Alert, thought content appropriate, able  to give a coherent history. Speech fluent without evidence of aphasia. Able to follow 2 step commands without difficulty.  Cranial Nerves:  II: Peripheral visual fields grossly normal, pupils equal, round, reactive to light III,IV, VI: ptosis not present, extra-ocular motions intact bilaterally  V,VII: smile symmetric, facial light touch sensation equal VIII: hearing grossly normal to voice  X: uvula elevates symmetrically  XI: bilateral shoulder shrug symmetric and strong XII: midline tongue extension without fassiculations Motor:  Normal tone. 5/5 strength of BUE and BLE major muscle groups including strong and equal grip strength and dorsiflexion/plantar flexion Sensory: light touch normal in all extremities. Cerebellar: normal finger-to-nose  with bilateral upper extremities, Romberg sign absent Gait: normal gait and balance. Able to walk on toes and heels with ease.    Psychiatric:        Mood and Affect: Mood normal.        Behavior: Behavior normal.     ED Results / Procedures / Treatments   Labs (all labs ordered are listed, but only abnormal results are displayed) Labs Reviewed  COMPREHENSIVE METABOLIC PANEL - Abnormal; Notable for the following components:      Result Value   Glucose, Bld 140 (*)    All other components within normal limits  URINALYSIS, ROUTINE W REFLEX MICROSCOPIC - Abnormal; Notable for the following components:   Glucose, UA 50 (*)    Hgb urine dipstick SMALL (*)    All other components within normal limits  D-DIMER, QUANTITATIVE (NOT AT [facility]) - Abnormal; Notable for the following components:   D-Dimer, Quant 0.56 (*)    All other components within normal limits  SARS CORONAVIRUS 2 BY RT PCR (HOSPITAL ORDER, PERFORMED IN [facility])  LIPASE, BLOOD  CBC  TSH  TROPONIN I (HIGH SENSITIVITY)  TROPONIN I (HIGH SENSITIVITY)    EKG EKG Interpretation  Date/Time:  Monday February 16 2020 13:15:36 EDT Ventricular Rate:  75 PR  Interval:  132 QRS Duration: 88 QT Interval:  398 QTC Calculation: 444 R Axis:   82 Text Interpretation: Normal sinus rhythm Right atrial enlargement Borderline ECG Confirmed by Benjiman Core [phone]) on 02/16/2020 3:00:05 PM   Radiology DG Chest 2 View  Result Date: 02/16/2020 CLINICAL DATA:  Chest pain radiating to the bilateral chest, weakness and lightheadedness for 2 days EXAM: CHEST - 2 VIEW COMPARISON:  Radiograph 12/19/2014 FINDINGS: Stable area of bandlike opacity in the right basilar periphery compatible with scarring. No consolidation, features of edema, pneumothorax, or effusion. Radiodense line along the right paramediastinal border could reflect a tortuous thoracic esophagus or skin fold. Remaining cardiomediastinal contours are unremarkable. No acute osseous or soft tissue abnormality. Degenerative changes are present in the imaged spine and shoulders. Telemetry leads overlie the chest. IMPRESSION: 1. Radiodense line along the right paramediastinal border could reflect a tortuous thoracic esophagus or skin fold. Could consider repositioning with reimaging or CT. 2. No other acute cardiopulmonary abnormality. Electronically Signed   By: Kreg Shropshire M.D.   On: 02/16/2020 15:51   CT Angio Chest PE W and/or Wo Contrast  Result Date: 02/16/2020 CLINICAL DATA:  Status post trauma. EXAM: CT ANGIOGRAPHY CHEST, ABDOMEN AND PELVIS TECHNIQUE: Non-contrast CT of the chest was initially obtained. Multidetector CT imaging through the chest, abdomen and pelvis was performed using the standard protocol during bolus administration of intravenous contrast. Multiplanar reconstructed images and MIPs were obtained and reviewed to evaluate the vascular anatomy. CONTRAST:  OMNIPAQUE IOHEXOL 350 MG/ML SOLN COMPARISON:  None. FINDINGS: CT CHEST FINDINGS Cardiovascular: No significant vascular findings. Normal heart size. No pericardial effusion. Mediastinum/Nodes: No enlarged mediastinal, hilar, or  axillary lymph nodes. Thyroid gland, trachea, and esophagus demonstrate no significant findings. Lungs/Pleura: There is mild emphysematous lung disease. A 7.0 cm x 4.2 cm subpleural cyst versus bullous changes is seen along the posteromedial aspect of the right lung. Mild atelectasis is noted along the posterior aspects of the bilateral lower lobes. Mild linear scarring and/or atelectasis is also seen within the lateral aspect of the right lung base. There is no evidence of a pleural effusion or pneumothorax. Musculoskeletal: No chest wall mass or suspicious bone lesions identified.  CT ABDOMEN PELVIS FINDINGS Hepatobiliary: A stable 5 mm focus of parenchymal low attenuation is seen within the anterior aspect of the left lobe of the liver. No gallstones, gallbladder wall thickening, or biliary dilatation. Pancreas: Unremarkable. No pancreatic ductal dilatation or surrounding inflammatory changes. Spleen: Normal in size without focal abnormality. Adrenals/Urinary Tract: Adrenal glands are unremarkable. Kidneys are normal, without renal calculi, focal lesion, or hydronephrosis. Bladder is unremarkable. Stomach/Bowel: Stomach is within normal limits. Appendix appears normal. No evidence of bowel wall thickening, distention, or inflammatory changes. Vascular/Lymphatic: There is mild to moderate severity calcification of the abdominal aorta and bilateral common iliac arteries, without evidence of aneurysmal dilatation. No enlarged abdominal or pelvic lymph nodes. Reproductive: There is mild to moderate severity prostate gland enlargement. Other: No abdominal wall hernia or abnormality. No abdominopelvic ascites. Musculoskeletal: No acute or significant osseous findings. IMPRESSION: 1. No evidence of acute traumatic injury within the chest, abdomen or pelvis. 2. Mild emphysematous lung disease. 3. 7.0 cm x 4.2 cm subpleural cyst versus bullous changes. 4. Mild to moderate severity calcification of the abdominal aorta and  bilateral common iliac arteries, without evidence of aneurysmal dilatation. 5. Mild to moderate severity prostate gland enlargement. 6. Stable 5 mm focus of parenchymal low attenuation within the anterior aspect of the left lobe of the liver, likely representing a small cyst or hemangioma. Aortic Atherosclerosis (ICD10-I70.0) and Emphysema (ICD10-J43.9). Electronically Signed   By: Aram Candela M.D.   On: 02/16/2020 18:44   CT ABDOMEN PELVIS W CONTRAST  Result Date: 02/16/2020 CLINICAL DATA:  Status post trauma. EXAM: CT CHEST, ABDOMEN, AND PELVIS WITH CONTRAST TECHNIQUE: Multidetector CT imaging of the chest, abdomen and pelvis was performed following the standard protocol during bolus administration of intravenous contrast. CONTRAST:  OMNIPAQUE IOHEXOL 350 MG/ML SOLN COMPARISON:  June 21, 2017 FINDINGS: CT CHEST FINDINGS Cardiovascular: No significant vascular findings. Normal heart size. No pericardial effusion. Mediastinum/Nodes: No enlarged mediastinal, hilar, or axillary lymph nodes. Thyroid gland, trachea, and esophagus demonstrate no significant findings. Lungs/Pleura: There is mild emphysematous lung disease. A 7.0 cm x 4.2 cm subpleural cyst versus bullous changes is seen along the posteromedial aspect of the right lung. Mild atelectasis is noted along the posterior aspects of the bilateral lower lobes. Mild linear scarring and/or atelectasis is also seen within the lateral aspect of the right lung base. There is no evidence of a pleural effusion or pneumothorax. Musculoskeletal: No chest wall mass or suspicious bone lesions identified. CT ABDOMEN PELVIS FINDINGS Hepatobiliary: A stable 5 mm focus of parenchymal low attenuation is seen within the anterior aspect of the left lobe of the liver. No gallstones, gallbladder wall thickening, or biliary dilatation. Pancreas: Unremarkable. No pancreatic ductal dilatation or surrounding inflammatory changes. Spleen: Normal in size without focal  abnormality. Adrenals/Urinary Tract: Adrenal glands are unremarkable. Kidneys are normal, without renal calculi, focal lesion, or hydronephrosis. Bladder is unremarkable. Stomach/Bowel: Stomach is within normal limits. Appendix appears normal. No evidence of bowel wall thickening, distention, or inflammatory changes. Vascular/Lymphatic: There is mild to moderate severity calcification of the abdominal aorta and bilateral common iliac arteries, without evidence of aneurysmal dilatation. No enlarged abdominal or pelvic lymph nodes. Reproductive: There is mild to moderate severity prostate gland enlargement. Other: No abdominal wall hernia or abnormality. No abdominopelvic ascites. Musculoskeletal: No acute or significant osseous findings. IMPRESSION: 1. No evidence of acute traumatic injury within the chest, abdomen or pelvis. 2. Mild emphysematous lung disease. 3. 7.0 cm x 4.2 cm subpleural cyst versus bullous changes.  4. Mild to moderate severity calcification of the abdominal aorta and bilateral common iliac arteries, without evidence of aneurysmal dilatation. 5. Mild to moderate severity prostate gland enlargement. 6. Stable 5 mm focus of parenchymal low attenuation within the anterior aspect of the left lobe of the liver, likely representing a small cyst or hemangioma. Aortic Atherosclerosis (ICD10-I70.0) and Emphysema (ICD10-J43.9). Electronically Signed   By: Aram Candela M.D.   On: 02/16/2020 18:43    Procedures Procedures (including critical care time)  Medications Ordered in ED Medications  prochlorperazine (COMPAZINE) injection 10 mg (10 mg Intravenous Given 02/16/20 1629)  diphenhydrAMINE (BENADRYL) injection 25 mg (25 mg Intravenous Given 02/16/20 1631)  iohexol (OMNIPAQUE) 350 MG/ML injection 100 mL (100 mLs Intravenous Contrast Given 02/16/20 1755)  dicyclomine (BENTYL) capsule 10 mg (10 mg Oral Given 02/16/20 1944)    ED Course  I have reviewed the triage vital signs and the nursing  notes.  Pertinent labs & imaging results that were available during my care of the patient were reviewed by me and considered in my medical decision making (see chart for details).    MDM Rules/Calculators/A&P                         63:51PM:  59 year old male with abdominal pain, headache and chest pain or shortness of breath x2 days On presentation,, he is alert, oriented, nontoxic-appearing, no acute distress, nondiaphoretic, speaking in full sentences without increased work of breathing.  Physical exam with clear lung sounds, and lower abdominal tenderness.  Abdomen is soft.  No flank tenderness, patient not complaining of back pain.  Vitals overall reassuring, is not hypoxic, tachycardic, or febrile.  DDx includes diverticulitis, SBO, appendicitis, mesenteric iscemia chronic pain, dysuria, dissection,  PE, Covid, pneumonia, ACS, myocarditis, stroke, migraine  LABS:  Basic labs ordered in triage, CMP with no significant electrolyte abnormalities, glucose of 140.  Normal renal and liver function tests. CBC without leukocytosis, normal hemoglobin and platelet count Lipase normal UA with small hemoglobin, no evidence of UTI Covid negative  EKG normal sinus rhythm with no evidence of ST elevations or T wave inversions  We will add on troponin, D-dimer, TSH given noted exophthalmos.  We will also add on CT of the abdomen pelvis given abdominal tenderness, no recent abdominal imaging.  If the CT scan is negative, he will likely need follow-up with urology for further evaluation of hematuria. Doubt mesenteric ischemia given there was no pain out of proportion on exam. Doubt dissection as the patient has no back pain, is not diaphoretic, vitals stable   6:52PM: D-dimer positive at 0.56. TSH within normal limits. CTA of the chest, abdomen and pelvis with no evidence of acute intrathoracic or intraabdominal processes.  A 7 x 4.2 cm subpleural cyst versus bullous changes were noted. Patient does not  endorse a current history of smoking cigarettes. Mild to moderate severity calcification of the abdominal aorta and bilateral common iliac arteries without evidence of aneurysmal dilation. He also has some mild to moderate prostate gland enlargement. He also says a stable 5 mm attenuation on the anterior aspect of the left lobe of the liver which may be a small cyst or hemangioma.   On reevaluation, patient notes significant improvement in headache. Patient was also given Bentyl. We will draw a second troponin, however I suspect this will be normal. Will refer to urology, stressed establishment with a PCP.  7.54PM:  Troponin unchanged. Pt notes improvement in abdominal pain after  symptoms.  Reached out to transition of care to help facilitate PCP needs.  All the patient's questions have been answered to his satisfaction, he voices understanding and is agreeable to this plan.  Strict return precautions discussed.  At this stage in the ED course, the patient has been medically screened and stable for discharge.     Final Clinical Impression(s) / ED Diagnoses Final diagnoses:  Lower abdominal pain  Shortness of breath  Enlarged prostate  Liver cyst    Rx / DC Orders ED Discharge Orders         Ordered    dicyclomine (BENTYL) 20 MG tablet  2 times daily        02/16/20 1858           Leone Brand 02/16/20 Danie Binder, MD 02/16/20 2244

## 2020-02-16 NOTE — ED Notes (Signed)
Pt asking staff ready to go. RN informed PA, IV removed. Pt verbalized understanding of discharge instructions, including prescriptions and follow up care. Pt had no further questions, ambulated to lobby.

## 2020-02-16 NOTE — ED Triage Notes (Signed)
Pt reports generalized abdominal pain, shortness of breath with exertion, and headache x 2 days.

## 2020-02-16 NOTE — Social Work (Signed)
CSW met with Pt at bedside to discuss importance of having/utilizing primary care provider. Pt showed CSW his Medicaid card with the provider information.  [facility] [address] [place], [place] [address] Pt states that he understands the importance of primary care, but has not had a chance to make an appointment. 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 CSW advised against that, asked Pt for patience and informed RN of conversation.

## 2020-02-16 NOTE — Discharge Instructions (Addendum)
Your work-up today was overall reassuring.   As discussed, please make sure to follow-up with the urologist doctor provided in your discharge paperwork for the large prostate and blood in your urine.    You also have a cyst noted in your lungs, as well as a possible small cyst on your liver.  It is very important that you establish with a primary care doctor to manage your chronic health problems. The emergency room can only focus on the life-threatening things, however if you establish with a primary care doctor,  you will likely have much better management of your chronic problems. There is a phone number on your discharge paperwork that you may call in order to establish with a primary care doctor.  I have also provided the phone number for [facility] which is the free clinic through [facility]. I have also reached out to our social work team to help you with this as well.    Please take the medication prescribed as needed for abdominal pain.   Return to the ER if your symptoms worsen.

## 2020-06-09 ENCOUNTER — Other Ambulatory Visit: Payer: Self-pay

## 2020-06-09 ENCOUNTER — Encounter (HOSPITAL_COMMUNITY): Payer: Self-pay

## 2020-06-09 ENCOUNTER — Emergency Department (HOSPITAL_COMMUNITY)
Admission: EM | Admit: 2020-06-09 | Discharge: 2020-06-09 | Disposition: A | Discharge status: Home / Self Care | Payer: Medicaid Other | Attending: Emergency Medicine | Admitting: Emergency Medicine

## 2020-06-09 DIAGNOSIS — R300 Dysuria: Secondary | ICD-10-CM | POA: Insufficient documentation

## 2020-06-09 DIAGNOSIS — F17210 Nicotine dependence, cigarettes, uncomplicated: Secondary | ICD-10-CM | POA: Insufficient documentation

## 2020-06-09 DIAGNOSIS — Z202 Contact with and (suspected) exposure to infections with a predominantly sexual mode of transmission: Secondary | ICD-10-CM | POA: Diagnosis not present

## 2020-06-09 LAB — COMPREHENSIVE METABOLIC PANEL
ALT: 12 U/L (ref 0–44)
AST: 15 U/L (ref 15–41)
Albumin: 3.7 g/dL (ref 3.5–5.0)
Alkaline Phosphatase: 55 U/L (ref 38–126)
Anion gap: 9 (ref 5–15)
BUN: 13 mg/dL (ref 6–20)
CO2: 26 mmol/L (ref 22–32)
Calcium: 9.3 mg/dL (ref 8.9–10.3)
Chloride: 105 mmol/L (ref 98–111)
Creatinine, Ser: 1.06 mg/dL (ref 0.61–1.24)
GFR, Estimated: >60 mL/min (ref >60)
Glucose, Bld: 102 mg/dL — ABNORMAL HIGH (ref 70–99)
Potassium: 3.6 mmol/L (ref 3.5–5.1)
Sodium: 140 mmol/L (ref 135–145)
Total Bilirubin: 0.5 mg/dL (ref 0.3–1.2)
Total Protein: 7.1 g/dL (ref 6.5–8.1)

## 2020-06-09 LAB — CBC
HCT: 46.3 % (ref 39.0–52.0)
Hemoglobin: 15.4 g/dL (ref 13.0–17.0)
MCH: 30.2 pg (ref 26.0–34.0)
MCHC: 33.3 g/dL (ref 30.0–36.0)
MCV: 90.8 fL (ref 80.0–100.0)
Platelets: 168 10*3/uL (ref 150–400)
RBC: 5.1 MIL/uL (ref 4.22–5.81)
RDW: 13.6 % (ref 11.5–15.5)
WBC: 5.6 10*3/uL (ref 4.0–10.5)
nRBC: 0 % (ref 0.0–0.2)

## 2020-06-09 LAB — URINALYSIS, ROUTINE W REFLEX MICROSCOPIC
Bilirubin Urine: NEGATIVE
Glucose, UA: NEGATIVE mg/dL
Hgb urine dipstick: NEGATIVE
Ketones, ur: NEGATIVE mg/dL
Leukocytes,Ua: NEGATIVE
Nitrite: NEGATIVE
Protein, ur: NEGATIVE mg/dL
Specific Gravity, Urine: 1.014 (ref 1.005–1.030)
pH: 5 (ref 5.0–8.0)

## 2020-06-09 LAB — HIV ANTIBODY (ROUTINE TESTING W REFLEX): HIV Screen 4th Generation wRfx: NONREACTIVE

## 2020-06-09 LAB — LIPASE, BLOOD: Lipase: 40 U/L (ref 11–51)

## 2020-06-09 LAB — RAPID HIV SCREEN (HIV 1/2 AB+AG)
HIV 1/2 Antibodies: NONREACTIVE
HIV-1 P24 Antigen - HIV24: NONREACTIVE
Interpretation (HIV Ag Ab): NONREACTIVE

## 2020-06-09 MED ORDER — CEFTRIAXONE SODIUM 500 MG IJ SOLR
500.0000 mg | Freq: Once | INTRAMUSCULAR | Status: AC
  Administered 2020-06-09: 500 mg via INTRAMUSCULAR
  Filled 2020-06-09: qty 500

## 2020-06-09 MED ORDER — STERILE WATER FOR INJECTION IJ SOLN
INTRAMUSCULAR | Status: AC
  Filled 2020-06-09: qty 10

## 2020-06-09 MED ORDER — DOXYCYCLINE HYCLATE 100 MG PO CAPS: 100.0000 mg | ORAL_CAPSULE | Freq: Two times a day (BID) | ORAL | 0 refills | Status: AC

## 2020-06-09 NOTE — ED Notes (Signed)
Pt stated that he has very little belly pain

## 2020-06-09 NOTE — ED Triage Notes (Signed)
Pt here for eval of "bad stomach cramps" x3days. Denies NVD. States he had unprotected sex with a male & that "really messed him up." Endorses burning with urination.

## 2020-06-09 NOTE — ED Notes (Signed)
Pt left prior to VS. This RN walked back into pt's location to obtain VS, ot not found.

## 2020-06-09 NOTE — ED Provider Notes (Signed)
[facility] EMERGENCY DEPARTMENT Provider Note   CSN: [id number] Arrival date & time: 06/09/20  6759     History Chief Complaint  Patient presents with  . Abdominal Pain  . Dysuria    Steve Anderson is a 59 y.o. male.  HPI Patient is a 59 year old male with a medical history as noted below.  He presents the emergency department today for STD testing.  Patient states he had unprotected sex with a male partner about 2 weeks ago.  He began experiencing dysuria after this occurred.  No hematuria.  Additionally was complaining of diffuse intermittent abdominal cramping.  None currently.  Denies nausea, vomiting, or diarrhea.  No fevers, chills, penile pain, testicular pain, penile discharge, penile swelling, testicular swelling, lesions, rashes.    Past Medical History:  Diagnosis Date  . Chronic pain     Patient Active Problem List   Diagnosis Date Noted  . Dysuria 07/07/2013  . Possible exposure to STD 07/07/2013  . LLQ pain 07/07/2013    Past Surgical History:  Procedure Laterality Date  . ANKLE FRACTURE SURGERY Left   . ORTHOPEDIC SURGERY         Family History  Problem Relation Age of Onset  . Hypertension Mother   . Hypertension Father     Social History   Tobacco Use  . Smoking status: Current Every Day Smoker    Packs/day: 0.50    Types: Cigarettes  . Smokeless tobacco: Never Used  Vaping Use  . Vaping Use: Never used  Substance Use Topics  . Alcohol use: Not Currently    Alcohol/week: 28.0 standard drinks    Types: 28 Cans of beer per week    Comment: daily  . Drug use: No    Home Medications Prior to Admission medications   Medication Sig Start Date End Date Taking? Authorizing Provider  cetirizine (ZYRTEC ALLERGY) 10 MG tablet Take 1 tablet (10 mg total) by mouth daily. 10/10/19   Darr, Gerilyn Pilgrim, PA-C  dicyclomine (BENTYL) 20 MG tablet Take 1 tablet (20 mg total) by mouth 2 (two) times daily. 02/16/20   Mare Ferrari, PA-C   ibuprofen (ADVIL,MOTRIN) 200 MG tablet Take 600-800 mg by mouth every 6 (six) hours as needed for fever, headache, mild pain, moderate pain or cramping.    [provider]  Multiple Vitamin (MULTIVITAMIN WITH MINERALS) TABS tablet Take 1 tablet by mouth daily.    [provider]  fluticasone (FLONASE) 50 MCG/ACT nasal spray Place 2 sprays into both nostrils daily. Patient not taking: Reported on 06/21/2017 12/27/16 07/14/19  Muthersbaugh, Dahlia Client, PA-C    Allergies    Patient has no known allergies.  Review of Systems   Review of Systems  All other systems reviewed and are negative. Ten systems reviewed and are negative for acute change, except as noted in the HPI.   Physical Exam Updated Vital Signs BP 135/84 (BP Location: Left Arm)   Pulse (!) 58   Temp 98.2 F (36.8 C) (Oral)   Resp 17   SpO2 99%   Physical Exam Vitals and nursing note reviewed. Exam conducted with a chaperone present.  Constitutional:      General: He is not in acute distress.    Appearance: He is well-developed.  HENT:     Head: Normocephalic and atraumatic.     Right Ear: External ear normal.     Left Ear: External ear normal.  Eyes:     General: No scleral icterus.  Right eye: No discharge.        Left eye: No discharge.     Conjunctiva/sclera: Conjunctivae normal.  Neck:     Trachea: No tracheal deviation.  Cardiovascular:     Rate and Rhythm: Normal rate.  Pulmonary:     Effort: Pulmonary effort is normal. No respiratory distress.     Breath sounds: No stridor.  Abdominal:     General: Abdomen is flat. There is no distension.     Palpations: Abdomen is soft.     Comments: Abdomen is flat, soft, nontender in all 4 quadrants.  Genitourinary:    Comments: Nursing chaperone present.  Normal-appearing uncircumcised penis.  No penile or testicular swelling.  No tenderness appreciated along the penile shaft, testicles, epididymis bilaterally.  No visible penile discharge.  No  perineal pain.  No rash or lesions. Musculoskeletal:        General: No swelling or deformity.     Cervical back: Neck supple.  Skin:    General: Skin is warm and dry.     Findings: No rash.  Neurological:     Mental Status: He is alert.     Cranial Nerves: Cranial nerve deficit: no gross deficits.     ED Results / Procedures / Treatments   Labs (all labs ordered are listed, but only abnormal results are displayed) Labs Reviewed  COMPREHENSIVE METABOLIC PANEL - Abnormal; Notable for the following components:      Result Value   Glucose, Bld 102 (*)    All other components within normal limits  LIPASE, BLOOD  CBC  URINALYSIS, ROUTINE W REFLEX MICROSCOPIC  HIV ANTIBODY (ROUTINE TESTING W REFLEX)  RAPID HIV SCREEN (HIV 1/2 AB+AG)  GC/CHLAMYDIA PROBE AMP ([place]) NOT AT [facility]    EKG None  Radiology No results found.  Procedures Procedures (including critical care time)  Medications Ordered in ED Medications  cefTRIAXone (ROCEPHIN) injection 500 mg (has no administration in time range)   ED Course  I have reviewed the triage vital signs and the nursing notes.  Pertinent labs & imaging results that were available during my care of the patient were reviewed by me and considered in my medical decision making (see chart for details).    MDM Rules/Calculators/A&P                          Patient is a 59 year old male who presents to the emergency department for STD testing.  Patient had unprotected sexual intercourse 2 weeks ago with a new male partner.  Notes some mild dysuria but otherwise has no acute complaints today.  He noted some intermittent abdominal pain in triage but has none currently.  Patient states he just has some mild abdominal cramping from time to time.  No nausea, vomiting, or diarrhea.  Physical exam is extremely reassuring.  No GU findings.  CBC without leukocytosis.  No elevation in lipase.  UA benign.  CMP reassuring.  GC/chlamydia as well as  HIV testing are all pending.  We will prophylactically treat patient for gonorrhea/chlamydia.  Patient given IM Rocephin in the emergency department and discharged on 1 week of doxycycline.  Discussed safe sex in the future.  Abstaining from sex for the next 1 to 2 weeks.  Recommended going to the health department in the future for STD testing.  His questions were answered and he was amicable at the time of discharge.  Final Clinical Impression(s) / ED Diagnoses Final diagnoses:  Possible exposure to STD  Dysuria    Rx / DC Orders ED Discharge Orders         Ordered    doxycycline (VIBRAMYCIN) 100 MG capsule  2 times daily        06/09/20 1524           Placido Sou, PA-C 06/09/20 1529    Benjiman Core, MD 06/09/20 1621

## 2020-06-09 NOTE — Discharge Instructions (Addendum)
Like we discussed, I am prescribing a medication called doxycycline.  You are to take this twice a day for the next 7 days.  Your STD results should come back in the next 1 to 2 days.  They will notify you if your results are positive.  Please refrain from sexual intercourse for the next 1 to 2 weeks until you have completed your antibiotics.  Please make sure you are using condoms in the future.  You can always return to the emergency department with worsening symptoms.  In the future, please go to the health department for STD testing.  It was a pleasure to meet you.

## 2020-06-10 LAB — GC/CHLAMYDIA PROBE AMP (~~LOC~~) NOT AT ARMC
Chlamydia: NEGATIVE
Comment: NEGATIVE
Comment: NORMAL
Neisseria Gonorrhea: NEGATIVE

## 2020-11-19 ENCOUNTER — Emergency Department (HOSPITAL_COMMUNITY)
Admission: EM | Admit: 2020-11-19 | Discharge: 2020-11-19 | Disposition: A | Discharge status: Home / Self Care | Payer: Medicaid Other | Attending: Emergency Medicine | Admitting: Emergency Medicine

## 2020-11-19 ENCOUNTER — Encounter (HOSPITAL_COMMUNITY): Payer: Self-pay

## 2020-11-19 ENCOUNTER — Other Ambulatory Visit: Payer: Self-pay

## 2020-11-19 DIAGNOSIS — H9202 Otalgia, left ear: Secondary | ICD-10-CM | POA: Diagnosis present

## 2020-11-19 DIAGNOSIS — F17210 Nicotine dependence, cigarettes, uncomplicated: Secondary | ICD-10-CM | POA: Insufficient documentation

## 2020-11-19 DIAGNOSIS — M542 Cervicalgia: Secondary | ICD-10-CM | POA: Diagnosis not present

## 2020-11-19 MED ORDER — CYCLOBENZAPRINE HCL 10 MG PO TABS: 10.0000 mg | ORAL_TABLET | Freq: Two times a day (BID) | ORAL | 0 refills | Status: DC | PRN

## 2020-11-19 NOTE — ED Triage Notes (Signed)
Left ear pain x 2 days. Feels like something flew into his ear and cant really hear from same.

## 2020-11-19 NOTE — ED Provider Notes (Signed)
[facility] EMERGENCY DEPARTMENT Provider Note   CSN: [id number] Arrival date & time: 11/19/20  0636     History Chief Complaint  Patient presents with  . Otalgia    Steve Anderson is a 60 y.o. male.  HPI   Patient with no significant medical history presents to the emergency department with chief complaint of left-sided ear pain.  Patient states ear pain came on suddenly, started yesterday, he was out driving and had the windows down and suspect something might have flew into his ear.  He states the pain is constant, does not radiate, he endorses decreased hearing in that ear, denies any discharge or drainage, he was unable to get anything out of his ear.  He states this has never happened to him in the past.  He has no associated nasal congestion, right-sided ear pain, sore throat cough fevers or chills.  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  He denies any relieving factors.  Patient denies chest pain, shortness of breath, abdominal pain.  Past Medical History:  Diagnosis Date  . Chronic pain     Patient Active Problem List   Diagnosis Date Noted  . Dysuria 07/07/2013  . Possible exposure to STD 07/07/2013  . LLQ pain 07/07/2013    Past Surgical History:  Procedure Laterality Date  . ANKLE FRACTURE SURGERY Left   . ORTHOPEDIC SURGERY         Family History  Problem Relation Age of Onset  . Hypertension Mother   . Hypertension Father     Social History   Tobacco Use  . Smoking status: Current Every Day Smoker    Packs/day: 0.50    Types: Cigarettes  . Smokeless tobacco: Never Used  Vaping Use  . Vaping Use: Never used  Substance Use Topics  . Alcohol use: Not Currently    Alcohol/week: 28.0 standard drinks    Types: 28 Cans of beer per week    Comment: daily  . Drug use: No    Home Medications Prior to Admission medications   Medication Sig Start Date End Date Taking? Authorizing Provider   cyclobenzaprine (FLEXERIL) 10 MG tablet Take 1 tablet (10 mg total) by mouth 2 (two) times daily as needed for muscle spasms. 11/19/20  Yes Carroll Sage, PA-C  cetirizine (ZYRTEC ALLERGY) 10 MG tablet Take 1 tablet (10 mg total) by mouth daily. 10/10/19   Darr, Gerilyn Pilgrim, PA-C  dicyclomine (BENTYL) 20 MG tablet Take 1 tablet (20 mg total) by mouth 2 (two) times daily. 02/16/20   Mare Ferrari, PA-C  ibuprofen (ADVIL,MOTRIN) 200 MG tablet Take 600-800 mg by mouth every 6 (six) hours as needed for fever, headache, mild pain, moderate pain or cramping.    [provider]  Multiple Vitamin (MULTIVITAMIN WITH MINERALS) TABS tablet Take 1 tablet by mouth daily.    [provider]  fluticasone (FLONASE) 50 MCG/ACT nasal spray Place 2 sprays into both nostrils daily. Patient not taking: Reported on 06/21/2017 12/27/16 07/14/19  Muthersbaugh, Dahlia Client, PA-C    Allergies    Patient has no known allergies.  Review of Systems   Review of Systems  Constitutional: Negative for chills and fever.  HENT: Positive for ear pain. Negative for congestion, dental problem, ear discharge, mouth sores, nosebleeds, sinus pressure, sore throat and tinnitus.   Eyes: Negative for visual disturbance.  Respiratory: Negative for shortness of breath.   Cardiovascular: Negative for chest pain.  Gastrointestinal: Negative for abdominal pain.  Genitourinary: Negative for enuresis.  Musculoskeletal: Negative for back pain.  Skin: Negative for rash.  Neurological: Positive for headaches.  Hematological: Does not bruise/bleed easily.    Physical Exam Updated Vital Signs BP 140/85   Pulse 67   Temp 98 F (36.7 C) (Oral)   Resp 16   Ht 5\' 9"  (1.753 m)   Wt 87.5 kg   SpO2 99%   BMI 28.50 kg/m   Physical Exam Vitals and nursing note reviewed.  Constitutional:      General: He is not in acute distress.    Appearance: He is not ill-appearing.  HENT:     Head: Normocephalic and atraumatic.     Right  Ear: Tympanic membrane, ear canal and external ear normal.     Left Ear: Tympanic membrane, ear canal and external ear normal.     Ears:     Comments: No ear protrusion, mastoids were nontender to palpation.    Nose: No congestion.     Mouth/Throat:     Mouth: Mucous membranes are moist.     Pharynx: Oropharynx is clear. No oropharyngeal exudate or posterior oropharyngeal erythema.     Comments: Patient has  numerous dental cavities,gum lines were nontender to palpation, no fluctuance or induration present. Eyes:     Conjunctiva/sclera: Conjunctivae normal.  Neck:     Comments: No acute torticollis present. Cardiovascular:     Rate and Rhythm: Normal rate and regular rhythm.  Pulmonary:     Effort: Pulmonary effort is normal.  Musculoskeletal:     Comments: Neck was palpated patient was notably tender on the left side of his neck, tender along his sternal call mastoid, no tenderness along his cervical spine.   Skin:    General: Skin is warm and dry.  Neurological:     Mental Status: He is alert.  Psychiatric:        Mood and Affect: Mood normal.     ED Results / Procedures / Treatments   Labs (all labs ordered are listed, but only abnormal results are displayed) Labs Reviewed - No data to display  EKG None  Radiology No results found.  Procedures Procedures   Medications Ordered in ED Medications - No data to display  ED Course  I have reviewed the triage vital signs and the nursing notes.  Pertinent labs & imaging results that were available during my care of the patient were reviewed by me and considered in my medical decision making (see chart for details).    MDM Rules/Calculators/A&P                         Initial impression-patient presents with left-sided ear pain.  He is alert, does not appear in acute distress, vital signs reassuring.  Work-up-due to well-appearing patient, benign physical exam, further lab work imaging not warranted at this  time.  Rule out-I have low suspicion for mastoiditis as there is no ear protrusion, mastoids are nontender to palpation.  Low suspicion for otitis externa or media as there is no bulging of the TMs, TMs are nonerythematous, ear canals show no signs of infection.  Low suspicion for URI as patient denies nasal congestion, sore throat, cough fevers or chills.  I have low suspicion for dental abscess as gumlines were palpated they are nontender to palpation, no fluctuance or induration present.  Plan- 1.  Left-sided ear pain-I suspect secondary due to a muscular strain as  she is notably tender on that left side, which radiates into his left ear.  will start him on muscle relaxers, have him follow-up with his PCP for further evaluation.  Vital signs have remained stable, no indication for hospital admission.  Patient given at home care as well strict return precautions.  Patient verbalized that they understood agreed to said plan.   Final Clinical Impression(s) / ED Diagnoses Final diagnoses:  Neck pain    Rx / DC Orders ED Discharge Orders         Ordered    cyclobenzaprine (FLEXERIL) 10 MG tablet  2 times daily PRN        11/19/20 0735           Carroll Sage, PA-C 11/19/20 3220    Tilden Fossa, MD 11/19/20 1007

## 2020-11-19 NOTE — Discharge Instructions (Addendum)
You have been seen here for neck pain.  I have started you on a muscle relaxer please beware this medication can make you drowsy do not consume alcohol or operate heavy machinery when taking this medication.  I recommend taking over-the-counter pain medications like ibuprofen and/or Tylenol every 6 as needed.  Please follow dosage and on the back of bottle.  I also recommend applying heat to the area and stretching out the muscles as this will help decrease stiffness and pain.  I have given you information on exercises please follow.  Please follow-up with your PCP in 1 week's time for reevaluation.  Come back to the emergency department if you develop chest pain, shortness of breath, severe abdominal pain, uncontrolled nausea, vomiting, diarrhea.

## 2020-11-23 ENCOUNTER — Other Ambulatory Visit: Payer: Self-pay

## 2020-11-23 ENCOUNTER — Ambulatory Visit: Payer: Medicaid Other | Admitting: Internal Medicine

## 2020-11-23 ENCOUNTER — Encounter: Payer: Self-pay | Admitting: Internal Medicine

## 2020-11-23 VITALS — BP 120/78 | HR 60 | Resp 12 | Ht 69.5 in | Wt 180.0 lb

## 2020-11-23 DIAGNOSIS — M25572 Pain in left ankle and joints of left foot: Secondary | ICD-10-CM

## 2020-11-23 DIAGNOSIS — M5442 Lumbago with sciatica, left side: Secondary | ICD-10-CM | POA: Diagnosis not present

## 2020-11-23 DIAGNOSIS — R269 Unspecified abnormalities of gait and mobility: Secondary | ICD-10-CM

## 2020-11-23 DIAGNOSIS — Z720 Tobacco use: Secondary | ICD-10-CM

## 2020-11-23 DIAGNOSIS — G8929 Other chronic pain: Secondary | ICD-10-CM

## 2020-11-23 DIAGNOSIS — Z716 Tobacco abuse counseling: Secondary | ICD-10-CM

## 2020-11-23 MED ORDER — NICOTINE POLACRILEX 2 MG MT GUM: 2.0000 mg | CHEWING_GUM | OROMUCOSAL | 2 refills | Status: DC | PRN

## 2020-11-23 MED ORDER — GABAPENTIN 100 MG PO CAPS: ORAL_CAPSULE | ORAL | 3 refills | Status: DC

## 2020-11-23 NOTE — Patient Instructions (Addendum)
If you are going to continue, please use edibles   [facility] [address] [place], [place] [address]  [phone] Hours of Operation Mondays to Thursdays: 8 am to 8 pm Fridays: 9 am to 8 pm Saturdays: 9 am to 1 pm Sundays: Closed  Gabapentin: Start taking Gabapentin 100 mg cap 1 cap at bedtime. In 3 days, increase to 2 caps at bedtime. In another 3 days, increase to 3 caps at bedtime You should be taking 3 caps at bedtime at this point.  In 3 days, start another 1 cap in the morning In 3 more days, increase to 2 caps in the morning In 3 more days, increase to 3 caps in the morning:  You should be taking 3 caps in the morning and 3 caps at bedtime at this point.  In 3 days, start another dose midday--1 cap In 3 more days, increase to 2 caps midday In 3 more days, increase to 3 caps midday. You should be taking 3 caps 3 times daily at this point.  Stay on this dose until follow up If you do not tolerate increasing the dose at any point, hold on the dose you tolerate or call clinic if having problems

## 2020-11-23 NOTE — Progress Notes (Signed)
Subjective:    Patient ID: Steve Anderson, male   DOB: [birth year], 59 y.o.   MRN: [id number]   HPI   Here to establish  1.  Pain in left ankle and low back pain:  Larey Seat off a ladder in 1998 onto concrete--twisted ankle and broke it.  Had ORIF here in [place] at [facility].  Has had chronic pain in left ankle since.   Noted low back pain, more so on left after he had ORIF.   States he did do PT for 6-8 months.  Had to learn to walk all over again.   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   Xrays of low back from same date in 2018 show mild spurring and DDD of L3-L4.  He has been told the hardware in his left leg and ankle cannot be removed.   He is able to swim.  2.  Tobacco Abuse:  3-4 cigarettes daily.  Smokes MJ maybe once daily.  Has smoked since age 82 yo.  Has stopped cold [place] before.    3.  HM  Has received Pfizer x 2 shots, but not booster. Current Meds  Medication Sig   ibuprofen (ADVIL,MOTRIN) 200 MG tablet Take 600-800 mg by mouth every 6 (six) hours as needed for fever, headache, mild pain, moderate pain or cramping.   No Known Allergies  Past Medical History:  Diagnosis Date   Chronic pain     Past Surgical History:  Procedure Laterality Date   ANKLE FRACTURE SURGERY Left    ORTHOPEDIC SURGERY      Family History  Problem Relation Age of Onset   Hypertension Mother    Brain cancer Mother        not clear if actually malignant tumor   Hypertension Father    Cancer Father        Throat    Social History   Socioeconomic History   Marital status: Single    Spouse name: Not on file   Number of children: Not on file   Years of education: Not on file   Highest education level: Not on file  Occupational History   Not on file  Tobacco Use   Smoking status: Every Day    Packs/day: 0.50    Types: Cigarettes   Smokeless tobacco: Never   Vaping Use   Vaping Use: Never used  Substance and Sexual Activity   Alcohol use: Not Currently    Alcohol/week: 28.0 standard drinks    Types: 28 Cans of beer per week    Comment: daily   Drug use: Yes   Sexual activity: Not on file  Other Topics Concern   Not on file  Social History Narrative   Not on file   Social Determinants of Health   Financial Resource Strain: Not on file  Food Insecurity: Not on file  Transportation Needs: Not on file  Physical Activity: Not on file  Stress: Not on file  Social Connections: Not on file  Intimate Partner Violence: Not on file   SDOH:  Some difficulties with rent, but not able to get him to focus   Review of Systems    Objective:   BP 120/78 (BP Location: Left Arm, Patient Position: Sitting, Cuff Size: Normal)   Pulse 60   Resp 12   Ht 5' 9.5" (1.765 m)   Wt 180 lb (81.6 kg)  BMI 26.20 kg/m   Physical Exam NAD HEENT:  PERRL, EOMI, TMs pearly gray, throat without injection Neck:  supple, No adenopathy, no thyromegaly Chest:  CTA CV:  RRR with normal S1 and S2, No S3, S4 or murmur.  No carotid bruit.  Carotid, radial and DP/PT pulses normal and equal Abd:  S, NT, No HSM or mass, + BS Back:  very sensitive to light touch of entire low back musculature as well as LS spinous processes.  Left ankle with bony hypertrophic change and lateral curvature with mild overlying pitting edema.  Limited dorsi and plantar flexion at ankle. Neuro:  A & O x 3, CN  II- XII grossly intact.  DTRs 2+/4 and Motor 5/5 throughout.  Limps favoring left leg.   Assessment & Plan   Chronic low back pain with predominantly left lumbar radiculopathy and left ankle pain:  Encouraged getting into swimming to decrease pain and maintain muscular strength.  Went over starting Gabapentin and titrating to 300 mg 3 times daily.  Follow up in 2 months  2.  HM:  Labs done in December.  Refuses vaccines today, but Tdap and Pfizer booster when follows up.   Fasting labs with follow up in 2 months.    3.  Tobacco/MJ/alcohol use:  encouraged nicotine gum.  Quitline info given.  Encouraged use of edibles instead of smoking if going to use.  Encouraged cutting back on ETOH.

## 2021-01-25 ENCOUNTER — Encounter: Payer: Self-pay | Admitting: Internal Medicine

## 2021-01-25 ENCOUNTER — Other Ambulatory Visit: Payer: Self-pay

## 2021-01-25 ENCOUNTER — Ambulatory Visit (INDEPENDENT_AMBULATORY_CARE_PROVIDER_SITE_OTHER): Payer: Medicaid Other | Admitting: Internal Medicine

## 2021-01-25 ENCOUNTER — Ambulatory Visit: Payer: Self-pay | Admitting: Internal Medicine

## 2021-01-25 VITALS — BP 120/74 | HR 86 | Resp 12 | Ht 69.5 in | Wt 178.0 lb

## 2021-01-25 DIAGNOSIS — M25572 Pain in left ankle and joints of left foot: Secondary | ICD-10-CM

## 2021-01-25 DIAGNOSIS — G8929 Other chronic pain: Secondary | ICD-10-CM | POA: Diagnosis not present

## 2021-01-25 DIAGNOSIS — M5442 Lumbago with sciatica, left side: Secondary | ICD-10-CM | POA: Diagnosis not present

## 2021-01-25 DIAGNOSIS — Z23 Encounter for immunization: Secondary | ICD-10-CM

## 2021-01-25 NOTE — Progress Notes (Signed)
    Subjective:    Patient ID: Steve Anderson, male   DOB: 1961-01-18, 60 y.o.   MRN: [id number]   HPI   Back and ankle pain:  Never obtained gabapentin.  States he did not have the transportation at the time and just has never gone back.  Discussed at length possibilities to get around his transport barriers.  Back continues to be quit painful.  Only taking Ibuprofen as needed.  Did not look into [facility]  Xrays of back from 02/16/2017 at [facility]:   FINDINGS: 5 lumbar segments are normally aligned. Vertebral body heights are normal. Disc spaces are well-maintained. There are no compression fractures or subluxations. Small spurs are present laterally at L3 and L4 superior and inferior endplates,  respectively  2.  HM:  plans for [employer] booster and Tdap today.  Not interested in Shingles vaccine. He did not fast today for fasting labs.  3.  Tobacco abuse:  smoking 2 cigarettes daily.  No interest in quitting.  Does not feel he needs to quit as he does not smoke enough.  Discussed at length.    Current Meds  Medication Sig   ibuprofen (ADVIL,MOTRIN) 200 MG tablet Take 600-800 mg by mouth every 6 (six) hours as needed for fever, headache, mild pain, moderate pain or cramping.   nicotine polacrilex (NICORETTE) 2 MG gum Take 1 each (2 mg total) by mouth as needed for smoking cessation.   No Known Allergies   Review of Systems    Objective:   BP 120/74 (BP Location: Left Arm, Patient Position: Sitting, Cuff Size: Normal)   Pulse 86   Resp 12   Ht 5' 9.5" (1.765 m)   Wt 178 lb (80.7 kg)   BMI 25.91 kg/m   Physical Exam NAD HEENT:  PERRL, EOMI Neck:  Supple, No adenopathy Chest:  CTA CV:  RRR without murmur or rub.  No carotid bruits.  Carotid, radial and DP pulses normal and equal MS:  Tender all over L/S back and spinous processes--jumps with minimal pressure to all areas of low back.  Lateral curvature of lower 1/3 of left tib/fib with bony enlargement of  ankle.  Overlying mild to moderate pitting edema of lower pretib area and ankle.  Walks with limp favoring left ankle with decreased flexion/extension of ankle. Neuro:  Motor 5/5, DTRs 2+/4 throughout LE.  Gait as above.   Assessment & Plan    Chronic low back pain with mainly left lumbar radiculopathy:  again, encouraged getting Gabapentin started--went over titration of med.  Also encouraged swimming and cycling--possibly with recumbent bike to keep ROM and muscle strength and decrease pain.  Xrays of low back to see if any progression of mild DJD.  2.  HM:  Pfizer booster and Tdap today.  Recommended Shingles, but he is not interested.  Return for fasting labs in next month.    3.  Transportation barrier:  he feels he can get his med filled.  Encouraged using his bicycle--[place] easy access to [employer] now.

## 2021-01-25 NOTE — Patient Instructions (Addendum)
Gabapentin: Start taking Gabapentin 100 mg cap 1 cap at bedtime. In 3 days, increase to 2 caps at bedtime. In another 3 days, increase to 3 caps at bedtime You should be taking 3 caps at bedtime at this point.  In 3 days, start another 1 cap in the morning In 3 more days, increase to 2 caps in the morning In 3 more days, increase to 3 caps in the morning:  You should be taking 3 caps in the morning and 3 caps at bedtime at this point.  In 3 days, start another dose midday--1 cap In 3 more days, increase to 2 caps midday In 3 more days, increase to 3 caps midday. You should be taking 3 caps 3 times daily at this point.  Stay on this dose until follow up If you do not tolerate increasing the dose at any point, hold on the dose you tolerate or call clinic if having problems   [facility] [address], [place] [address]  [phone] Hours of Operation Mondays to Thursdays: 8 am to 8 pm Fridays: 9 am to 8 pm Saturdays: 9 am to 1 pm Sundays: Closed   For xray of back:    [facility] [address] [place], [address] [place], [place]  [address]  [phone]

## 2021-02-22 ENCOUNTER — Other Ambulatory Visit: Payer: Medicaid Other

## 2021-04-27 ENCOUNTER — Ambulatory Visit: Payer: Medicaid Other | Admitting: Internal Medicine

## 2021-06-02 ENCOUNTER — Emergency Department (HOSPITAL_COMMUNITY)
Admission: EM | Admit: 2021-06-02 | Discharge: 2021-06-02 | Disposition: A | Discharge status: Home / Self Care | Payer: Medicaid Other | Attending: Emergency Medicine | Admitting: Emergency Medicine

## 2021-06-02 ENCOUNTER — Other Ambulatory Visit: Payer: Self-pay

## 2021-06-02 DIAGNOSIS — Z202 Contact with and (suspected) exposure to infections with a predominantly sexual mode of transmission: Secondary | ICD-10-CM | POA: Insufficient documentation

## 2021-06-02 DIAGNOSIS — N4889 Other specified disorders of penis: Secondary | ICD-10-CM | POA: Insufficient documentation

## 2021-06-02 DIAGNOSIS — F17210 Nicotine dependence, cigarettes, uncomplicated: Secondary | ICD-10-CM | POA: Diagnosis not present

## 2021-06-02 LAB — URINALYSIS, ROUTINE W REFLEX MICROSCOPIC
Bilirubin Urine: NEGATIVE
Glucose, UA: NEGATIVE mg/dL
Hgb urine dipstick: NEGATIVE
Ketones, ur: NEGATIVE mg/dL
Leukocytes,Ua: NEGATIVE
Nitrite: NEGATIVE
Protein, ur: NEGATIVE mg/dL
Specific Gravity, Urine: 1.02 (ref 1.005–1.030)
pH: 6 (ref 5.0–8.0)

## 2021-06-02 LAB — CBC WITH DIFFERENTIAL/PLATELET
Abs Immature Granulocytes: 0.01 10*3/uL (ref 0.00–0.07)
Basophils Absolute: 0 10*3/uL (ref 0.0–0.1)
Basophils Relative: 1 %
Eosinophils Absolute: 0.1 10*3/uL (ref 0.0–0.5)
Eosinophils Relative: 2 %
HCT: 45.4 % (ref 39.0–52.0)
Hemoglobin: 15.5 g/dL (ref 13.0–17.0)
Immature Granulocytes: 0 %
Lymphocytes Relative: 48 %
Lymphs Abs: 2.5 10*3/uL (ref 0.7–4.0)
MCH: 31.3 pg (ref 26.0–34.0)
MCHC: 34.1 g/dL (ref 30.0–36.0)
MCV: 91.5 fL (ref 80.0–100.0)
Monocytes Absolute: 0.5 10*3/uL (ref 0.1–1.0)
Monocytes Relative: 9 %
Neutro Abs: 2.1 10*3/uL (ref 1.7–7.7)
Neutrophils Relative %: 40 %
Platelets: 158 10*3/uL (ref 150–400)
RBC: 4.96 MIL/uL (ref 4.22–5.81)
RDW: 13.5 % (ref 11.5–15.5)
WBC: 5.1 10*3/uL (ref 4.0–10.5)
nRBC: 0 % (ref 0.0–0.2)

## 2021-06-02 LAB — BASIC METABOLIC PANEL
Anion gap: 8 (ref 5–15)
BUN: 9 mg/dL (ref 6–20)
CO2: 21 mmol/L — ABNORMAL LOW (ref 22–32)
Calcium: 9 mg/dL (ref 8.9–10.3)
Chloride: 107 mmol/L (ref 98–111)
Creatinine, Ser: 1.08 mg/dL (ref 0.61–1.24)
GFR, Estimated: >60 mL/min (ref >60)
Glucose, Bld: 100 mg/dL — ABNORMAL HIGH (ref 70–99)
Potassium: 3.9 mmol/L (ref 3.5–5.1)
Sodium: 136 mmol/L (ref 135–145)

## 2021-06-02 MED ORDER — AZITHROMYCIN 250 MG PO TABS
1000.0000 mg | ORAL_TABLET | Freq: Once | ORAL | Status: AC
  Administered 2021-06-02: 1000 mg via ORAL
  Filled 2021-06-02: qty 4

## 2021-06-02 MED ORDER — CEFTRIAXONE SODIUM 500 MG IJ SOLR
500.0000 mg | Freq: Once | INTRAMUSCULAR | Status: AC
  Administered 2021-06-02: 500 mg via INTRAMUSCULAR
  Filled 2021-06-02: qty 500

## 2021-06-02 MED ORDER — LIDOCAINE HCL (PF) 1 % IJ SOLN
1.0000 mL | Freq: Once | INTRAMUSCULAR | Status: AC
  Administered 2021-06-02: 1 mL
  Filled 2021-06-02: qty 5

## 2021-06-02 NOTE — ED Triage Notes (Addendum)
Patient arrives with complaints of abdominal pain and penis discomfort x1 week.  Patient reports diarrhea and burning with urination. No abnormal discharge. States that he did have a new sexual partner.

## 2021-06-02 NOTE — Discharge Instructions (Signed)
Return for any new or worse symptoms.  Antibiotics given here today should take care of any STD exposure.

## 2021-06-02 NOTE — ED Provider Notes (Signed)
[facility] EMERGENCY DEPARTMENT Provider Note   CSN: [id number] Arrival date & time: 06/02/21  [id number]     History Chief Complaint  Patient presents with   Abdominal Pain   Penis Pain    Steve Anderson is a 60 y.o. male.  Patient concerned about STD exposure.  Not really having any symptoms.  Except some burning with urination.  Out in triage stated that the reported some diarrhea but basically denies that to me.  Denies any discharge.  Patient states he has a new sexual partner and is concerned about STD exposure.      Past Medical History:  Diagnosis Date   Chronic pain     Patient Active Problem List   Diagnosis Date Noted   Chronic bilateral low back pain with left-sided sciatica 01/25/2021   Chronic pain of left ankle 01/25/2021   Dysuria 07/07/2013   Possible exposure to STD 07/07/2013   LLQ pain 07/07/2013    Past Surgical History:  Procedure Laterality Date   ANKLE FRACTURE SURGERY Left    ORTHOPEDIC SURGERY         Family History  Problem Relation Age of Onset   Hypertension Mother    Brain cancer Mother        not clear if actually malignant tumor   Hypertension Father    Cancer Father        Throat    Social History   Tobacco Use   Smoking status: Every Day    Packs/day: 0.50    Types: Cigarettes   Smokeless tobacco: Never  Vaping Use   Vaping Use: Never used  Substance Use Topics   Alcohol use: Not Currently    Alcohol/week: 28.0 standard drinks    Types: 28 Cans of beer per week    Comment: daily   Drug use: Yes    Home Medications Prior to Admission medications   Medication Sig Start Date End Date Taking? Authorizing Provider  cetirizine (ZYRTEC ALLERGY) 10 MG tablet Take 1 tablet (10 mg total) by mouth daily. Patient not taking: No sig reported 10/10/19   Darr, Edison Nasuti, PA-C  cyclobenzaprine (FLEXERIL) 10 MG tablet Take 1 tablet (10 mg total) by mouth 2 (two) times daily as needed for muscle spasms. Patient not  taking: No sig reported 11/19/20   Marcello Fennel, PA-C  dicyclomine (BENTYL) 20 MG tablet Take 1 tablet (20 mg total) by mouth 2 (two) times daily. Patient not taking: No sig reported 02/16/20   Sharyn Lull A, PA-C  gabapentin (NEURONTIN) 100 MG capsule 1 cap by mouth at bedtime and increase by 1 cap every 3 days until taking 3 caps 3 times daily Patient not taking: Reported on 01/25/2021 11/23/20   Mack Hook, MD  ibuprofen (ADVIL,MOTRIN) 200 MG tablet Take 600-800 mg by mouth every 6 (six) hours as needed for fever, headache, mild pain, moderate pain or cramping.    [provider]  Multiple Vitamin (MULTIVITAMIN WITH MINERALS) TABS tablet Take 1 tablet by mouth daily. Patient not taking: No sig reported    [provider]  nicotine polacrilex (NICORETTE) 2 MG gum Take 1 each (2 mg total) by mouth as needed for smoking cessation. 11/23/20   Mack Hook, MD  fluticasone (FLONASE) 50 MCG/ACT nasal spray Place 2 sprays into both nostrils daily. Patient not taking: Reported on 06/21/2017 12/27/16 07/14/19  Muthersbaugh, Jarrett Soho, PA-C    Allergies    Patient has no known allergies.  Review of Systems  Review of Systems  Constitutional:  Negative for chills and fever.  HENT:  Negative for ear pain and sore throat.   Eyes:  Negative for pain and visual disturbance.  Respiratory:  Negative for cough and shortness of breath.   Cardiovascular:  Negative for chest pain and palpitations.  Gastrointestinal:  Negative for abdominal pain and vomiting.  Genitourinary:  Positive for dysuria. Negative for flank pain, genital sores, hematuria, penile pain, penile swelling, scrotal swelling and testicular pain.  Musculoskeletal:  Negative for arthralgias and back pain.  Skin:  Negative for color change and rash.  Neurological:  Negative for seizures and syncope.  All other systems reviewed and are negative.  Physical Exam Updated Vital Signs BP 134/74 (BP Location: Left  Arm)    Pulse 73    Temp 98 F (36.7 C) (Oral)    Resp 16    Ht 1.753 m (5\' 9" )    Wt 88.5 kg    SpO2 97%    BMI 28.80 kg/m   Physical Exam Vitals and nursing note reviewed.  Constitutional:      General: He is not in acute distress.    Appearance: He is well-developed.  HENT:     Head: Normocephalic and atraumatic.  Eyes:     General: No scleral icterus.    Conjunctiva/sclera: Conjunctivae normal.  Cardiovascular:     Rate and Rhythm: Normal rate and regular rhythm.     Heart sounds: No murmur heard. Pulmonary:     Effort: Pulmonary effort is normal. No respiratory distress.     Breath sounds: Normal breath sounds.  Abdominal:     Palpations: Abdomen is soft.     Tenderness: There is no abdominal tenderness.  Genitourinary:    Penis: Normal.      Testes: Normal.     Comments: Patient uncircumcised.  No discharge.  No penile lesions.  No groin adenopathy.  No testicular pain no scrotal swelling. Musculoskeletal:        General: No swelling.     Cervical back: Neck supple.  Skin:    General: Skin is warm and dry.     Capillary Refill: Capillary refill takes less than 2 seconds.  Neurological:     General: No focal deficit present.     Mental Status: He is alert and oriented to person, place, and time.  Psychiatric:        Mood and Affect: Mood normal.    ED Results / Procedures / Treatments   Labs (all labs ordered are listed, but only abnormal results are displayed) Labs Reviewed  BASIC METABOLIC PANEL - Abnormal; Notable for the following components:      Result Value   CO2 21 (*)    Glucose, Bld 100 (*)    All other components within normal limits  URINALYSIS, ROUTINE W REFLEX MICROSCOPIC  CBC WITH DIFFERENTIAL/PLATELET  GC/CHLAMYDIA PROBE AMP ([place]) NOT AT [facility]    EKG None  Radiology No results found.  Procedures Procedures   Medications Ordered in ED Medications  cefTRIAXone (ROCEPHIN) injection 500 mg (500 mg Intramuscular Given 06/02/21  1254)  lidocaine (PF) (XYLOCAINE) 1 % injection 1 mL (1 mL Other Given 06/02/21 1255)  azithromycin (ZITHROMAX) tablet 1,000 mg (1,000 mg Oral Given 06/02/21 1254)    ED Course  I have reviewed the triage vital signs and the nursing notes.  Pertinent labs & imaging results that were available during my care of the patient were reviewed by me and considered in my  medical decision making (see chart for details).    MDM Rules/Calculators/A&P                          Patient's GU exam normal.  With patient concerned about STD exposure.  Urinalysis negative.  Urine GC chlamydia probe is pending.  CBC without acute findings.  And basic metabolic panel without significant abnormalities other than a marginally elevated glucose of 100.  Patient will be treated with Rocephin and azithromycin.  For STD prophylaxis.  Patient will return for any new or worse symptoms   Final Clinical Impression(s) / ED Diagnoses Final diagnoses:  Exposure to sexually transmitted disease (STD)    Rx / DC Orders ED Discharge Orders     None        Vanetta Mulders, MD 06/02/21 1318

## 2021-06-17 ENCOUNTER — Emergency Department (HOSPITAL_COMMUNITY)
Admission: EM | Admit: 2021-06-17 | Discharge: 2021-06-17 | Disposition: A | Discharge status: Home / Self Care | Payer: Medicaid Other | Attending: Emergency Medicine | Admitting: Emergency Medicine

## 2021-06-17 ENCOUNTER — Encounter (HOSPITAL_COMMUNITY): Payer: Self-pay | Admitting: Emergency Medicine

## 2021-06-17 ENCOUNTER — Other Ambulatory Visit: Payer: Self-pay

## 2021-06-17 DIAGNOSIS — Z202 Contact with and (suspected) exposure to infections with a predominantly sexual mode of transmission: Secondary | ICD-10-CM | POA: Diagnosis not present

## 2021-06-17 DIAGNOSIS — F17210 Nicotine dependence, cigarettes, uncomplicated: Secondary | ICD-10-CM | POA: Insufficient documentation

## 2021-06-17 DIAGNOSIS — R309 Painful micturition, unspecified: Secondary | ICD-10-CM | POA: Insufficient documentation

## 2021-06-17 LAB — URINALYSIS, ROUTINE W REFLEX MICROSCOPIC
Bacteria, UA: NONE SEEN
Bilirubin Urine: NEGATIVE
Glucose, UA: NEGATIVE mg/dL
Ketones, ur: NEGATIVE mg/dL
Leukocytes,Ua: NEGATIVE
Nitrite: NEGATIVE
Protein, ur: NEGATIVE mg/dL
Specific Gravity, Urine: 1.015 (ref 1.005–1.030)
pH: 5 (ref 5.0–8.0)

## 2021-06-17 LAB — HIV ANTIBODY (ROUTINE TESTING W REFLEX): HIV Screen 4th Generation wRfx: NONREACTIVE

## 2021-06-17 MED ORDER — DOXYCYCLINE HYCLATE 100 MG PO CAPS: 100.0000 mg | ORAL_CAPSULE | Freq: Two times a day (BID) | ORAL | 0 refills | Status: DC

## 2021-06-17 MED ORDER — CEFTRIAXONE SODIUM 500 MG IJ SOLR
500.0000 mg | Freq: Once | INTRAMUSCULAR | Status: AC
  Administered 2021-06-17: 14:00:00 500 mg via INTRAMUSCULAR
  Filled 2021-06-17: qty 500

## 2021-06-17 NOTE — ED Notes (Signed)
Pt left prior to this RN given papers

## 2021-06-17 NOTE — ED Provider Notes (Signed)
Emergency Medicine Provider Triage Evaluation Note  Steve Anderson , a 60 y.o. male  was evaluated in triage.  Pt complains of penile pain and discharge x 3 days. Had unprotected sex with new partner and believes she gave him an STD. Normal bowel movements.   Review of Systems  Positive: Dysuria, penile pain and discharge Negative: Fever, chills, abdominal pain, hematuria  Physical Exam  BP 122/84 (BP Location: Right Arm)    Pulse 77    Temp 97.8 F (36.6 C) (Oral)    Resp 14    SpO2 99%  Gen:   Awake, no distress   Resp:  Normal effort  MSK:   Moves extremities without difficulty  Other:    Medical Decision Making  Medically screening exam initiated at 9:33 AM.  Appropriate orders placed.  Steve Anderson was informed that the remainder of the evaluation will be completed by another provider, this initial triage assessment does not replace that evaluation, and the importance of remaining in the ED until their evaluation is complete.     Steve Anderson 06/17/21 1683    [name], MD 06/17/21 [phone]

## 2021-06-17 NOTE — ED Triage Notes (Signed)
Pt reports new sexual partner, burning with urination, slight abdominal pain, NAD at present.

## 2021-06-17 NOTE — ED Provider Notes (Signed)
[facility] EMERGENCY DEPARTMENT Provider Note   CSN: [id number] Arrival date & time: 06/17/21  0901     History Chief Complaint  Patient presents with   SEXUALLY TRANSMITTED DISEASE    Steve Anderson is a 60 y.o. male.  The history is provided by the patient. No language interpreter was used.   60 year old male presents with concerns of STI.  Patient states he recently had a new sexual partner and had had unprotected sex.  For the past 3 days he endorsed having some burning on urination.  He denies any hematuria or penile discharge and denies any rash.  He denies having fever abdominal pain back pain.  He would like to be tested and treated for potential STI.  Past Medical History:  Diagnosis Date   Chronic pain     Patient Active Problem List   Diagnosis Date Noted   Chronic bilateral low back pain with left-sided sciatica 01/25/2021   Chronic pain of left ankle 01/25/2021   Dysuria 07/07/2013   Possible exposure to STD 07/07/2013   LLQ pain 07/07/2013    Past Surgical History:  Procedure Laterality Date   ANKLE FRACTURE SURGERY Left    ORTHOPEDIC SURGERY         Family History  Problem Relation Age of Onset   Hypertension Mother    Brain cancer Mother        not clear if actually malignant tumor   Hypertension Father    Cancer Father        Throat    Social History   Tobacco Use   Smoking status: Every Day    Packs/day: 0.50    Types: Cigarettes   Smokeless tobacco: Never  Vaping Use   Vaping Use: Never used  Substance Use Topics   Alcohol use: Not Currently    Alcohol/week: 28.0 standard drinks    Types: 28 Cans of beer per week    Comment: daily   Drug use: Yes    Home Medications Prior to Admission medications   Medication Sig Start Date End Date Taking? Authorizing Provider  cetirizine (ZYRTEC ALLERGY) 10 MG tablet Take 1 tablet (10 mg total) by mouth daily. Patient not taking: No sig reported 10/10/19   Darr, Edison Nasuti,  PA-C  cyclobenzaprine (FLEXERIL) 10 MG tablet Take 1 tablet (10 mg total) by mouth 2 (two) times daily as needed for muscle spasms. Patient not taking: No sig reported 11/19/20   Marcello Fennel, PA-C  dicyclomine (BENTYL) 20 MG tablet Take 1 tablet (20 mg total) by mouth 2 (two) times daily. Patient not taking: No sig reported 02/16/20   Sharyn Lull A, PA-C  gabapentin (NEURONTIN) 100 MG capsule 1 cap by mouth at bedtime and increase by 1 cap every 3 days until taking 3 caps 3 times daily Patient not taking: Reported on 01/25/2021 11/23/20   Mack Hook, MD  ibuprofen (ADVIL,MOTRIN) 200 MG tablet Take 600-800 mg by mouth every 6 (six) hours as needed for fever, headache, mild pain, moderate pain or cramping.    [provider]  Multiple Vitamin (MULTIVITAMIN WITH MINERALS) TABS tablet Take 1 tablet by mouth daily. Patient not taking: No sig reported    [provider]  nicotine polacrilex (NICORETTE) 2 MG gum Take 1 each (2 mg total) by mouth as needed for smoking cessation. 11/23/20   Mack Hook, MD  fluticasone (FLONASE) 50 MCG/ACT nasal spray Place 2 sprays into both nostrils daily. Patient not taking: Reported on 06/21/2017  12/27/16 07/14/19  Muthersbaugh, Dahlia Client, PA-C    Allergies    Patient has no known allergies.  Review of Systems   Review of Systems  Constitutional:  Negative for fever.  Genitourinary:  Positive for dysuria. Negative for penile discharge and testicular pain.   Physical Exam Updated Vital Signs BP 130/80    Pulse 69    Temp 97.8 F (36.6 C) (Oral)    Resp 18    Ht 5\' 9"  (1.753 m)    Wt 87.1 kg    SpO2 94%    BMI 28.35 kg/m   Physical Exam Vitals and nursing note reviewed.  Constitutional:      General: He is not in acute distress.    Appearance: He is well-developed.  HENT:     Head: Atraumatic.  Eyes:     Conjunctiva/sclera: Conjunctivae normal.  Abdominal:     Palpations: Abdomen is soft.     Tenderness: There is no  abdominal tenderness.  Genitourinary:    Comments: GU exam deferred per patient request Musculoskeletal:     Cervical back: Neck supple.  Skin:    Findings: No rash.  Neurological:     Mental Status: He is alert.    ED Results / Procedures / Treatments   Labs (all labs ordered are listed, but only abnormal results are displayed) Labs Reviewed  URINALYSIS, ROUTINE W REFLEX MICROSCOPIC - Abnormal; Notable for the following components:      Result Value   Hgb urine dipstick SMALL (*)    All other components within normal limits  RPR  HIV ANTIBODY (ROUTINE TESTING W REFLEX)  GC/CHLAMYDIA PROBE AMP ([place]) NOT AT [facility]    EKG None  Radiology No results found.  Procedures Procedures   Medications Ordered in ED Medications  cefTRIAXone (ROCEPHIN) injection 500 mg (has no administration in time range)    ED Course  I have reviewed the triage vital signs and the nursing notes.  Pertinent labs & imaging results that were available during my care of the patient were reviewed by me and considered in my medical decision making (see chart for details).    MDM Rules/Calculators/A&P                         BP 130/80    Pulse 69    Temp 97.8 F (36.6 C) (Oral)    Resp 18    Ht 5\' 9"  (1.753 m)    Wt 87.1 kg    SpO2 94%    BMI 28.35 kg/m      Final Clinical Impression(s) / ED Diagnoses Final diagnoses:  Possible exposure to STD    Rx / DC Orders ED Discharge Orders     None      12:58 PM Patient report having urinary discomfort after having unprotected sex with a new sexual partner.  Initially in the triage note he reported having penile discharge but this time he did denies having discharge.  We will preemptively treat patient with antibiotic, STI screening performed.   [facility], PA-C 06/17/21 1421    Fayrene Helper, MD 06/18/21 [phone]

## 2021-06-17 NOTE — Discharge Instructions (Addendum)
You have been evaluated for potential sexually transmitted infection.  You can check the result through MyChart, link below.  Take antibiotic as prescribed.  Notify partner to get tested if you test positive for infection.

## 2021-06-18 LAB — RPR: RPR Ser Ql: NONREACTIVE

## 2021-06-21 LAB — GC/CHLAMYDIA PROBE AMP (~~LOC~~) NOT AT ARMC
Chlamydia: NEGATIVE
Comment: NEGATIVE
Comment: NORMAL
Neisseria Gonorrhea: NEGATIVE

## 2021-06-22 ENCOUNTER — Other Ambulatory Visit: Payer: Self-pay

## 2021-06-22 ENCOUNTER — Encounter (HOSPITAL_COMMUNITY): Payer: Self-pay | Admitting: Emergency Medicine

## 2021-06-22 ENCOUNTER — Ambulatory Visit (HOSPITAL_COMMUNITY)
Admission: EM | Admit: 2021-06-22 | Discharge: 2021-06-22 | Disposition: A | Discharge status: Home / Self Care | Payer: Medicaid Other | Attending: Emergency Medicine | Admitting: Emergency Medicine

## 2021-06-22 DIAGNOSIS — R300 Dysuria: Secondary | ICD-10-CM | POA: Insufficient documentation

## 2021-06-22 DIAGNOSIS — R1030 Lower abdominal pain, unspecified: Secondary | ICD-10-CM | POA: Diagnosis present

## 2021-06-22 LAB — POCT URINALYSIS DIPSTICK, ED / UC
Bilirubin Urine: NEGATIVE
Glucose, UA: 100 mg/dL — AB
Hgb urine dipstick: NEGATIVE
Ketones, ur: NEGATIVE mg/dL
Leukocytes,Ua: NEGATIVE
Nitrite: NEGATIVE
Protein, ur: NEGATIVE mg/dL
Specific Gravity, Urine: 1.02 (ref 1.005–1.030)
Urobilinogen, UA: 0.2 mg/dL (ref 0.0–1.0)
pH: 5.5 (ref 5.0–8.0)

## 2021-06-22 NOTE — Discharge Instructions (Addendum)
STI testing in the emergency department was negative, urine urinalysis was also negative for infection  Your urinalysis was negative for infection  We will complete repeat testing today, if positive for gonorrhea, chlamydia, trichomoniasis you will be notified and medication sent to pharmacy  In the meantime you may manage stomach pain with over-the-counter Tylenol and ibuprofen, you declined use of Bentyl which you had used in the past, at any point if symptoms worsen please go to the nearest emergency department for evaluation of imaging, if symptoms continue to persist please follow-up with specialist for further evaluation  If burning with urination occurs it has been recommended that you bfollow-up with urologist for further evaluation

## 2021-06-22 NOTE — ED Triage Notes (Signed)
06/19/2021 onset of symptoms.  Reports abdominal cramping, and burning with urination.  Seen at ED 06/17/2021.  Reports lab testing was done then and received a shot.

## 2021-06-22 NOTE — ED Provider Notes (Signed)
[place]    CSN: [id number] Arrival date & time: 06/22/21  [id number]      History   Chief Complaint Chief Complaint  Patient presents with   Abdominal Pain    HPI Steve Anderson is a 61 y.o. male.   Patient presents with left lower abdominal pain radiating into left flank for 3 days, endorses associated dysuria and frequency. Attest to chronic heartburn, indigestion with bloating.  Not currently taking medications.  Has not attempted treatment of symptoms.  Denies nausea, vomiting, diarrhea or constipation, hematuria, penile discharge, penile or testicle swelling, dietary changes, recent travel. Tested for sti on 12/30, endorses that he was not properly treated.  Sexually active.    Past Medical History:  Diagnosis Date   Chronic pain     Patient Active Problem List   Diagnosis Date Noted   Chronic bilateral low back pain with left-sided sciatica 01/25/2021   Chronic pain of left ankle 01/25/2021   Dysuria 07/07/2013   Possible exposure to STD 07/07/2013   LLQ pain 07/07/2013    Past Surgical History:  Procedure Laterality Date   ANKLE FRACTURE SURGERY Left    ORTHOPEDIC SURGERY         Home Medications    Prior to Admission medications   Medication Sig Start Date End Date Taking? Authorizing Provider  cetirizine (ZYRTEC ALLERGY) 10 MG tablet Take 1 tablet (10 mg total) by mouth daily. Patient not taking: No sig reported 10/10/19   Darr, Edison Nasuti, PA-C  cyclobenzaprine (FLEXERIL) 10 MG tablet Take 1 tablet (10 mg total) by mouth 2 (two) times daily as needed for muscle spasms. Patient not taking: No sig reported 11/19/20   Marcello Fennel, PA-C  dicyclomine (BENTYL) 20 MG tablet Take 1 tablet (20 mg total) by mouth 2 (two) times daily. Patient not taking: No sig reported 02/16/20   Sharyn Lull A, PA-C  doxycycline (VIBRAMYCIN) 100 MG capsule Take 1 capsule (100 mg total) by mouth 2 (two) times daily. One po bid x 7 days Patient not taking: Reported on  06/22/2021 06/17/21   Domenic Moras, PA-C  gabapentin (NEURONTIN) 100 MG capsule 1 cap by mouth at bedtime and increase by 1 cap every 3 days until taking 3 caps 3 times daily Patient not taking: Reported on 01/25/2021 11/23/20   Mack Hook, MD  ibuprofen (ADVIL,MOTRIN) 200 MG tablet Take 600-800 mg by mouth every 6 (six) hours as needed for fever, headache, mild pain, moderate pain or cramping.    [provider]  Multiple Vitamin (MULTIVITAMIN WITH MINERALS) TABS tablet Take 1 tablet by mouth daily. Patient not taking: No sig reported    [provider]  nicotine polacrilex (NICORETTE) 2 MG gum Take 1 each (2 mg total) by mouth as needed for smoking cessation. 11/23/20   Mack Hook, MD  fluticasone (FLONASE) 50 MCG/ACT nasal spray Place 2 sprays into both nostrils daily. Patient not taking: Reported on 06/21/2017 12/27/16 07/14/19  Muthersbaugh, Jarrett Soho, PA-C    Family History Family History  Problem Relation Age of Onset   Hypertension Mother    Brain cancer Mother        not clear if actually malignant tumor   Hypertension Father    Cancer Father        Throat    Social History Social History   Tobacco Use   Smoking status: Every Day    Packs/day: 0.50    Types: Cigarettes   Smokeless tobacco: Never  Vaping Use  Vaping Use: Never used  Substance Use Topics   Alcohol use: Not Currently    Alcohol/week: 28.0 standard drinks    Types: 28 Cans of beer per week    Comment: daily   Drug use: Yes     Allergies   Patient has no known allergies.   Review of Systems Review of Systems  Respiratory: Negative.    Cardiovascular: Negative.   Gastrointestinal:  Positive for abdominal pain. Negative for abdominal distention, anal bleeding, blood in stool, constipation, diarrhea, nausea, rectal pain and vomiting.  Genitourinary:  Positive for dysuria. Negative for decreased urine volume, difficulty urinating, enuresis, flank pain, frequency, genital sores,  hematuria, penile discharge, penile pain, penile swelling, scrotal swelling, testicular pain and urgency.  Skin: Negative.   Neurological: Negative.     Physical Exam Triage Vital Signs ED Triage Vitals  Enc Vitals Group     BP 06/22/21 0858 138/83     Pulse Rate 06/22/21 0858 83     Resp 06/22/21 0858 18     Temp 06/22/21 0858 97.7 F (36.5 C)     Temp Source 06/22/21 0858 Oral     SpO2 06/22/21 0858 97 %     Weight --      Height --      Head Circumference --      Peak Flow --      Pain Score 06/22/21 0856 9     Pain Loc --      Pain Edu? --      Excl. in [place]? --    No data found.  Updated Vital Signs BP 138/83 (BP Location: Left Arm)    Pulse 83    Temp 97.7 F (36.5 C) (Oral)    Resp 18    SpO2 97%   Visual Acuity Right Eye Distance:   Left Eye Distance:   Bilateral Distance:    Right Eye Near:   Left Eye Near:    Bilateral Near:     Physical Exam Constitutional:      Appearance: He is well-developed and normal weight.  Pulmonary:     Effort: Pulmonary effort is normal.  Abdominal:     General: Abdomen is flat. Bowel sounds are normal.     Palpations: Abdomen is soft.     Tenderness: There is abdominal tenderness in the right lower quadrant, suprapubic area and left lower quadrant.  Genitourinary:    Comments: Self collect urethra swab  Skin:    General: Skin is warm and dry.  Neurological:     General: No focal deficit present.     Mental Status: He is alert and oriented to person, place, and time.  Psychiatric:        Mood and Affect: Mood normal.        Behavior: Behavior normal.     UC Treatments / Results  Labs (all labs ordered are listed, but only abnormal results are displayed) Labs Reviewed - No data to display  EKG   Radiology No results found.  Procedures Procedures (including critical care time)  Medications Ordered in UC Medications - No data to display  Initial Impression / Assessment and Plan / UC Course  I have  reviewed the triage vital signs and the nursing notes.  Pertinent labs & imaging results that were available during my care of the patient were reviewed by me and considered in my medical decision making (see chart for details).  Lower abdominal pain Dysuria  Vital signs stable, no signs  of distress, on telephone during majority of exam, tenderness of abdomen is mild, not guarding, recommended conservative treatment, requesting sti recheck, will oblige, labs pending, urinalysis negative, offered bentyl which patient has used in the past, declined, reccommended GI follow up, recommended urology follow up for dysuria as per chart view is frequent concern with negative urine and sti testing  Final Clinical Impressions(s) / UC Diagnoses   Final diagnoses:  None   Discharge Instructions   None    ED Prescriptions   None    PDMP not reviewed this encounter.   Hans Eden, [place] 06/22/21 [phone]

## 2021-06-23 LAB — CYTOLOGY, (ORAL, ANAL, URETHRAL) ANCILLARY ONLY
Chlamydia: NEGATIVE
Comment: NEGATIVE
Comment: NEGATIVE
Comment: NORMAL
Neisseria Gonorrhea: NEGATIVE
Trichomonas: NEGATIVE

## 2021-07-26 ENCOUNTER — Other Ambulatory Visit: Payer: Self-pay

## 2021-07-26 ENCOUNTER — Ambulatory Visit (HOSPITAL_COMMUNITY)
Admission: EM | Admit: 2021-07-26 | Discharge: 2021-07-26 | Disposition: A | Discharge status: Home / Self Care | Payer: Medicaid Other | Attending: Family Medicine | Admitting: Family Medicine

## 2021-07-26 ENCOUNTER — Encounter (HOSPITAL_COMMUNITY): Payer: Self-pay | Admitting: Emergency Medicine

## 2021-07-26 DIAGNOSIS — Z202 Contact with and (suspected) exposure to infections with a predominantly sexual mode of transmission: Secondary | ICD-10-CM | POA: Diagnosis not present

## 2021-07-26 DIAGNOSIS — Z711 Person with feared health complaint in whom no diagnosis is made: Secondary | ICD-10-CM | POA: Insufficient documentation

## 2021-07-26 MED ORDER — CEFTRIAXONE SODIUM 500 MG IJ SOLR
INTRAMUSCULAR | Status: AC
  Filled 2021-07-26: qty 500

## 2021-07-26 MED ORDER — CEFTRIAXONE SODIUM 500 MG IJ SOLR
500.0000 mg | Freq: Once | INTRAMUSCULAR | Status: AC
  Administered 2021-07-26: 500 mg via INTRAMUSCULAR

## 2021-07-26 MED ORDER — LIDOCAINE HCL (PF) 1 % IJ SOLN
INTRAMUSCULAR | Status: AC
  Filled 2021-07-26: qty 2

## 2021-07-26 MED ORDER — DOXYCYCLINE HYCLATE 100 MG PO CAPS: 100.0000 mg | ORAL_CAPSULE | Freq: Two times a day (BID) | ORAL | 0 refills | Status: DC

## 2021-07-26 NOTE — Discharge Instructions (Addendum)

## 2021-07-26 NOTE — ED Provider Notes (Signed)
[facility]   [id number] 07/26/21 Arrival Time: 0902  ASSESSMENT & PLAN:  1. Concern about STD in male without diagnosis       Discharge Instructions      You have been given the following today for treatment of suspected gonorrhea and/or chlamydia:  cefTRIAXone (ROCEPHIN) injection 500 mg  Please pick up your prescription for doxycycline 100 mg and begin taking twice daily for the next seven (7) days.  Even though we have treated you today, we have sent testing for sexually transmitted infections. We will notify you of any positive results once they are received. If required, we will prescribe any medications you might need.  Please refrain from all sexual activity for at least the next seven days.     Pending: Labs Reviewed  CYTOLOGY, (ORAL, ANAL, URETHRAL) ANCILLARY ONLY    Will notify of any positive results. Instructed to refrain from sexual activity for at least seven days.  Reviewed expectations re: course of current medical issues. Questions answered. Outlined signs and symptoms indicating need for more acute intervention. Patient verbalized understanding. After Visit Summary given.   SUBJECTIVE:  Steve Anderson is a 61 y.o. male who presents with complaint of penile "irritation"; 1-2 d. Questions some discharge. Worried re: STD. No specific aggravating or alleviating factors reported. Denies: urinary frequency, dysuria, and gross hematuria. Afebrile. No abdominal or pelvic pain. No n/v. No rashes or lesions. Reports that he is sexually active.  OBJECTIVE:  Vitals:   07/26/21 1055  BP: 130/81  Pulse: 64  Resp: 18  Temp: 97.7 F (36.5 C)  TempSrc: Oral  SpO2: 97%     General appearance: alert, cooperative, appears stated age and no distress Throat: lips, mucosa, and tongue normal; teeth and gums normal Lungs: unlabored respirations; speaks full sentences without difficulty Back: no CVA tenderness; FROM at waist Abdomen: soft,  non-tender GU: deferred Skin: warm and dry Psychological: alert and cooperative; normal mood and affect.  Results for orders placed or performed during the hospital encounter of 06/22/21  POC Urinalysis dipstick  Result Value Ref Range   Glucose, UA 100 (A) NEGATIVE mg/dL   Bilirubin Urine NEGATIVE NEGATIVE   Ketones, ur NEGATIVE NEGATIVE mg/dL   Specific Gravity, Urine 1.020 1.005 - 1.030   Hgb urine dipstick NEGATIVE NEGATIVE   pH 5.5 5.0 - 8.0   Protein, ur NEGATIVE NEGATIVE mg/dL   Urobilinogen, UA 0.2 0.0 - 1.0 mg/dL   Nitrite NEGATIVE NEGATIVE   Leukocytes,Ua NEGATIVE NEGATIVE  Cytology (oral, anal, urethral) ancillary only  Result Value Ref Range   Neisseria Gonorrhea Negative    Chlamydia Negative    Trichomonas Negative    Comment Normal Reference Range Trichomonas - Negative    Comment Normal Reference Ranger Chlamydia - Negative    Comment      Normal Reference Range Neisseria Gonorrhea - Negative    Labs Reviewed  CYTOLOGY, (ORAL, ANAL, URETHRAL) ANCILLARY ONLY    No Known Allergies  Past Medical History:  Diagnosis Date   Chronic pain    Family History  Problem Relation Age of Onset   Hypertension Mother    Brain cancer Mother        not clear if actually malignant tumor   Hypertension Father    Cancer Father        Throat   Social History   Socioeconomic History   Marital status: Single    Spouse name: Not on file   Number of children: Not on file  Years of education: Not on file   Highest education level: Not on file  Occupational History   Not on file  Tobacco Use   Smoking status: Every Day    Packs/day: 0.50    Types: Cigarettes   Smokeless tobacco: Never  Vaping Use   Vaping Use: Never used  Substance and Sexual Activity   Alcohol use: Not Currently    Alcohol/week: 28.0 standard drinks    Types: 28 Cans of beer per week    Comment: daily   Drug use: Yes   Sexual activity: Yes  Other Topics Concern   Not on file  Social  History Narrative   Not on file   Social Determinants of Health   Financial Resource Strain: Not on file  Food Insecurity: Not on file  Transportation Needs: Not on file  Physical Activity: Not on file  Stress: Not on file  Social Connections: Not on file  Intimate Partner Violence: Not on file           Vanessa Kick, MD 07/26/21 1137

## 2021-07-26 NOTE — ED Triage Notes (Signed)
Patient burning from penis, no deischarge.

## 2021-07-27 LAB — CYTOLOGY, (ORAL, ANAL, URETHRAL) ANCILLARY ONLY
Chlamydia: NEGATIVE
Comment: NEGATIVE
Comment: NEGATIVE
Comment: NORMAL
Neisseria Gonorrhea: NEGATIVE
Trichomonas: NEGATIVE

## 2021-08-08 ENCOUNTER — Emergency Department (HOSPITAL_COMMUNITY): Payer: Medicaid Other

## 2021-08-08 ENCOUNTER — Encounter (HOSPITAL_COMMUNITY): Payer: Self-pay | Admitting: Emergency Medicine

## 2021-08-08 ENCOUNTER — Emergency Department (HOSPITAL_COMMUNITY)
Admission: EM | Admit: 2021-08-08 | Discharge: 2021-08-08 | Disposition: A | Discharge status: Home / Self Care | Payer: Medicaid Other | Attending: Emergency Medicine | Admitting: Emergency Medicine

## 2021-08-08 DIAGNOSIS — R1084 Generalized abdominal pain: Secondary | ICD-10-CM | POA: Diagnosis not present

## 2021-08-08 DIAGNOSIS — R197 Diarrhea, unspecified: Secondary | ICD-10-CM | POA: Diagnosis not present

## 2021-08-08 DIAGNOSIS — R7309 Other abnormal glucose: Secondary | ICD-10-CM | POA: Diagnosis not present

## 2021-08-08 DIAGNOSIS — R1030 Lower abdominal pain, unspecified: Secondary | ICD-10-CM | POA: Diagnosis present

## 2021-08-08 LAB — CBC
HCT: 45.4 % (ref 39.0–52.0)
Hemoglobin: 15.3 g/dL (ref 13.0–17.0)
MCH: 30.7 pg (ref 26.0–34.0)
MCHC: 33.7 g/dL (ref 30.0–36.0)
MCV: 91.2 fL (ref 80.0–100.0)
Platelets: 183 10*3/uL (ref 150–400)
RBC: 4.98 MIL/uL (ref 4.22–5.81)
RDW: 13.4 % (ref 11.5–15.5)
WBC: 4.9 10*3/uL (ref 4.0–10.5)
nRBC: 0 % (ref 0.0–0.2)

## 2021-08-08 LAB — COMPREHENSIVE METABOLIC PANEL
ALT: 14 U/L (ref 0–44)
AST: 18 U/L (ref 15–41)
Albumin: 3.9 g/dL (ref 3.5–5.0)
Alkaline Phosphatase: 48 U/L (ref 38–126)
Anion gap: 7 (ref 5–15)
BUN: 11 mg/dL (ref 6–20)
CO2: 26 mmol/L (ref 22–32)
Calcium: 9 mg/dL (ref 8.9–10.3)
Chloride: 107 mmol/L (ref 98–111)
Creatinine, Ser: 1.19 mg/dL (ref 0.61–1.24)
GFR, Estimated: >60 mL/min (ref >60)
Glucose, Bld: 135 mg/dL — ABNORMAL HIGH (ref 70–99)
Potassium: 3.8 mmol/L (ref 3.5–5.1)
Sodium: 140 mmol/L (ref 135–145)
Total Bilirubin: 0.7 mg/dL (ref 0.3–1.2)
Total Protein: 6.8 g/dL (ref 6.5–8.1)

## 2021-08-08 LAB — URINALYSIS, ROUTINE W REFLEX MICROSCOPIC
Bilirubin Urine: NEGATIVE
Glucose, UA: NEGATIVE mg/dL
Hgb urine dipstick: NEGATIVE
Ketones, ur: NEGATIVE mg/dL
Leukocytes,Ua: NEGATIVE
Nitrite: NEGATIVE
Protein, ur: NEGATIVE mg/dL
Specific Gravity, Urine: 1.014 (ref 1.005–1.030)
pH: 6 (ref 5.0–8.0)

## 2021-08-08 LAB — LIPASE, BLOOD: Lipase: 43 U/L (ref 11–51)

## 2021-08-08 MED ORDER — LOPERAMIDE HCL 2 MG PO CAPS: 2.0000 mg | ORAL_CAPSULE | Freq: Four times a day (QID) | ORAL | 0 refills | Status: DC | PRN

## 2021-08-08 MED ORDER — IOHEXOL 300 MG/ML  SOLN
100.0000 mL | Freq: Once | INTRAMUSCULAR | Status: AC | PRN
  Administered 2021-08-08: 100 mL via INTRAVENOUS

## 2021-08-08 MED ORDER — DICYCLOMINE HCL 20 MG PO TABS
20.0000 mg | ORAL_TABLET | Freq: Two times a day (BID) | ORAL | 0 refills | Status: DC | PRN
Start: 2021-08-08 — End: 2021-10-11

## 2021-08-08 MED ORDER — DICYCLOMINE HCL 10 MG PO CAPS
20.0000 mg | ORAL_CAPSULE | Freq: Once | ORAL | Status: AC
  Administered 2021-08-08: 20 mg via ORAL
  Filled 2021-08-08: qty 2

## 2021-08-08 NOTE — ED Triage Notes (Signed)
Patient complains of diarrhea and intermittent LLQ abdominal pain that started four days ago. Denies emesis. Patient is alert, oriented, ambulatory, and in no apparent distress at this time.

## 2021-08-08 NOTE — Discharge Instructions (Addendum)
Likely you have a viral GI bug, I recommend a bland diet, given you Bentyl for stomach spasms, Imodium for diarrhea.  Please stay hydrated.  Follow-up with PCP as needed.  Come back to the emergency department if you develop chest pain, shortness of breath, severe abdominal pain, uncontrolled nausea, vomiting, diarrhea.

## 2021-08-08 NOTE — ED Provider Notes (Signed)
[facility] EMERGENCY DEPARTMENT Provider Note   CSN: [id number] Arrival date & time: 08/08/21  1132     History  Chief Complaint  Patient presents with   Abdominal Pain    Steve Anderson is a 61 y.o. male.  HPI  Patient without significant medical history presents with complaints of lower abdominal tenderness.  Patient states this started about 4 days ago, came on suddenly, describes as a constant like dull sensation, slightly improved with bowel movements but denies p.o. intake or movement increase or decrease in pain.  He has associated diarrhea denies melena or hematochezia, no nausea or vomiting, no urinary symptoms, he has no fevers or chills or URI-like symptoms.  He denies excessive alcohol use, denies any NSAID use, no history of stomach ulcers or bowel obstructions he has no significant abdominal history.  Patient's only complaint at this time.  Reviewed patient's chart was seen here 1 year ago for similar presentation CT imaging at that time was unremarkable for acute findings.  Home Medications Prior to Admission medications   Medication Sig Start Date End Date Taking? Authorizing Provider  acetaminophen (TYLENOL) 500 MG tablet Take 1,000 mg by mouth every 6 (six) hours as needed for mild pain or headache.   Yes [provider]  dicyclomine (BENTYL) 20 MG tablet Take 1 tablet (20 mg total) by mouth 2 (two) times daily as needed for spasms. 08/08/21  Yes Marcello Fennel, PA-C  doxycycline (VIBRAMYCIN) 100 MG capsule Take 1 capsule (100 mg total) by mouth 2 (two) times daily. Patient taking differently: Take 100 mg by mouth 2 (two) times daily. For 7 days 07/26/21  Yes Hagler, Aaron Edelman, MD  loperamide (IMODIUM) 2 MG capsule Take 1 capsule (2 mg total) by mouth 4 (four) times daily as needed for diarrhea or loose stools. 08/08/21  Yes Marcello Fennel, PA-C  fluticasone (FLONASE) 50 MCG/ACT nasal spray Place 2 sprays into both nostrils  daily. Patient not taking: Reported on 06/21/2017 12/27/16 07/14/19  Muthersbaugh, Jarrett Soho, PA-C      Allergies    Patient has no known allergies.    Review of Systems   Review of Systems  Constitutional:  Negative for chills and fever.  Respiratory:  Negative for shortness of breath.   Cardiovascular:  Negative for chest pain.  Gastrointestinal:  Positive for abdominal pain and diarrhea. Negative for blood in stool, nausea and vomiting.  Neurological:  Negative for headaches.   Physical Exam Updated Vital Signs BP 133/84    Pulse (!) 56    Temp 98.4 F (36.9 C) (Oral)    Resp 18    SpO2 99%  Physical Exam Vitals and nursing note reviewed.  Constitutional:      General: He is not in acute distress.    Appearance: He is not ill-appearing.  HENT:     Head: Normocephalic and atraumatic.     Nose: No congestion.  Eyes:     Conjunctiva/sclera: Conjunctivae normal.  Cardiovascular:     Rate and Rhythm: Normal rate and regular rhythm.     Pulses: Normal pulses.     Heart sounds: No murmur heard.   No friction rub. No gallop.  Pulmonary:     Effort: No respiratory distress.     Breath sounds: No wheezing, rhonchi or rales.  Abdominal:     General: There is no distension.     Palpations: Abdomen is soft.     Tenderness: There is abdominal tenderness. There is no right  CVA tenderness or left CVA tenderness.     Comments: Abdomen nondistended, normal bowel sounds, dull to percussion, he has none focalized tenderness, but seems more localized within the umbilical region there is no guarding, rebound tenderness, peritoneal sign negative Murphy sign McBurney point.  Musculoskeletal:     Right lower leg: No edema.     Left lower leg: No edema.  Skin:    General: Skin is warm and dry.  Neurological:     Mental Status: He is alert.  Psychiatric:        Mood and Affect: Mood normal.    ED Results / Procedures / Treatments   Labs (all labs ordered are listed, but only abnormal results  are displayed) Labs Reviewed  COMPREHENSIVE METABOLIC PANEL - Abnormal; Notable for the following components:      Result Value   Glucose, Bld 135 (*)    All other components within normal limits  LIPASE, BLOOD  CBC  URINALYSIS, ROUTINE W REFLEX MICROSCOPIC    EKG None  Radiology CT Abdomen Pelvis W Contrast  Result Date: 08/08/2021 CLINICAL DATA:  Abdominal pain, acute, nonlocalized EXAM: CT ABDOMEN AND PELVIS WITH CONTRAST TECHNIQUE: Multidetector CT imaging of the abdomen and pelvis was performed using the standard protocol following bolus administration of intravenous contrast. RADIATION DOSE REDUCTION: This exam was performed according to the departmental dose-optimization program which includes automated exposure control, adjustment of the mA and/or kV according to patient size and/or use of iterative reconstruction technique. CONTRAST:  187mL OMNIPAQUE IOHEXOL 300 MG/ML  SOLN COMPARISON:  02/16/2020 FINDINGS: Lower chest: No acute abnormality. Hepatobiliary: Too small to characterize low-density lesion of the left lobe. Gallbladder is contracted. No biliary dilatation. Pancreas: Unremarkable. Spleen: Unremarkable. Adrenals/Urinary Tract: Adrenals, kidneys, and bladder are unremarkable. Stomach/Bowel: Stomach is within normal limits. Bowel is normal in caliber. Normal appendix. Vascular/Lymphatic: Atherosclerosis.  No enlarged nodes. Reproductive: Stable appearance of prostate. Other: No free fluid.  Abdominal wall is unremarkable. 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 IMPRESSION: No acute abnormality. Electronically Signed   By: Macy Mis M.D.   On: 08/08/2021 15:52    Procedures Procedures    Medications Ordered in ED Medications  dicyclomine (BENTYL) capsule 20 mg (20 mg Oral Given 08/08/21 1420)  iohexol (OMNIPAQUE) 300 MG/ML solution 100 mL (100 mLs Intravenous Contrast Given 08/08/21  1541)    ED Course/ Medical Decision Making/ A&P                           Medical Decision Making Amount and/or Complexity of Data Reviewed Labs: ordered. Radiology: ordered.  Risk Prescription drug management.   This patient presents to the ED for concern of abdominal pain, this involves an extensive number of treatment options, and is a complaint that carries with it a high risk of complications and morbidity.  The differential diagnosis includes dissection, bowel obstruction, diverticulitis, gastritis,    Additional history obtained:  Additional history obtained from electronic medical record External records from outside source obtained and reviewed including please see HPI for further detail   Co morbidities that complicate the patient evaluation  N/A  Social Determinants of Health:  N/A    Lab Tests:  I Ordered, and personally interpreted labs.  The pertinent results include: CBC unremarkable, CMP shows glucose of 135, lipase 43, UA unremarkable.   Imaging Studies ordered:  I ordered imaging studies including CT abdomen pelvis  I independently visualized and interpreted imaging which showed negative for acute findings I agree with the radiologist interpretation   Cardiac Monitoring:  The patient was maintained on a cardiac monitor.  I personally viewed and interpreted the cardiac monitored which showed an underlying rhythm of: N/A   Medicines ordered and prescription drug management:  I ordered medication including Bentyl for stomach pain I have reviewed the patients home medicines and have made adjustments as needed   Reevaluation:  On initial evaluation patient had some abdominal tenderness, provided with Bentyl, will also obtain CT abdomen pelvis as I am concerned for possible diverticulitis.   Reassessed after Bentyl, states pain has resolved, vital signs remained stable, he is agreeable for discharge.   Rule out Low suspicion for systemic  infection as patient does not meet sepsis or SIRS criteria.  I have low suspicion for dissection as presentation atypical etiology, pain does not radiate to his back, not a tearing or stabbing-like sensation, patient has low risk factors.  I have low suspicion for lower lobe pneumonia as lung sounds are clear bilaterally.  Low suspicion for UTI, pyelo-, kidney stone denies any urinary symptoms, UA is negative for nitrates leukocytes or hematuria.  Low suspicion for intra-abdominal infection, bowel obstruction, volvulus, diverticulitis, CT imaging negative for these findings.    Dispostion and problem list  After consideration of the diagnostic results and the patients response to treatment, I feel that the patent would benefit from discharge.  Stomach pain-suspect likely viral nature, will provide him with Bentyl, antidiarrheal recommend bowel rest follow-up with PCP as needed.  Given strict return precautions.             Final Clinical Impression(s) / ED Diagnoses Final diagnoses:  Generalized abdominal pain    Rx / DC Orders ED Discharge Orders          Ordered    loperamide (IMODIUM) 2 MG capsule  4 times daily PRN        08/08/21 1634    dicyclomine (BENTYL) 20 MG tablet  2 times daily PRN        08/08/21 1634              Marcello Fennel, PA-C 08/08/21 1636    Regan Lemming, MD 08/08/21 1902

## 2021-08-08 NOTE — ED Notes (Signed)
Patient transported to CT 

## 2021-08-19 ENCOUNTER — Encounter (HOSPITAL_COMMUNITY): Payer: Self-pay | Admitting: *Deleted

## 2021-08-19 ENCOUNTER — Emergency Department (HOSPITAL_COMMUNITY)
Admission: EM | Admit: 2021-08-19 | Discharge: 2021-08-19 | Disposition: A | Discharge status: Home / Self Care | Payer: Medicaid Other | Attending: Emergency Medicine | Admitting: Emergency Medicine

## 2021-08-19 ENCOUNTER — Other Ambulatory Visit: Payer: Self-pay

## 2021-08-19 ENCOUNTER — Emergency Department (HOSPITAL_COMMUNITY): Payer: Medicaid Other

## 2021-08-19 DIAGNOSIS — R197 Diarrhea, unspecified: Secondary | ICD-10-CM | POA: Insufficient documentation

## 2021-08-19 DIAGNOSIS — R300 Dysuria: Secondary | ICD-10-CM | POA: Insufficient documentation

## 2021-08-19 DIAGNOSIS — Z20822 Contact with and (suspected) exposure to covid-19: Secondary | ICD-10-CM | POA: Diagnosis not present

## 2021-08-19 DIAGNOSIS — R1030 Lower abdominal pain, unspecified: Secondary | ICD-10-CM | POA: Insufficient documentation

## 2021-08-19 LAB — COMPREHENSIVE METABOLIC PANEL
ALT: 12 U/L (ref 0–44)
AST: 16 U/L (ref 15–41)
Albumin: 3.9 g/dL (ref 3.5–5.0)
Alkaline Phosphatase: 63 U/L (ref 38–126)
Anion gap: 7 (ref 5–15)
BUN: 12 mg/dL (ref 6–20)
CO2: 26 mmol/L (ref 22–32)
Calcium: 9.2 mg/dL (ref 8.9–10.3)
Chloride: 105 mmol/L (ref 98–111)
Creatinine, Ser: 1.07 mg/dL (ref 0.61–1.24)
GFR, Estimated: >60 mL/min (ref >60)
Glucose, Bld: 107 mg/dL — ABNORMAL HIGH (ref 70–99)
Potassium: 4.3 mmol/L (ref 3.5–5.1)
Sodium: 138 mmol/L (ref 135–145)
Total Bilirubin: 0.5 mg/dL (ref 0.3–1.2)
Total Protein: 7.1 g/dL (ref 6.5–8.1)

## 2021-08-19 LAB — HIV ANTIBODY (ROUTINE TESTING W REFLEX): HIV Screen 4th Generation wRfx: NONREACTIVE

## 2021-08-19 LAB — CBC WITH DIFFERENTIAL/PLATELET
Abs Immature Granulocytes: 0.01 10*3/uL (ref 0.00–0.07)
Basophils Absolute: 0 10*3/uL (ref 0.0–0.1)
Basophils Relative: 1 %
Eosinophils Absolute: 0.1 10*3/uL (ref 0.0–0.5)
Eosinophils Relative: 1 %
HCT: 46.6 % (ref 39.0–52.0)
Hemoglobin: 15.7 g/dL (ref 13.0–17.0)
Immature Granulocytes: 0 %
Lymphocytes Relative: 49 %
Lymphs Abs: 3.2 10*3/uL (ref 0.7–4.0)
MCH: 30.8 pg (ref 26.0–34.0)
MCHC: 33.7 g/dL (ref 30.0–36.0)
MCV: 91.6 fL (ref 80.0–100.0)
Monocytes Absolute: 0.4 10*3/uL (ref 0.1–1.0)
Monocytes Relative: 7 %
Neutro Abs: 2.7 10*3/uL (ref 1.7–7.7)
Neutrophils Relative %: 42 %
Platelets: 182 10*3/uL (ref 150–400)
RBC: 5.09 MIL/uL (ref 4.22–5.81)
RDW: 13.1 % (ref 11.5–15.5)
WBC: 6.4 10*3/uL (ref 4.0–10.5)
nRBC: 0 % (ref 0.0–0.2)

## 2021-08-19 LAB — URINALYSIS, ROUTINE W REFLEX MICROSCOPIC
Bilirubin Urine: NEGATIVE
Glucose, UA: NEGATIVE mg/dL
Hgb urine dipstick: NEGATIVE
Ketones, ur: NEGATIVE mg/dL
Leukocytes,Ua: NEGATIVE
Nitrite: NEGATIVE
Protein, ur: NEGATIVE mg/dL
Specific Gravity, Urine: 1.013 (ref 1.005–1.030)
pH: 5 (ref 5.0–8.0)

## 2021-08-19 LAB — RESP PANEL BY RT-PCR (FLU A&B, COVID) ARPGX2
Influenza A by PCR: NEGATIVE
Influenza B by PCR: NEGATIVE
SARS Coronavirus 2 by RT PCR: NEGATIVE

## 2021-08-19 LAB — LIPASE, BLOOD: Lipase: 64 U/L — ABNORMAL HIGH (ref 11–51)

## 2021-08-19 MED ORDER — IOHEXOL 350 MG/ML SOLN
75.0000 mL | Freq: Once | INTRAVENOUS | Status: AC | PRN
  Administered 2021-08-19: 75 mL via INTRAVENOUS

## 2021-08-19 MED ORDER — CEFTRIAXONE SODIUM 500 MG IJ SOLR
500.0000 mg | Freq: Once | INTRAMUSCULAR | Status: AC
Start: 2021-08-19 — End: 2021-08-19
  Administered 2021-08-19: 500 mg via INTRAMUSCULAR
  Filled 2021-08-19: qty 500

## 2021-08-19 MED ORDER — DOXYCYCLINE HYCLATE 100 MG PO CAPS: 100.0000 mg | ORAL_CAPSULE | Freq: Two times a day (BID) | ORAL | 0 refills | Status: AC

## 2021-08-19 MED ORDER — DOXYCYCLINE HYCLATE 100 MG PO TABS
100.0000 mg | ORAL_TABLET | Freq: Once | ORAL | Status: AC
  Administered 2021-08-19: 100 mg via ORAL
  Filled 2021-08-19: qty 1

## 2021-08-19 NOTE — ED Triage Notes (Addendum)
Pt reports LLQ pain for several days, having diarrhea. Denies vomiting or blood in stools. Denies pain with urination. Was here recently for similar symptoms. Reports being concerned that he possibly has been exposed to std.  ?

## 2021-08-19 NOTE — ED Provider Triage Note (Signed)
Emergency Medicine Provider Triage Evaluation Note ? ?Steve Anderson , a 61 y.o. male  was evaluated in triage.  Pt complains of abdominal pain and diarrhea for multiple days.  Denies blood in stool.  No nausea or vomiting.  Also concerned about an STD.  Endorsing penile pain.  Denies any discharge or blood in the meatus.  He says he has had multiple sexual partners since his visit for STD testing on 2/7. ? ?No history of surgeries on the abdomen.  Reports only occasional alcohol use. ? ?Review of Systems  ?See above ? ?Physical Exam  ?BP (!) 140/93 (BP Location: Right Arm)   Pulse 74   Temp 97.6 ?F (36.4 ?C) (Oral)   Resp 15   SpO2 94%  ?Gen:   Awake, no distress   ?Resp:  Normal effort  ?MSK:   Moves extremities without difficulty  ?Other:  Left upper quadrant tenderness. ? ?Medical Decision Making  ?Medically screening exam initiated at 9:37 AM.  Appropriate orders placed.  Steve Anderson was informed that the remainder of the evaluation will be completed by another provider, this initial triage assessment does not replace that evaluation, and the importance of remaining in the ED until their evaluation is complete. ? ? ?  ?Saddie Benders, PA-C ?08/19/21 3299 ? ?

## 2021-08-19 NOTE — ED Provider Notes (Signed)
? ?[facility] EMERGENCY DEPARTMENT  ?Provider Note ? ?CSN: [id number] ?Arrival date & time: 08/19/21 4496 ? ?History ?Chief Complaint  ?Patient presents with  ? Abdominal Pain  ? Diarrhea  ? ? ?Steve Anderson is a 61 y.o. male who reports lower abdominal pain for several days. He is also concerned he may have been exposed to STI recently. Reports some burning with urination but no penile discharge. He has had some diarrhea. Seen for similar symptoms multiple times in recent weeks but he reports a repeat exposure via unprotected sex since his last test. No fever. No blood in stool.  ? ? ?Home Medications ?Prior to Admission medications   ?Medication Sig Start Date End Date Taking? Authorizing Provider  ?acetaminophen (TYLENOL) 500 MG tablet Take 1,000 mg by mouth every 6 (six) hours as needed for mild pain or headache.    [provider]  ?dicyclomine (BENTYL) 20 MG tablet Take 1 tablet (20 mg total) by mouth 2 (two) times daily as needed for spasms. 08/08/21   Carroll Sage, PA-C  ?doxycycline (VIBRAMYCIN) 100 MG capsule Take 1 capsule (100 mg total) by mouth 2 (two) times daily for 7 days. 08/19/21 08/26/21  Pollyann Savoy, MD  ?loperamide (IMODIUM) 2 MG capsule Take 1 capsule (2 mg total) by mouth 4 (four) times daily as needed for diarrhea or loose stools. 08/08/21   Carroll Sage, PA-C  ?fluticasone (FLONASE) 50 MCG/ACT nasal spray Place 2 sprays into both nostrils daily. ?Patient not taking: Reported on 06/21/2017 12/27/16 07/14/19  Muthersbaugh, Dahlia Client, PA-C  ? ? ? ?Allergies    ?Patient has no known allergies. ? ? ?Review of Systems   ?Review of Systems ?Please see HPI for pertinent positives and negatives ? ?Physical Exam ?BP (!) 152/77 (BP Location: Left Arm)   Pulse 70   Temp 97.8 ?F (36.6 ?C) (Oral)   Resp 18   SpO2 100%  ? ?Physical Exam ?Vitals and nursing note reviewed.  ?Constitutional:   ?   Appearance: Normal appearance.  ?HENT:  ?   Head: Normocephalic and  atraumatic.  ?   Nose: Nose normal.  ?   Mouth/Throat:  ?   Mouth: Mucous membranes are moist.  ?Eyes:  ?   Extraocular Movements: Extraocular movements intact.  ?   Conjunctiva/sclera: Conjunctivae normal.  ?Cardiovascular:  ?   Rate and Rhythm: Normal rate.  ?Pulmonary:  ?   Effort: Pulmonary effort is normal.  ?   Breath sounds: Normal breath sounds.  ?Abdominal:  ?   General: Abdomen is flat.  ?   Palpations: Abdomen is soft.  ?   Tenderness: There is no abdominal tenderness.  ?Musculoskeletal:     ?   General: No swelling. Normal range of motion.  ?   Cervical back: Neck supple.  ?Skin: ?   General: Skin is warm and dry.  ?Neurological:  ?   General: No focal deficit present.  ?   Mental Status: He is alert.  ?Psychiatric:     ?   Mood and Affect: Mood normal.  ? ? ?ED Results / Procedures / Treatments   ?EKG ?None ? ?Procedures ?Procedures ? ?Medications Ordered in the ED ?Medications  ?doxycycline (VIBRA-TABS) tablet 100 mg (has no administration in time range)  ?cefTRIAXone (ROCEPHIN) injection 500 mg (has no administration in time range)  ?iohexol (OMNIPAQUE) 350 MG/ML injection 75 mL (75 mLs Intravenous Contrast Given 08/19/21 1234)  ? ? ?Initial Impression and Plan ? Patient offered  empiric STI treatment vs waiting to see results and he wants to proceed with treatment. Advised that if he is having urinary symptoms in the absence of infection, he needs to follow up with Urology for further evaluation. He otherwise has negative workup including CBC, CMP, UA and Covid. I personally viewed the images from radiology studies and agree with radiologist interpretation:  ?CT abd/pel was negative as well.  ? ?ED Course  ? ?  ? ? ?MDM Rules/Calculators/A&P ?Medical Decision Making ?Problems Addressed: ?Diarrhea, unspecified type: acute illness or injury ?Dysuria: chronic illness or injury ? ?Amount and/or Complexity of Data Reviewed ?Labs: ordered. Decision-making details documented in ED Course. ?Radiology: ordered  and independent interpretation performed. ? ?Risk ?Prescription drug management. ? ? ? ?Final Clinical Impression(s) / ED Diagnoses ?Final diagnoses:  ?Diarrhea, unspecified type  ?Dysuria  ? ? ?Rx / DC Orders ?ED Discharge Orders   ? ?      Ordered  ?  doxycycline (VIBRAMYCIN) 100 MG capsule  2 times daily       ? 08/19/21 1537  ? ?  ?  ? ?  ? ?  ?Pollyann Savoy, MD ?08/19/21 1615 ? ?

## 2021-08-22 LAB — GC/CHLAMYDIA PROBE AMP (~~LOC~~) NOT AT ARMC
Chlamydia: NEGATIVE
Comment: NEGATIVE
Comment: NORMAL
Neisseria Gonorrhea: NEGATIVE

## 2021-10-11 ENCOUNTER — Emergency Department (HOSPITAL_COMMUNITY)
Admission: EM | Admit: 2021-10-11 | Discharge: 2021-10-11 | Disposition: A | Discharge status: Home / Self Care | Payer: Medicaid Other | Attending: Emergency Medicine | Admitting: Emergency Medicine

## 2021-10-11 ENCOUNTER — Other Ambulatory Visit: Payer: Self-pay

## 2021-10-11 ENCOUNTER — Emergency Department (HOSPITAL_COMMUNITY): Payer: Medicaid Other

## 2021-10-11 ENCOUNTER — Encounter (HOSPITAL_COMMUNITY): Payer: Self-pay | Admitting: Emergency Medicine

## 2021-10-11 DIAGNOSIS — K529 Noninfective gastroenteritis and colitis, unspecified: Secondary | ICD-10-CM | POA: Diagnosis not present

## 2021-10-11 DIAGNOSIS — R1084 Generalized abdominal pain: Secondary | ICD-10-CM | POA: Diagnosis present

## 2021-10-11 LAB — CBC
HCT: 44.9 % (ref 39.0–52.0)
Hemoglobin: 15.9 g/dL (ref 13.0–17.0)
MCH: 31.5 pg (ref 26.0–34.0)
MCHC: 35.4 g/dL (ref 30.0–36.0)
MCV: 88.9 fL (ref 80.0–100.0)
Platelets: 159 10*3/uL (ref 150–400)
RBC: 5.05 MIL/uL (ref 4.22–5.81)
RDW: 12.8 % (ref 11.5–15.5)
WBC: 4.4 10*3/uL (ref 4.0–10.5)
nRBC: 0 % (ref 0.0–0.2)

## 2021-10-11 LAB — COMPREHENSIVE METABOLIC PANEL
ALT: 14 U/L (ref 0–44)
AST: 17 U/L (ref 15–41)
Albumin: 3.9 g/dL (ref 3.5–5.0)
Alkaline Phosphatase: 50 U/L (ref 38–126)
Anion gap: 8 (ref 5–15)
BUN: 10 mg/dL (ref 6–20)
CO2: 24 mmol/L (ref 22–32)
Calcium: 9.1 mg/dL (ref 8.9–10.3)
Chloride: 108 mmol/L (ref 98–111)
Creatinine, Ser: 1.1 mg/dL (ref 0.61–1.24)
GFR, Estimated: >60 mL/min (ref >60)
Glucose, Bld: 109 mg/dL — ABNORMAL HIGH (ref 70–99)
Potassium: 3.7 mmol/L (ref 3.5–5.1)
Sodium: 140 mmol/L (ref 135–145)
Total Bilirubin: 0.9 mg/dL (ref 0.3–1.2)
Total Protein: 6.7 g/dL (ref 6.5–8.1)

## 2021-10-11 LAB — URINALYSIS, ROUTINE W REFLEX MICROSCOPIC
Bilirubin Urine: NEGATIVE
Glucose, UA: NEGATIVE mg/dL
Hgb urine dipstick: NEGATIVE
Ketones, ur: NEGATIVE mg/dL
Leukocytes,Ua: NEGATIVE
Nitrite: NEGATIVE
Protein, ur: NEGATIVE mg/dL
Specific Gravity, Urine: 1.015 (ref 1.005–1.030)
pH: 5 (ref 5.0–8.0)

## 2021-10-11 LAB — LIPASE, BLOOD: Lipase: 49 U/L (ref 11–51)

## 2021-10-11 MED ORDER — DICYCLOMINE HCL 20 MG PO TABS
20.0000 mg | ORAL_TABLET | Freq: Two times a day (BID) | ORAL | 0 refills | Status: DC
Start: 2021-10-11 — End: 2021-12-26

## 2021-10-11 MED ORDER — LACTATED RINGERS IV BOLUS
1000.0000 mL | Freq: Once | INTRAVENOUS | Status: AC
  Administered 2021-10-11: 1000 mL via INTRAVENOUS

## 2021-10-11 MED ORDER — ONDANSETRON 4 MG PO TBDP: 4.0000 mg | ORAL_TABLET | Freq: Three times a day (TID) | ORAL | 0 refills | Status: DC | PRN

## 2021-10-11 MED ORDER — KETOROLAC TROMETHAMINE 15 MG/ML IJ SOLN
15.0000 mg | Freq: Once | INTRAMUSCULAR | Status: AC
  Administered 2021-10-11: 15 mg via INTRAVENOUS
  Filled 2021-10-11: qty 1

## 2021-10-11 MED ORDER — DICYCLOMINE HCL 10 MG PO CAPS
10.0000 mg | ORAL_CAPSULE | Freq: Once | ORAL | Status: AC
  Administered 2021-10-11: 10 mg via ORAL
  Filled 2021-10-11: qty 1

## 2021-10-11 NOTE — ED Triage Notes (Signed)
Patient arrives ambulatory by POV c/o left sided flank pain, nausea, diarrhea and mid abdominal pain onset of last night. Patient states pain kept him awake.  ?

## 2021-10-11 NOTE — ED Notes (Signed)
Patient paperwork reviewed with the patient by EDP. Pt left before RN could get d/c vitals. ?

## 2021-10-11 NOTE — Discharge Instructions (Addendum)
You were evaluated in the Emergency Department and after careful evaluation, we did not find any emergent condition requiring admission or further testing in the hospital. ? ?Your exam/testing today was overall reassuring.  Your laboratory and imaging work-up was reassuring.  Your abdominal pain improved following Bentyl.  A prescription for this medication has been provided. ? ?Please return to the Emergency Department if you experience any worsening of your condition.  Thank you for allowing [place] to be a part of your care. ? ?

## 2021-10-11 NOTE — ED Provider Notes (Signed)
?[facility] EMERGENCY DEPARTMENT ?Provider Note ? ? ?CSN: [id number] ?Arrival date & time: 10/11/21  3536 ? ?  ? ?History ? ?Chief Complaint  ?Patient presents with  ? Abdominal Pain  ? Flank Pain  ? ? ?Steve Anderson is a 61 y.o. male. ? ? ?Abdominal Pain ?Associated symptoms: diarrhea, nausea and vomiting   ?Flank Pain ?Associated symptoms include abdominal pain.  ? ?61 year old male with a history of chronic presentations for chronic abdominal pain who presents the emergency department with nausea, vomiting, diarrhea and left-sided flank pain.  The patient states that his symptoms have been ongoing for the last 24 hours.  He has had NBNB emesis and around 3 episodes of loose stools.  He endorses nausea and generalized abdominal cramping.  No suspect food intake.  No fevers or chills.  He has had decreased oral intake.  The patient denies any dysuria or increased urinary frequency. ? ?On chart review, the patient has previous presentations for generalized abdominal discomfort with negative work-up, improved symptoms with Bentyl. ? ?Home Medications ?Prior to Admission medications   ?Medication Sig Start Date End Date Taking? Authorizing Provider  ?dicyclomine (BENTYL) 20 MG tablet Take 1 tablet (20 mg total) by mouth 2 (two) times daily. 10/11/21  Yes [name], MD  ?ondansetron (ZOFRAN-ODT) 4 MG disintegrating tablet Take 1 tablet (4 mg total) by mouth every 8 (eight) hours as needed for nausea or vomiting. 10/11/21  Yes [name], MD  ?acetaminophen (TYLENOL) 500 MG tablet Take 1,000 mg by mouth every 6 (six) hours as needed for mild pain or headache.    [provider]  ?loperamide (IMODIUM) 2 MG capsule Take 1 capsule (2 mg total) by mouth 4 (four) times daily as needed for diarrhea or loose stools. 08/08/21   Carroll Sage, PA-C  ?fluticasone (FLONASE) 50 MCG/ACT nasal spray Place 2 sprays into both nostrils daily. ?Patient not taking: Reported on 06/21/2017 12/27/16  07/14/19  Muthersbaugh, Dahlia Client, PA-C  ?   ? ?Allergies    ?Patient has no known allergies.   ? ?Review of Systems   ?Review of Systems  ?Gastrointestinal:  Positive for abdominal pain, diarrhea, nausea and vomiting.  ?Genitourinary:  Positive for flank pain.  ?All other systems reviewed and are negative. ? ?Physical Exam ?Updated Vital Signs ?BP (!) 148/90 (BP Location: Left Arm)   Pulse (!) 48   Temp 98.4 ?F (36.9 ?C) (Oral)   Resp 17   Ht 5\' 9"  (1.753 m)   Wt 87.1 kg   SpO2 96%   BMI 28.35 kg/m?  ?Physical Exam ?Vitals and nursing note reviewed.  ?Constitutional:   ?   General: He is not in acute distress. ?   Appearance: He is well-developed.  ?HENT:  ?   Head: Normocephalic and atraumatic.  ?Eyes:  ?   Conjunctiva/sclera: Conjunctivae normal.  ?Cardiovascular:  ?   Rate and Rhythm: Normal rate and regular rhythm.  ?Pulmonary:  ?   Effort: Pulmonary effort is normal. No respiratory distress.  ?   Breath sounds: Normal breath sounds.  ?Abdominal:  ?   Palpations: Abdomen is soft.  ?   Tenderness: There is generalized abdominal tenderness. There is left CVA tenderness.  ?Musculoskeletal:     ?   General: No swelling.  ?   Cervical back: Neck supple.  ?Skin: ?   General: Skin is warm and dry.  ?   Capillary Refill: Capillary refill takes less than 2 seconds.  ?Neurological:  ?  Mental Status: He is alert.  ?Psychiatric:     ?   Mood and Affect: Mood normal.  ? ? ?ED Results / Procedures / Treatments   ?Labs ?(all labs ordered are listed, but only abnormal results are displayed) ?Labs Reviewed  ?COMPREHENSIVE METABOLIC PANEL - Abnormal; Notable for the following components:  ?    Result Value  ? Glucose, Bld 109 (*)   ? All other components within normal limits  ?LIPASE, BLOOD  ?CBC  ?URINALYSIS, ROUTINE W REFLEX MICROSCOPIC  ? ? ?EKG ?None ? ?Radiology ?CT Renal Stone Study ? ?Result Date: 10/11/2021 ?CLINICAL DATA:  Left flank pain concern for renal stone EXAM: CT ABDOMEN AND PELVIS WITHOUT CONTRAST  TECHNIQUE: Multidetector CT imaging of the abdomen and pelvis was performed following the standard protocol without IV contrast. RADIATION DOSE REDUCTION: This exam was performed according to the departmental dose-optimization program which includes automated exposure control, adjustment of the mA and/or kV according to patient size and/or use of iterative reconstruction technique. COMPARISON:  CT August 19, 2021. FINDINGS: Lower chest: No acute abnormality. No emphysematous change. Scarring/atelectasis in the lung bases. Hepatobiliary: Unremarkable noncontrast appearance of the hepatic parenchyma. Gallbladder is unremarkable. No biliary ductal dilation. Pancreas: Pancreatic ductal dilation or evidence of acute inflammation. Spleen: No splenomegaly or focal splenic lesion. Adrenals/Urinary Tract: Adrenal glands are unremarkable. Kidneys are normal, without renal calculi, contour deforming lesion, or hydronephrosis. Bladder is unremarkable. Stomach/Bowel: Stomach is within normal limits. Appendix appears normal. No evidence of bowel wall thickening, distention, or inflammatory changes. Vascular/Lymphatic: Aortic atherosclerosis. No enlarged abdominal or pelvic lymph nodes. Reproductive: Prostate is unremarkable. Other: No abdominal wall hernia or abnormality. No abdominopelvic ascites. 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 IMPRESSION: 1. No acute intra-abdominal pathology. Specifically, no evidence of nephrolithiasis or obstructive uropathy. 2. Aortic Atherosclerosis (ICD10-I70.0). Electronically Signed   By: Maudry Mayhew M.D.   On: 10/11/2021 11:26   ? ?Procedures ?Procedures  ? ? ?Medications Ordered in ED ?Medications  ?ketorolac (TORADOL) 15 MG/ML injection 15 mg (15 mg Intravenous Given 10/11/21 1135)  ?lactated ringers bolus 1,000 mL (0 mLs Intravenous Stopped 10/11/21 1311)  ?dicyclomine (BENTYL) capsule 10 mg (10 mg Oral Given 10/11/21 1405)  ? ? ?ED Course/  Medical Decision Making/ A&P ?  ?                        ?Medical Decision Making ?Amount and/or Complexity of Data Reviewed ?Labs: ordered. ?Radiology: ordered. ? ?Risk ?Prescription drug management. ? ? ?61 year old male with a history of chronic presentations for chronic abdominal pain who presents the emergency department with nausea, vomiting, diarrhea and left-sided flank pain.  The patient states that his symptoms have been ongoing for the last 24 hours.  He has had NBNB emesis and around 3 episodes of loose stools.  He endorses nausea and generalized abdominal cramping.  No suspect food intake.  No fevers or chills.  He has had decreased oral intake.  The patient denies any dysuria or increased urinary frequency. ? ?On chart review, the patient has previous presentations for generalized abdominal discomfort with negative work-up, improved symptoms with Bentyl. ? ?On arrival, the patient was afebrile, hemodynamically stable, mildly hypertensive BP 142/95, saturating well on room air.  Physical exam significant for generalized abdominal discomfort, left-sided flank tenderness, otherwise well-appearing gentleman in no distress.  Resting comfortably in bed. ? ? ?Differential Diagnosis: Most likely gastroenteritis.  Also considered the following: Appendicitis, Bowel Obstruction,  AAA, ACS, pneumonia, pneumothorax, Pyelonephritis, Nephrolithiasis, Pancreatitis, Cholecystitis, Shingles, Perforated Bowel or Ulcer, Diverticulosis/itis, Ischemic Mesentery, Inflammatory Bowel Disease, Strangulated/Incarcerated Hernia, gastritis, PUD.  ? ?Lab results include: Urinalysis negative for hematuria or UTI, lipase normal, CBC without a leukocytosis or anemia, CMP unremarkable. ? ?Imaging results include: ?   ?IMPRESSION:  ?1. No acute intra-abdominal pathology. Specifically, no evidence of  ?nephrolithiasis or obstructive uropathy.  ?2. Aortic Atherosclerosis (ICD10-I70.0).  ? ? ?Course of tx has consisted of: Patient was  administered IV Toradol, and IV fluid bolus.  His symptoms persisted until he was administered oral Bentyl with subsequent resolution of his abdominal cramping and discomfort. ? ?Thought process: Patient symptoms of nausea, vomitin

## 2021-10-14 ENCOUNTER — Emergency Department (HOSPITAL_COMMUNITY)
Admission: EM | Admit: 2021-10-14 | Discharge: 2021-10-15 | Disposition: A | Discharge status: Home / Self Care | Payer: Medicaid Other | Attending: Emergency Medicine | Admitting: Emergency Medicine

## 2021-10-14 ENCOUNTER — Other Ambulatory Visit: Payer: Self-pay

## 2021-10-14 ENCOUNTER — Encounter (HOSPITAL_COMMUNITY): Payer: Self-pay | Admitting: Emergency Medicine

## 2021-10-14 DIAGNOSIS — R141 Gas pain: Secondary | ICD-10-CM | POA: Insufficient documentation

## 2021-10-14 DIAGNOSIS — K5900 Constipation, unspecified: Secondary | ICD-10-CM | POA: Diagnosis not present

## 2021-10-14 DIAGNOSIS — R0602 Shortness of breath: Secondary | ICD-10-CM | POA: Diagnosis not present

## 2021-10-14 DIAGNOSIS — R1084 Generalized abdominal pain: Secondary | ICD-10-CM | POA: Diagnosis present

## 2021-10-14 LAB — URINALYSIS, ROUTINE W REFLEX MICROSCOPIC
Bilirubin Urine: NEGATIVE
Glucose, UA: NEGATIVE mg/dL
Hgb urine dipstick: NEGATIVE
Ketones, ur: NEGATIVE mg/dL
Leukocytes,Ua: NEGATIVE
Nitrite: NEGATIVE
Protein, ur: NEGATIVE mg/dL
Specific Gravity, Urine: 1.012 (ref 1.005–1.030)
pH: 5 (ref 5.0–8.0)

## 2021-10-14 LAB — CBC WITH DIFFERENTIAL/PLATELET
Abs Immature Granulocytes: 0.01 10*3/uL (ref 0.00–0.07)
Basophils Absolute: 0 10*3/uL (ref 0.0–0.1)
Basophils Relative: 1 %
Eosinophils Absolute: 0.1 10*3/uL (ref 0.0–0.5)
Eosinophils Relative: 2 %
HCT: 43.9 % (ref 39.0–52.0)
Hemoglobin: 15.4 g/dL (ref 13.0–17.0)
Immature Granulocytes: 0 %
Lymphocytes Relative: 49 %
Lymphs Abs: 2.5 10*3/uL (ref 0.7–4.0)
MCH: 31.4 pg (ref 26.0–34.0)
MCHC: 35.1 g/dL (ref 30.0–36.0)
MCV: 89.6 fL (ref 80.0–100.0)
Monocytes Absolute: 0.3 10*3/uL (ref 0.1–1.0)
Monocytes Relative: 6 %
Neutro Abs: 2.2 10*3/uL (ref 1.7–7.7)
Neutrophils Relative %: 42 %
Platelets: 159 10*3/uL (ref 150–400)
RBC: 4.9 MIL/uL (ref 4.22–5.81)
RDW: 12.7 % (ref 11.5–15.5)
WBC: 5.1 10*3/uL (ref 4.0–10.5)
nRBC: 0 % (ref 0.0–0.2)

## 2021-10-14 LAB — LIPASE, BLOOD: Lipase: 45 U/L (ref 11–51)

## 2021-10-14 LAB — COMPREHENSIVE METABOLIC PANEL
ALT: 14 U/L (ref 0–44)
AST: 15 U/L (ref 15–41)
Albumin: 3.8 g/dL (ref 3.5–5.0)
Alkaline Phosphatase: 49 U/L (ref 38–126)
Anion gap: 4 — ABNORMAL LOW (ref 5–15)
BUN: 11 mg/dL (ref 6–20)
CO2: 27 mmol/L (ref 22–32)
Calcium: 9.2 mg/dL (ref 8.9–10.3)
Chloride: 107 mmol/L (ref 98–111)
Creatinine, Ser: 0.97 mg/dL (ref 0.61–1.24)
GFR, Estimated: >60 mL/min (ref >60)
Glucose, Bld: 135 mg/dL — ABNORMAL HIGH (ref 70–99)
Potassium: 3.7 mmol/L (ref 3.5–5.1)
Sodium: 138 mmol/L (ref 135–145)
Total Bilirubin: 0.9 mg/dL (ref 0.3–1.2)
Total Protein: 6.9 g/dL (ref 6.5–8.1)

## 2021-10-14 NOTE — ED Provider Triage Note (Signed)
Emergency Medicine Provider Triage Evaluation Note ? ?Steve Anderson , a 61 y.o. male  was evaluated in triage.  Pt complains of domino discomfort.  Patient states for 2 to 3 days he has had some pain in the mid of his abdomen.  He says this comes and goes.  He feels like it moves around.  He has had a couple episodes of diarrhea that is nonbloody.  He says about 3 episodes of diarrhea a day.  He has not had any nausea or vomiting.  He has never had the symptoms before.  He says is worse after he eats and he feels like his food is going right through him.  He has been tolerating p.o. just fine.  Denies any fevers.. ? ?Review of Systems  ?Positive:  ?Negative:  ? ?Physical Exam  ?BP (!) 144/85   Pulse 72   Temp 98.3 ?F (36.8 ?C) (Oral)   Resp 18   SpO2 100%  ?Gen:   Awake, no distress   ?Resp:  Normal effort  ?MSK:   Moves extremities without difficulty  ?Other:  Abdomen is soft.  Mild tenderness in the umbilical region. ? ?Medical Decision Making  ?Medically screening exam initiated at 9:16 PM.  Appropriate orders placed.  Steve Anderson was informed that the remainder of the evaluation will be completed by another provider, this initial triage assessment does not replace that evaluation, and the importance of remaining in the ED until their evaluation is complete. ? ? ?  ?Claudie Leach, PA-C ?10/14/21 2117 ? ?

## 2021-10-14 NOTE — ED Triage Notes (Signed)
Pt reported to ED with c/o pain to center of abdomen around umbilicus x 3 days and feels like "something is moving around in stomach". Also endorses excessive gassiness, reports BM are normal. Denies any nausea, vomiting, chest pain or ShOB. States his urine is "stronger" in smell than normal.  ?

## 2021-10-15 ENCOUNTER — Encounter (HOSPITAL_COMMUNITY): Payer: Self-pay | Admitting: Emergency Medicine

## 2021-10-15 NOTE — ED Provider Notes (Signed)
?[facility] ?Provider Note ? ? ?CSN: [id number] ?Arrival date & time: 10/14/21  2020 ? ?  ? ?History ? ?Chief Complaint  ?Patient presents with  ? Abdominal Pain  ? ? ?Steve Anderson is a 61 y.o. male. ? ?The history is provided by the patient.  ?Abdominal Pain ?Pain location:  Generalized ?Pain quality: not aching   ?Pain quality comment:  Feeling moving and is passing a lot of gas.  Has been eating a lot of greasy food. ?Pain radiates to:  Does not radiate ?Pain severity:  Moderate ?Onset quality:  Gradual ?Duration:  1 week ?Timing:  Constant ?Progression:  Unchanged ?Chronicity:  New ?Context: not alcohol use   ?Relieved by:  Nothing ?Worsened by:  Nothing ?Ineffective treatments:  None tried ?Associated symptoms: shortness of breath   ?Associated symptoms: no anorexia, no diarrhea, no fever, no nausea and no vomiting   ?Risk factors: no alcohol abuse   ? ?  ? ?Home Medications ?Prior to Admission medications   ?Medication Sig Start Date End Date Taking? Authorizing Provider  ?acetaminophen (TYLENOL) 500 MG tablet Take 1,000 mg by mouth every 6 (six) hours as needed for mild pain or headache.    [provider]  ?dicyclomine (BENTYL) 20 MG tablet Take 1 tablet (20 mg total) by mouth 2 (two) times daily. 10/11/21   Ernie AvenaLawsing, James, MD  ?loperamide (IMODIUM) 2 MG capsule Take 1 capsule (2 mg total) by mouth 4 (four) times daily as needed for diarrhea or loose stools. 08/08/21   Carroll SageFaulkner, William J, PA-C  ?ondansetron (ZOFRAN-ODT) 4 MG disintegrating tablet Take 1 tablet (4 mg total) by mouth every 8 (eight) hours as needed for nausea or vomiting. 10/11/21   Ernie AvenaLawsing, James, MD  ?fluticasone (FLONASE) 50 MCG/ACT nasal spray Place 2 sprays into both nostrils daily. ?Patient not taking: Reported on 06/21/2017 12/27/16 07/14/19  Muthersbaugh, Dahlia ClientHannah, PA-C  ?   ? ?Allergies    ?Patient has no known allergies.   ? ?Review of Systems   ?Review of Systems  ?Constitutional:  Negative  for fever.  ?HENT:  Negative for congestion.   ?Eyes:  Negative for photophobia.  ?Respiratory:  Positive for shortness of breath.   ?Gastrointestinal:  Positive for abdominal pain. Negative for anorexia, diarrhea, nausea and vomiting.  ?Musculoskeletal:  Negative for arthralgias.  ?Psychiatric/Behavioral:  Negative for agitation.   ?All other systems reviewed and are negative. ? ?Physical Exam ?Updated Vital Signs ?BP 137/79 (BP Location: Left Arm)   Pulse (!) 58   Temp 97.7 ?F (36.5 ?C) (Oral)   Resp 19   SpO2 98%  ?Physical Exam ?Vitals and nursing note reviewed.  ?Constitutional:   ?   General: He is not in acute distress. ?   Appearance: Normal appearance.  ?HENT:  ?   Head: Normocephalic and atraumatic.  ?   Nose: Nose normal.  ?Eyes:  ?   Conjunctiva/sclera: Conjunctivae normal.  ?   Pupils: Pupils are equal, round, and reactive to light.  ?Cardiovascular:  ?   Rate and Rhythm: Normal rate and regular rhythm.  ?   Pulses: Normal pulses.  ?   Heart sounds: Normal heart sounds.  ?Pulmonary:  ?   Effort: Pulmonary effort is normal.  ?   Breath sounds: Normal breath sounds.  ?Abdominal:  ?   Palpations: Abdomen is soft. There is no mass.  ?   Tenderness: There is no abdominal tenderness. There is no guarding or rebound.  ?  Hernia: No hernia is present.  ?   Comments: Gassy throughout   ?Musculoskeletal:     ?   General: Normal range of motion.  ?   Cervical back: Normal range of motion and neck supple.  ?Skin: ?   General: Skin is warm and dry.  ?   Capillary Refill: Capillary refill takes less than 2 seconds.  ?Neurological:  ?   General: No focal deficit present.  ?   Mental Status: He is alert and oriented to person, place, and time.  ?   Deep Tendon Reflexes: Reflexes normal.  ?Psychiatric:     ?   Behavior: Behavior normal.  ? ? ?ED Results / Procedures / Treatments   ?Labs ?(all labs ordered are listed, but only abnormal results are displayed) ?Results for orders placed or performed during the  hospital encounter of 10/14/21  ?CBC with Differential  ?Result Value Ref Range  ? WBC 5.1 4.0 - 10.5 K/uL  ? RBC 4.90 4.22 - 5.81 MIL/uL  ? Hemoglobin 15.4 13.0 - 17.0 g/dL  ? HCT 43.9 39.0 - 52.0 %  ? MCV 89.6 80.0 - 100.0 fL  ? MCH 31.4 26.0 - 34.0 pg  ? MCHC 35.1 30.0 - 36.0 g/dL  ? RDW 12.7 11.5 - 15.5 %  ? Platelets 159 150 - 400 K/uL  ? nRBC 0.0 0.0 - 0.2 %  ? Neutrophils Relative % 42 %  ? Neutro Abs 2.2 1.7 - 7.7 K/uL  ? Lymphocytes Relative 49 %  ? Lymphs Abs 2.5 0.7 - 4.0 K/uL  ? Monocytes Relative 6 %  ? Monocytes Absolute 0.3 0.1 - 1.0 K/uL  ? Eosinophils Relative 2 %  ? Eosinophils Absolute 0.1 0.0 - 0.5 K/uL  ? Basophils Relative 1 %  ? Basophils Absolute 0.0 0.0 - 0.1 K/uL  ? Immature Granulocytes 0 %  ? Abs Immature Granulocytes 0.01 0.00 - 0.07 K/uL  ?Comprehensive metabolic panel  ?Result Value Ref Range  ? Sodium 138 135 - 145 mmol/L  ? Potassium 3.7 3.5 - 5.1 mmol/L  ? Chloride 107 98 - 111 mmol/L  ? CO2 27 22 - 32 mmol/L  ? Glucose, Bld 135 (H) 70 - 99 mg/dL  ? BUN 11 6 - 20 mg/dL  ? Creatinine, Ser 0.97 0.61 - 1.24 mg/dL  ? Calcium 9.2 8.9 - 10.3 mg/dL  ? Total Protein 6.9 6.5 - 8.1 g/dL  ? Albumin 3.8 3.5 - 5.0 g/dL  ? AST 15 15 - 41 U/L  ? ALT 14 0 - 44 U/L  ? Alkaline Phosphatase 49 38 - 126 U/L  ? Total Bilirubin 0.9 0.3 - 1.2 mg/dL  ? GFR, Estimated >60 >60 mL/min  ? Anion gap 4 (L) 5 - 15  ?Lipase, blood  ?Result Value Ref Range  ? Lipase 45 11 - 51 U/L  ?Urinalysis, Routine w reflex microscopic Urine, Clean Catch  ?Result Value Ref Range  ? Color, Urine YELLOW YELLOW  ? APPearance CLEAR CLEAR  ? Specific Gravity, Urine 1.012 1.005 - 1.030  ? pH 5.0 5.0 - 8.0  ? Glucose, UA NEGATIVE NEGATIVE mg/dL  ? Hgb urine dipstick NEGATIVE NEGATIVE  ? Bilirubin Urine NEGATIVE NEGATIVE  ? Ketones, ur NEGATIVE NEGATIVE mg/dL  ? Protein, ur NEGATIVE NEGATIVE mg/dL  ? Nitrite NEGATIVE NEGATIVE  ? Leukocytes,Ua NEGATIVE NEGATIVE  ? ?CT Renal Stone Study ? ?Result Date: 10/11/2021 ?CLINICAL DATA:  Left  flank pain concern for renal stone EXAM: CT ABDOMEN AND PELVIS WITHOUT  CONTRAST TECHNIQUE: Multidetector CT imaging of the abdomen and pelvis was performed following the standard protocol without IV contrast. RADIATION DOSE REDUCTION: This exam was performed according to the departmental dose-optimization program which includes automated exposure control, adjustment of the mA and/or kV according to patient size and/or use of iterative reconstruction technique. COMPARISON:  CT August 19, 2021. FINDINGS: Lower chest: No acute abnormality. No emphysematous change. Scarring/atelectasis in the lung bases. Hepatobiliary: Unremarkable noncontrast appearance of the hepatic parenchyma. Gallbladder is unremarkable. No biliary ductal dilation. Pancreas: Pancreatic ductal dilation or evidence of acute inflammation. Spleen: No splenomegaly or focal splenic lesion. Adrenals/Urinary Tract: Adrenal glands are unremarkable. Kidneys are normal, without renal calculi, contour deforming lesion, or hydronephrosis. Bladder is unremarkable. Stomach/Bowel: Stomach is within normal limits. Appendix appears normal. No evidence of bowel wall thickening, distention, or inflammatory changes. Vascular/Lymphatic: Aortic atherosclerosis. No enlarged abdominal or pelvic lymph nodes. Reproductive: Prostate is unremarkable. Other: No abdominal wall hernia or abnormality. No abdominopelvic ascites. 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 IMPRESSION: 1. No acute intra-abdominal pathology. Specifically, no evidence of nephrolithiasis or obstructive uropathy. 2. Aortic Atherosclerosis (ICD10-I70.0). Electronically Signed   By: Maudry Mayhew M.D.   On: 10/11/2021 11:26   ?  ? ?Radiology ?No results found. ? ?Procedures ?Procedures  ? ? ?Medications Ordered in ED ?Medications - No data to display ? ?ED Course/ Medical Decision Making/ A&P ?  ?                        ?Medical Decision Making ?Gassy and pain with  gas and feels internal movment  ? ?Amount and/or Complexity of Data Reviewed ?External Data Reviewed: notes. ?   Details: previous notes reviewed ?Labs: ordered. ?   Details: all labs reviewed and are normal:  uri

## 2021-10-15 NOTE — ED Notes (Signed)
Patient verbalizes understanding of d/c instructions. Opportunities for questions and answers were provided. Pt d/c from ED and refused discharge VS. ?

## 2021-10-15 NOTE — Discharge Instructions (Signed)
Miralax, one capful daily

## 2021-12-26 ENCOUNTER — Encounter (HOSPITAL_COMMUNITY): Payer: Self-pay

## 2021-12-26 ENCOUNTER — Ambulatory Visit (HOSPITAL_COMMUNITY)
Admission: EM | Admit: 2021-12-26 | Discharge: 2021-12-26 | Disposition: A | Discharge status: Home / Self Care | Payer: Medicaid Other | Attending: Urgent Care | Admitting: Urgent Care

## 2021-12-26 DIAGNOSIS — R300 Dysuria: Secondary | ICD-10-CM | POA: Diagnosis not present

## 2021-12-26 DIAGNOSIS — R1032 Left lower quadrant pain: Secondary | ICD-10-CM | POA: Insufficient documentation

## 2021-12-26 LAB — POCT URINALYSIS DIPSTICK, ED / UC
Bilirubin Urine: NEGATIVE
Glucose, UA: NEGATIVE mg/dL
Ketones, ur: NEGATIVE mg/dL
Leukocytes,Ua: NEGATIVE
Nitrite: NEGATIVE
Protein, ur: NEGATIVE mg/dL
Specific Gravity, Urine: 1.02 (ref 1.005–1.030)
Urobilinogen, UA: 0.2 mg/dL (ref 0.0–1.0)
pH: 5.5 (ref 5.0–8.0)

## 2021-12-26 MED ORDER — AMOXICILLIN-POT CLAVULANATE 875-125 MG PO TABS: 1.0000 | ORAL_TABLET | Freq: Two times a day (BID) | ORAL | 0 refills | Status: AC

## 2021-12-26 NOTE — ED Provider Notes (Signed)
[facility]    CSN: [id number] Arrival date & time: 12/26/21  0803      History   Chief Complaint Chief Complaint  Patient presents with   Abdominal Pain   Urinary Frequency    HPI Steve Anderson is a 61 y.o. male.   Pleasant 61 year old male presents today with a 1 week history of an 8 out of 10 left lower quadrant and suprapubic abdominal pain.  He reports it as a burning in nature.  He states it burns when he pees, but also burns at rest.  After thorough chart review, it appears that patient has had about 5 episodes a year of similar abdominal pain dating all the way back to 2013.  He has had countless CT scans of his abdomen and pelvis, all of which were unremarkable.  He states that the pain he is experiencing today feels very similar to all of his prior episodes.  He has never seen a gastroenterologist.  He states his stool is loose, but denies melena or hematochezia.  He denies nausea or vomiting.  His appetite is unaffected.  He denies any rash or change to the skin.  He denies a fever. He is concerned about STI. Has been treated for this twice already this year, with all normal cytologies.    Abdominal Pain Associated symptoms: diarrhea   Urinary Frequency Associated symptoms include abdominal pain.    Past Medical History:  Diagnosis Date   Chronic pain     Patient Active Problem List   Diagnosis Date Noted   Chronic bilateral low back pain with left-sided sciatica 01/25/2021   Chronic pain of left ankle 01/25/2021   Dysuria 07/07/2013   Possible exposure to STD 07/07/2013   LLQ pain 07/07/2013    Past Surgical History:  Procedure Laterality Date   ANKLE FRACTURE SURGERY Left    ORTHOPEDIC SURGERY         Home Medications    Prior to Admission medications   Medication Sig Start Date End Date Taking? Authorizing Provider  amoxicillin-clavulanate (AUGMENTIN) 875-125 MG tablet Take 1 tablet by mouth 2 (two) times daily with a meal for 7 days.  12/26/21 01/02/22 Yes Ichelle Harral L, PA  acetaminophen (TYLENOL) 500 MG tablet Take 1,000 mg by mouth every 6 (six) hours as needed for mild pain or headache.    [provider]  fluticasone (FLONASE) 50 MCG/ACT nasal spray Place 2 sprays into both nostrils daily. Patient not taking: Reported on 06/21/2017 12/27/16 07/14/19  Muthersbaugh, Dahlia Client, PA-C    Family History Family History  Problem Relation Age of Onset   Hypertension Mother    Brain cancer Mother        not clear if actually malignant tumor   Hypertension Father    Cancer Father        Throat    Social History Social History   Tobacco Use   Smoking status: Every Day    Packs/day: 0.50    Types: Cigarettes   Smokeless tobacco: Never  Vaping Use   Vaping Use: Never used  Substance Use Topics   Alcohol use: Not Currently    Alcohol/week: 28.0 standard drinks of alcohol    Types: 28 Cans of beer per week    Comment: daily   Drug use: Not Currently     Allergies   Patient has no known allergies.   Review of Systems Review of Systems  Gastrointestinal:  Positive for abdominal pain and diarrhea.  Genitourinary:  Positive for frequency.  All other systems reviewed and are negative.    Physical Exam Triage Vital Signs ED Triage Vitals [12/26/21 0818]  Enc Vitals Group     BP (!) 154/84     Pulse Rate 67     Resp 18     Temp 97.6 F (36.4 C)     Temp Source Oral     SpO2 98 %     Weight      Height      Head Circumference      Peak Flow      Pain Score 10     Pain Loc      Pain Edu?      Excl. in GC?    No data found.  Updated Vital Signs BP (!) 154/84 (BP Location: Left Arm)   Pulse 67   Temp 97.6 F (36.4 C) (Oral)   Resp 18   SpO2 98%   Visual Acuity Right Eye Distance:   Left Eye Distance:   Bilateral Distance:    Right Eye Near:   Left Eye Near:    Bilateral Near:     Physical Exam Vitals and nursing note reviewed.  Constitutional:      General: He is not in acute  distress.    Appearance: He is well-developed and normal weight. He is not toxic-appearing or diaphoretic.     Comments: Stated pain out of proportion to exam  HENT:     Head: Normocephalic and atraumatic.     Mouth/Throat:     Mouth: Mucous membranes are moist.     Pharynx: Oropharynx is clear. No pharyngeal swelling or oropharyngeal exudate.  Eyes:     General: No scleral icterus.    Extraocular Movements: Extraocular movements intact.     Pupils: Pupils are equal, round, and reactive to light.  Cardiovascular:     Rate and Rhythm: Normal rate and regular rhythm.     Heart sounds: Normal heart sounds. No murmur heard. Pulmonary:     Effort: Pulmonary effort is normal. No respiratory distress.     Breath sounds: Normal breath sounds. No stridor. No wheezing, rhonchi or rales.  Chest:     Chest wall: No tenderness.  Abdominal:     General: Abdomen is flat. Bowel sounds are normal. There is no distension. There are no signs of injury.     Palpations: Abdomen is soft. There is no hepatomegaly, splenomegaly or mass.     Tenderness: There is abdominal tenderness in the left lower quadrant. There is no right CVA tenderness, left CVA tenderness, guarding or rebound. Negative signs include Murphy's sign, Rovsing's sign and McBurney's sign.     Hernia: No hernia is present.  Skin:    General: Skin is warm and dry.     Capillary Refill: Capillary refill takes less than 2 seconds.     Coloration: Skin is not cyanotic, jaundiced or pale.     Findings: No rash.  Neurological:     General: No focal deficit present.     Mental Status: He is alert and oriented to person, place, and time.  Psychiatric:        Mood and Affect: Mood normal.        Behavior: Behavior normal.      UC Treatments / Results  Labs (all labs ordered are listed, but only abnormal results are displayed) Labs Reviewed  POCT URINALYSIS DIPSTICK, ED / UC - Abnormal; Notable for the following components:  Result  Value   Hgb urine dipstick TRACE (*)    All other components within normal limits  CYTOLOGY, (ORAL, ANAL, URETHRAL) ANCILLARY ONLY    EKG   Radiology No results found.  Procedures Procedures (including critical care time)  Medications Ordered in UC Medications - No data to display  Initial Impression / Assessment and Plan / UC Course  I have reviewed the triage vital signs and the nursing notes.  Pertinent labs & imaging results that were available during my care of the patient were reviewed by me and considered in my medical decision making (see chart for details).     LLQ pain - pt with 10+ year hx of recurrent abdominal pain which has prompted numerous ER and UC visits. CT scans and labs have been unremarkable in the past. Pt has never had a formal PCP or GI workup. Pt does have risk factors for mesenteric ischemia, however sx sound inconsistent with this. Will start augmentin to cover for possibility of mild developing diverticulitis, however additional considerations such as porphyria, Crohns, UC must be considered. Pt to call GI to schedule a follow up. RTC precautions reviewed. Dysuria - UA unremarkable. Pt has had numerous GC/chlam tests recently, all negative despite tx. Will avoid additional tx today and only contact pt to initiate therapy if swab positive. Avoid intercourse until test results obtained  Final Clinical Impressions(s) / UC Diagnoses   Final diagnoses:  LLQ pain  Dysuria     Discharge Instructions      Your abdominal pain has been recurrent since 2013, workup to date has been negative. IT IS IMPERATIVE that you follow up with Gastroenterology, and have an appropriate workup, including colonoscopy, performed. Will do a trial of Augmentin today to cover for possible mild developing diverticulitis.  Your urine sample is negative for infection. You were swabbed for Gonorrhea, Chlamydia and trichomonas, we will call you if it requires additional  treatment.  If you develop worsening pain, fever or vomiting, RTC or head to ER      ED Prescriptions     Medication Sig Dispense Auth. Provider   amoxicillin-clavulanate (AUGMENTIN) 875-125 MG tablet Take 1 tablet by mouth 2 (two) times daily with a meal for 7 days. 14 tablet Camilo Mander L, [place]      PDMP not reviewed this encounter.   Maretta Bees, [place] 12/26/21 [phone]

## 2021-12-26 NOTE — Discharge Instructions (Addendum)
Your abdominal pain has been recurrent since 2013, workup to date has been negative. IT IS IMPERATIVE that you follow up with Gastroenterology, and have an appropriate workup, including colonoscopy, performed. Will do a trial of Augmentin today to cover for possible mild developing diverticulitis.  Your urine sample is negative for infection. You were swabbed for Gonorrhea, Chlamydia and trichomonas, we will call you if it requires additional treatment.  If you develop worsening pain, fever or vomiting, RTC or head to ER

## 2021-12-26 NOTE — ED Triage Notes (Signed)
Pt c/o LLQ pain radiating to center lower abdomen x1wk. States having discomfort to penis with burning on urination. States thinks its an STD. Denies taking any meds.

## 2021-12-27 ENCOUNTER — Telehealth (HOSPITAL_COMMUNITY): Payer: Self-pay | Admitting: Emergency Medicine

## 2021-12-27 LAB — CYTOLOGY, (ORAL, ANAL, URETHRAL) ANCILLARY ONLY
Chlamydia: POSITIVE — AB
Comment: NEGATIVE
Comment: NEGATIVE
Comment: NORMAL
Neisseria Gonorrhea: NEGATIVE
Trichomonas: NEGATIVE

## 2021-12-29 MED ORDER — DOXYCYCLINE HYCLATE 100 MG PO CAPS: 100.0000 mg | ORAL_CAPSULE | Freq: Two times a day (BID) | ORAL | 0 refills | Status: AC

## 2022-01-19 ENCOUNTER — Emergency Department (HOSPITAL_COMMUNITY): Payer: Medicaid Other

## 2022-01-19 ENCOUNTER — Emergency Department (HOSPITAL_COMMUNITY)
Admission: EM | Admit: 2022-01-19 | Discharge: 2022-01-20 | Discharge status: Against Medical Advice | Payer: Medicaid Other | Attending: Emergency Medicine | Admitting: Emergency Medicine

## 2022-01-19 ENCOUNTER — Other Ambulatory Visit: Payer: Self-pay

## 2022-01-19 DIAGNOSIS — R300 Dysuria: Secondary | ICD-10-CM | POA: Diagnosis present

## 2022-01-19 DIAGNOSIS — R1032 Left lower quadrant pain: Secondary | ICD-10-CM | POA: Diagnosis not present

## 2022-01-19 DIAGNOSIS — R197 Diarrhea, unspecified: Secondary | ICD-10-CM | POA: Insufficient documentation

## 2022-01-19 LAB — COMPREHENSIVE METABOLIC PANEL
ALT: 13 U/L (ref 0–44)
AST: 17 U/L (ref 15–41)
Albumin: 3.8 g/dL (ref 3.5–5.0)
Alkaline Phosphatase: 55 U/L (ref 38–126)
Anion gap: 9 (ref 5–15)
BUN: 6 mg/dL (ref 6–20)
CO2: 22 mmol/L (ref 22–32)
Calcium: 9.1 mg/dL (ref 8.9–10.3)
Chloride: 105 mmol/L (ref 98–111)
Creatinine, Ser: 0.95 mg/dL (ref 0.61–1.24)
GFR, Estimated: >60 mL/min (ref >60)
Glucose, Bld: 96 mg/dL (ref 70–99)
Potassium: 3.9 mmol/L (ref 3.5–5.1)
Sodium: 136 mmol/L (ref 135–145)
Total Bilirubin: 0.8 mg/dL (ref 0.3–1.2)
Total Protein: 6.8 g/dL (ref 6.5–8.1)

## 2022-01-19 LAB — URINALYSIS, ROUTINE W REFLEX MICROSCOPIC
Bilirubin Urine: NEGATIVE
Glucose, UA: NEGATIVE mg/dL
Ketones, ur: NEGATIVE mg/dL
Nitrite: NEGATIVE
Protein, ur: NEGATIVE mg/dL
Specific Gravity, Urine: 1.013 (ref 1.005–1.030)
pH: 5 (ref 5.0–8.0)

## 2022-01-19 LAB — LIPASE, BLOOD: Lipase: 40 U/L (ref 11–51)

## 2022-01-19 LAB — CBC
HCT: 44.6 % (ref 39.0–52.0)
Hemoglobin: 15.5 g/dL (ref 13.0–17.0)
MCH: 30.9 pg (ref 26.0–34.0)
MCHC: 34.8 g/dL (ref 30.0–36.0)
MCV: 88.8 fL (ref 80.0–100.0)
Platelets: 172 10*3/uL (ref 150–400)
RBC: 5.02 MIL/uL (ref 4.22–5.81)
RDW: 13.8 % (ref 11.5–15.5)
WBC: 4.2 10*3/uL (ref 4.0–10.5)
nRBC: 0 % (ref 0.0–0.2)

## 2022-01-19 MED ORDER — KETOROLAC TROMETHAMINE 30 MG/ML IJ SOLN
15.0000 mg | Freq: Once | INTRAMUSCULAR | Status: AC
  Administered 2022-01-19: 15 mg via INTRAMUSCULAR
  Filled 2022-01-19: qty 1

## 2022-01-19 MED ORDER — DOXYCYCLINE HYCLATE 100 MG PO TABS
100.0000 mg | ORAL_TABLET | Freq: Once | ORAL | Status: AC
  Administered 2022-01-19: 100 mg via ORAL
  Filled 2022-01-19: qty 1

## 2022-01-19 MED ORDER — SODIUM CHLORIDE 0.9 % IV SOLN
1.0000 g | Freq: Once | INTRAVENOUS | Status: AC
  Administered 2022-01-19: 1 g via INTRAVENOUS
  Filled 2022-01-19: qty 10

## 2022-01-19 NOTE — ED Provider Triage Note (Signed)
Emergency Medicine Provider Triage Evaluation Note  Steve Anderson , a 61 y.o. male  was evaluated in triage.  Pt complains of left-sided flank pain.  Patient states the symptoms began approximately 3 to 4 days ago.  He states symptoms have been constant since onset but intermittent in intensity.  He notes some dysuria company.  Denies history of kidney stones in the past.  He states that he has low left back pain that radiates into his left groin area.  No noted hematuria or fever, vomiting, nausea.  Review of Systems  Positive: See above Negative:   Physical Exam  BP 126/85   Pulse 64   Temp 97.6 F (36.4 C) (Oral)   Resp 16   SpO2 96%  Gen:   Awake, no distress   Resp:  Normal effort  MSK:   Moves extremities without difficulty  Other:  Left-sided CVA tenderness.  Medical Decision Making  Medically screening exam initiated at 3:34 PM.  Appropriate orders placed.  [name] was informed that the remainder of the evaluation will be completed by another provider, this initial triage assessment does not replace that evaluation, and the importance of remaining in the ED until their evaluation is complete.     Peter Garter, [place] 01/19/22 1535

## 2022-01-19 NOTE — ED Provider Notes (Signed)
[facility] Provider Note   CSN: [id number] Arrival date & time: 01/19/22  1351     History  Chief Complaint  Patient presents with   Abdominal Pain    Steve Anderson is a 61 y.o. male.  HPI     This is a 61 year old male who presents with left-sided flank and left lower quadrant pain.  Patient reports onset of symptoms in the last 2 to 3 days.  He reports constant dull left-sided abdominal discomfort.  He also reports left-sided back pain.  He has had diarrhea and dysuria.  No vomiting.  No fevers.  No known history of kidney stones.  He does report recent new sexual partners and unprotected sex.  He is concerned about STDs.  Home Medications Prior to Admission medications   Medication Sig Start Date End Date Taking? Authorizing Provider  acetaminophen (TYLENOL) 500 MG tablet Take 1,000 mg by mouth every 6 (six) hours as needed for mild pain or headache.    [provider]  fluticasone (FLONASE) 50 MCG/ACT nasal spray Place 2 sprays into both nostrils daily. Patient not taking: Reported on 06/21/2017 12/27/16 07/14/19  Muthersbaugh, Jarrett Soho, PA-C      Allergies    Patient has no known allergies.    Review of Systems   Review of Systems  Constitutional:  Negative for fever.  Gastrointestinal:  Positive for abdominal pain.  Genitourinary:  Positive for dysuria and flank pain.  All other systems reviewed and are negative.   Physical Exam Updated Vital Signs BP (!) 143/100 (BP Location: Left Arm)   Pulse 73   Temp 98.2 F (36.8 C) (Oral)   Resp 16   SpO2 99%  Physical Exam Vitals and nursing note reviewed.  Constitutional:      Appearance: He is well-developed. He is not ill-appearing.  HENT:     Head: Normocephalic and atraumatic.  Eyes:     Pupils: Pupils are equal, round, and reactive to light.  Cardiovascular:     Rate and Rhythm: Normal rate and regular rhythm.     Heart sounds: Normal heart sounds. No murmur  heard. Pulmonary:     Effort: Pulmonary effort is normal. No respiratory distress.     Breath sounds: Normal breath sounds. No wheezing.  Abdominal:     General: Bowel sounds are normal.     Palpations: Abdomen is soft.     Tenderness: There is abdominal tenderness in the left lower quadrant. There is left CVA tenderness. There is no rebound.  Musculoskeletal:     Cervical back: Neck supple.  Lymphadenopathy:     Cervical: No cervical adenopathy.  Skin:    General: Skin is warm and dry.  Neurological:     Mental Status: He is alert and oriented to person, place, and time.  Psychiatric:        Mood and Affect: Mood normal.     ED Results / Procedures / Treatments   Labs (all labs ordered are listed, but only abnormal results are displayed) Labs Reviewed  URINALYSIS, ROUTINE W REFLEX MICROSCOPIC - Abnormal; Notable for the following components:      Result Value   Hgb urine dipstick SMALL (*)    Leukocytes,Ua MODERATE (*)    Bacteria, UA RARE (*)    All other components within normal limits  URINE CULTURE  LIPASE, BLOOD  COMPREHENSIVE METABOLIC PANEL  CBC  GC/CHLAMYDIA PROBE AMP ([place]) NOT AT [facility]    EKG None  Radiology  CT Renal Stone Study  Result Date: 01/19/2022 CLINICAL DATA:  Concern for left nephrolithiasis EXAM: CT ABDOMEN AND PELVIS WITHOUT CONTRAST TECHNIQUE: Multidetector CT imaging of the abdomen and pelvis was performed following the standard protocol without IV contrast. RADIATION DOSE REDUCTION: This exam was performed according to the departmental dose-optimization program which includes automated exposure control, adjustment of the mA and/or kV according to patient size and/or use of iterative reconstruction technique. COMPARISON:  CT 10/11/2021 FINDINGS: Lower chest: Scarring/emphysema in the lung bases. Hepatobiliary: No suspicious focal liver abnormality is seen. No gallstones, gallbladder wall thickening, or biliary dilatation. Pancreas:  Unremarkable. No pancreatic ductal dilatation or surrounding inflammatory changes. Spleen: Normal in size without focal abnormality. Adrenals/Urinary Tract: Adrenal glands are unremarkable. Kidneys are normal, without renal calculi, suspicious focal lesion, or hydronephrosis. Bladder is unremarkable. Stomach/Bowel: Stomach is within normal limits. The appendix is normal. No evidence of bowel wall thickening, distention, or inflammatory changes. Vascular/Lymphatic: Aortic atherosclerosis. No enlarged abdominal or pelvic lymph nodes. Reproductive: Unremarkable. Other: No free intraperitoneal fluid or air. Musculoskeletal: No acute or significant osseous findings. 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 IMPRESSION: 1. No acute abdominopelvic abnormality. Specifically no nephrolithiasis or hydronephrosis. 2.  Aortic Atherosclerosis (ICD10-I70.0). Electronically Signed   By: Minerva Fester M.D.   On: 01/19/2022 16:09    Procedures Procedures    Medications Ordered in ED Medications  ketorolac (TORADOL) 30 MG/ML injection 15 mg (15 mg Intramuscular Given 01/19/22 2345)  cefTRIAXone (ROCEPHIN) 1 g in sodium chloride 0.9 % 100 mL IVPB (1 g Intravenous New Bag/Given 01/19/22 2344)  doxycycline (VIBRA-TABS) tablet 100 mg (100 mg Oral Given 01/19/22 2344)    ED Course/ Medical Decision Making/ A&P Clinical Course as of 01/20/22 0031  Fri Jan 20, 2022  0030 Per nursing, patient eloped after receiving 1 dose of doxycycline. [CH]    Clinical Course User Index [CH] Ryker Pherigo, Mayer Masker, MD                           Medical Decision Making Amount and/or Complexity of Data Reviewed Labs: ordered.  Risk Prescription drug management.   This patient presents to the ED for concern of abdominal pain, dysuria, this involves an extensive number of treatment options, and is a complaint that carries with it a high risk of complications and morbidity.  I considered the following  differential and admission for this acute, potentially life threatening condition.  The differential diagnosis includes pyelonephritis, kidney stone, diverticulitis, STD  MDM: This is a 61 year old male who presents with left lower quadrant and left flank pain.  He is nontoxic and vital signs largely reassuring.  He is concerned regarding STDs.  However, clinically would have higher suspicion for pyelonephritis or kidney stone.  Labs obtained.  No leukocytosis.  No metabolic derangements.  CT scan does not show any obstructing stone or stranding around the kidney.  Urinalysis does show bacteria and white cells.  Will send gonorrhea and Chlamydia testing as well as urine culture.  Patient was ordered doxycycline and Rocephin.  Unfortunately, patient eloped after receiving this treatment.  Was unable to provide a prescription for UTI and STD.    (Labs, imaging, consults)  Labs: I Ordered, and personally interpreted labs.  The pertinent results include: CBC, BMP, urinalysis  Imaging Studies ordered: I ordered imaging studies including CT scan I independently visualized and interpreted imaging. I agree with the radiologist interpretation  Additional history obtained from chart  review.  External records from outside source obtained and reviewed including prior evaluations  Cardiac Monitoring: The patient was maintained on a cardiac monitor.  I personally viewed and interpreted the cardiac monitored which showed an underlying rhythm of: Normal sinus rhythm  Reevaluation: After the interventions noted above, I reevaluated the patient and found that they have :stayed the same  Social Determinants of Health: Lives independently  Disposition: Discharge  Co morbidities that complicate the patient evaluation  Past Medical History:  Diagnosis Date   Chronic pain      Medicines Meds ordered this encounter  Medications   ketorolac (TORADOL) 30 MG/ML injection 15 mg   cefTRIAXone (ROCEPHIN) 1  g in sodium chloride 0.9 % 100 mL IVPB    Order Specific Question:   Antibiotic Indication:    Answer:   UTI   doxycycline (VIBRA-TABS) tablet 100 mg    I have reviewed the patients home medicines and have made adjustments as needed  Problem List / ED Course: Problem List Items Addressed This Visit       Other   Dysuria - Primary                Final Clinical Impression(s) / ED Diagnoses Final diagnoses:  Dysuria    Rx / DC Orders ED Discharge Orders     None         Shon Baton, MD 01/20/22 [phone]

## 2022-01-19 NOTE — ED Triage Notes (Signed)
Patient with LLQ abd pain, diarrhea, complaints of painful burning urination since Tuesday. Pt reports intermittent nausea. AOX4.

## 2022-01-20 NOTE — ED Notes (Signed)
Went to round on patient, and iv taken out and patient left room.

## 2022-01-22 ENCOUNTER — Telehealth: Payer: Self-pay

## 2022-01-22 LAB — URINE CULTURE: Culture: NO GROWTH

## 2022-01-22 NOTE — Telephone Encounter (Signed)
RNCM received inbound call from patient requesting his AVS for his 01/19/22 ED visit. Patient requesting to come pick up the AVS. After further review patient eloped/left AMA this RNCM advised patient we are unable to provide a copy of his AVS, referred patient to PCP. Patient verbalized understanding. No additional TOC needs at this time.

## 2022-02-08 ENCOUNTER — Ambulatory Visit (HOSPITAL_COMMUNITY)
Admission: EM | Admit: 2022-02-08 | Discharge: 2022-02-08 | Disposition: A | Discharge status: Home / Self Care | Payer: Medicaid Other | Attending: Family Medicine | Admitting: Family Medicine

## 2022-02-08 ENCOUNTER — Encounter (HOSPITAL_COMMUNITY): Payer: Self-pay | Admitting: *Deleted

## 2022-02-08 DIAGNOSIS — K047 Periapical abscess without sinus: Secondary | ICD-10-CM | POA: Diagnosis not present

## 2022-02-08 DIAGNOSIS — K0510 Chronic gingivitis, plaque induced: Secondary | ICD-10-CM | POA: Diagnosis not present

## 2022-02-08 DIAGNOSIS — R220 Localized swelling, mass and lump, head: Secondary | ICD-10-CM | POA: Diagnosis not present

## 2022-02-08 DIAGNOSIS — K05219 Aggressive periodontitis, localized, unspecified severity: Secondary | ICD-10-CM

## 2022-02-08 MED ORDER — AMOXICILLIN-POT CLAVULANATE 875-125 MG PO TABS: 1.0000 | ORAL_TABLET | Freq: Two times a day (BID) | ORAL | 0 refills | Status: DC

## 2022-02-08 MED ORDER — PREDNISONE 20 MG PO TABS: 20.0000 mg | ORAL_TABLET | Freq: Every day | ORAL | 0 refills | Status: AC

## 2022-02-08 MED ORDER — CHLORHEXIDINE GLUCONATE 0.12 % MT SOLN: 15.0000 mL | Freq: Two times a day (BID) | OROMUCOSAL | 0 refills | Status: AC

## 2022-02-08 NOTE — Discharge Instructions (Addendum)
If Peridex is not covered by insurance, purchase OTC Listerine, rinse mouth at least 3 times daily for 4 days. Take tylenol for pain as prescribed prednisone to reduce facial swelling and gum swelling. Complete entire course of course of antibiotics.

## 2022-02-08 NOTE — ED Triage Notes (Signed)
Patient complains of left sided dental pain X 3 days. Took IBU without relief.

## 2022-02-08 NOTE — ED Provider Notes (Signed)
[facility]    CSN: [id number] Arrival date & time: 02/08/22  0801      History   Chief Complaint Chief Complaint  Patient presents with   Oral Swelling    HPI Steve Anderson is a 61 y.o. male.   HPI Patient presents today for evaluation of lower left facial swelling and lower bottom gingival swelling and pain.  Patient has a broken tooth on his left lower second molar tooth.  He also has poor dentition and has not recently seen a dental provider.  He reports over the last 3 days increasing swelling at the left lower mandible. He has been taking ibuprofen without any relief of pain.  Denies any fever.   Past Medical History:  Diagnosis Date   Chronic pain     Patient Active Problem List   Diagnosis Date Noted   Chronic bilateral low back pain with left-sided sciatica 01/25/2021   Chronic pain of left ankle 01/25/2021   Dysuria 07/07/2013   Possible exposure to STD 07/07/2013   LLQ pain 07/07/2013    Past Surgical History:  Procedure Laterality Date   ANKLE FRACTURE SURGERY Left    ORTHOPEDIC SURGERY         Home Medications    Prior to Admission medications   Medication Sig Start Date End Date Taking? Authorizing Provider  amoxicillin-clavulanate (AUGMENTIN) 875-125 MG tablet Take 1 tablet by mouth every 12 (twelve) hours. 02/08/22  Yes Bing Neighbors, FNP  chlorhexidine (PERIDEX) 0.12 % solution Use as directed 15 mLs in the mouth or throat 2 (two) times daily for 4 days. 02/08/22 02/12/22 Yes Bing Neighbors, FNP  predniSONE (DELTASONE) 20 MG tablet Take 1 tablet (20 mg total) by mouth daily for 5 days. 02/08/22 02/13/22 Yes Bing Neighbors, FNP  acetaminophen (TYLENOL) 500 MG tablet Take 1,000 mg by mouth every 6 (six) hours as needed for mild pain or headache.    [provider]  fluticasone (FLONASE) 50 MCG/ACT nasal spray Place 2 sprays into both nostrils daily. Patient not taking: Reported on 06/21/2017 12/27/16 07/14/19   Muthersbaugh, Dahlia Client, PA-C    Family History Family History  Problem Relation Age of Onset   Hypertension Mother    Brain cancer Mother        not clear if actually malignant tumor   Hypertension Father    Cancer Father        Throat    Social History Social History   Tobacco Use   Smoking status: Every Day    Packs/day: 0.50    Types: Cigarettes   Smokeless tobacco: Never  Vaping Use   Vaping Use: Never used  Substance Use Topics   Alcohol use: Not Currently    Alcohol/week: 28.0 standard drinks of alcohol    Types: 28 Cans of beer per week    Comment: daily   Drug use: Not Currently     Allergies   Patient has no known allergies.   Review of Systems Review of Systems Pertinent negatives listed in HPI   Physical Exam Triage Vital Signs ED Triage Vitals  Enc Vitals Group     BP 02/08/22 0813 (!) 157/90     Pulse Rate 02/08/22 0813 88     Resp 02/08/22 0813 18     Temp 02/08/22 0813 98.1 F (36.7 C)     Temp Source 02/08/22 0813 Oral     SpO2 02/08/22 0813 97 %     Weight --  Height --      Head Circumference --      Peak Flow --      Pain Score 02/08/22 0812 10     Pain Loc --      Pain Edu? --      Excl. in [place]? --    No data found.  Updated Vital Signs BP (!) 157/90 (BP Location: Left Arm)   Pulse 88   Temp 98.1 F (36.7 C) (Oral)   Resp 18   SpO2 97%   Visual Acuity Right Eye Distance:   Left Eye Distance:   Bilateral Distance:    Right Eye Near:   Left Eye Near:    Bilateral Near:     Physical Exam Constitutional:      General: He is not in acute distress.    Appearance: He is not toxic-appearing.  HENT:     Head: Normocephalic and atraumatic.     Jaw: Tenderness and swelling present.   Cardiovascular:     Rate and Rhythm: Normal rate and regular rhythm.  Pulmonary:     Effort: Pulmonary effort is normal.     Breath sounds: Normal breath sounds.  Neurological:     Mental Status: He is alert.     GCS: GCS eye  subscore is 4. GCS verbal subscore is 5. GCS motor subscore is 6.  Psychiatric:        Attention and Perception: Attention normal.        Mood and Affect: Mood normal.        Speech: Speech normal.      UC Treatments / Results  Labs (all labs ordered are listed, but only abnormal results are displayed) Labs Reviewed - No data to display  EKG   Radiology No results found.  Procedures Procedures (including critical care time)  Medications Ordered in UC Medications - No data to display  Initial Impression / Assessment and Plan / UC Course  I have reviewed the triage vital signs and the nursing notes.  Pertinent labs & imaging results that were available during my care of the patient were reviewed by me and considered in my medical decision making (see chart for details).    Treatment per discharge medication and discharge instructions.  Patient vies return if any of his symptoms worsen or do not improve with prescribed treatment.  Dental resources provided. Final Clinical Impressions(s) / UC Diagnoses   Final diagnoses:  Left facial swelling  Gingivitis  Gum abscess  Dental infection     Discharge Instructions      If Peridex is not covered by insurance, purchase OTC Listerine, rinse mouth at least 3 times daily for 4 days. Take tylenol for pain as prescribed prednisone to reduce facial swelling and gum swelling. Complete entire course of course of antibiotics.     ED Prescriptions     Medication Sig Dispense Auth. Provider   chlorhexidine (PERIDEX) 0.12 % solution Use as directed 15 mLs in the mouth or throat 2 (two) times daily for 4 days. 120 mL Bing Neighbors, FNP   amoxicillin-clavulanate (AUGMENTIN) 875-125 MG tablet Take 1 tablet by mouth every 12 (twelve) hours. 14 tablet Bing Neighbors, FNP   predniSONE (DELTASONE) 20 MG tablet Take 1 tablet (20 mg total) by mouth daily for 5 days. 5 tablet Bing Neighbors, FNP      PDMP not reviewed this  encounter.   Bing Neighbors, [place] 02/08/22 [phone]

## 2022-07-14 ENCOUNTER — Ambulatory Visit (HOSPITAL_COMMUNITY)
Admission: EM | Admit: 2022-07-14 | Discharge: 2022-07-14 | Disposition: A | Discharge status: Home / Self Care | Payer: Medicaid Other | Attending: Physician Assistant | Admitting: Physician Assistant

## 2022-07-14 ENCOUNTER — Encounter (HOSPITAL_COMMUNITY): Payer: Self-pay

## 2022-07-14 DIAGNOSIS — H6993 Unspecified Eustachian tube disorder, bilateral: Secondary | ICD-10-CM | POA: Diagnosis not present

## 2022-07-14 DIAGNOSIS — J0110 Acute frontal sinusitis, unspecified: Secondary | ICD-10-CM | POA: Diagnosis not present

## 2022-07-14 MED ORDER — FLUTICASONE PROPIONATE 50 MCG/ACT NA SUSP: 1.0000 | Freq: Every day | NASAL | 0 refills | Status: DC

## 2022-07-14 MED ORDER — AMOXICILLIN-POT CLAVULANATE 875-125 MG PO TABS: 1.0000 | ORAL_TABLET | Freq: Two times a day (BID) | ORAL | 0 refills | Status: DC

## 2022-07-14 NOTE — ED Provider Notes (Signed)
[facility]    CSN: [id number] Arrival date & time: 07/14/22  1540      History   Chief Complaint Chief Complaint  Patient presents with   Ear Pain   Headache    HPI Steve Anderson is a 62 y.o. male.   Patient here today for evaluation of bilateral ear pain and frontal headache that is been ongoing for the last 2 weeks.  He has had some congestion.  He denies any fever.  He has not had any vomiting or diarrhea.  He has not tried any medication for symptoms.  The history is provided by the patient.  Headache Associated symptoms: congestion, ear pain and sinus pressure   Associated symptoms: no abdominal pain, no cough, no fever, no nausea, no sore throat and no vomiting     Past Medical History:  Diagnosis Date   Chronic pain     Patient Active Problem List   Diagnosis Date Noted   Chronic bilateral low back pain with left-sided sciatica 01/25/2021   Chronic pain of left ankle 01/25/2021   Dysuria 07/07/2013   Possible exposure to STD 07/07/2013   LLQ pain 07/07/2013    Past Surgical History:  Procedure Laterality Date   ANKLE FRACTURE SURGERY Left    ORTHOPEDIC SURGERY         Home Medications    Prior to Admission medications   Medication Sig Start Date End Date Taking? Authorizing Provider  amoxicillin-clavulanate (AUGMENTIN) 875-125 MG tablet Take 1 tablet by mouth every 12 (twelve) hours. 07/14/22  Yes Francene Finders, PA-C  fluticasone (FLONASE) 50 MCG/ACT nasal spray Place 1 spray into both nostrils daily. 07/14/22  Yes Francene Finders, PA-C  acetaminophen (TYLENOL) 500 MG tablet Take 1,000 mg by mouth every 6 (six) hours as needed for mild pain or headache.    [provider]    Family History Family History  Problem Relation Age of Onset   Hypertension Mother    Brain cancer Mother        not clear if actually malignant tumor   Hypertension Father    Cancer Father        Throat    Social History Social History    Tobacco Use   Smoking status: Every Day    Packs/day: 0.50    Types: Cigarettes   Smokeless tobacco: Never  Vaping Use   Vaping Use: Never used  Substance Use Topics   Alcohol use: Not Currently    Alcohol/week: 28.0 standard drinks of alcohol    Types: 28 Cans of beer per week    Comment: daily   Drug use: Not Currently     Allergies   Patient has no known allergies.   Review of Systems Review of Systems  Constitutional:  Negative for chills and fever.  HENT:  Positive for congestion, ear pain and sinus pressure. Negative for sore throat.   Eyes:  Negative for discharge and redness.  Respiratory:  Negative for cough and shortness of breath.   Gastrointestinal:  Negative for abdominal pain, nausea and vomiting.  Neurological:  Positive for headaches.     Physical Exam Triage Vital Signs ED Triage Vitals [07/14/22 0953]  Enc Vitals Group     BP (!) 170/89     Pulse Rate 61     Resp 18     Temp 98.6 F (37 C)     Temp Source Oral     SpO2 98 %  Weight      Height      Head Circumference      Peak Flow      Pain Score      Pain Loc      Pain Edu?      Excl. in [place]?    No data found.  Updated Vital Signs BP (!) 170/89 (BP Location: Left Arm)   Pulse 61   Temp 98.6 F (37 C) (Oral)   Resp 18   SpO2 98%      Physical Exam Vitals and nursing note reviewed.  Constitutional:      General: He is not in acute distress.    Appearance: Normal appearance. He is not ill-appearing.  HENT:     Head: Normocephalic and atraumatic.     Ears:     Comments: Bilateral Tms dull    Nose: Congestion present.     Mouth/Throat:     Mouth: Mucous membranes are moist.     Pharynx: Oropharynx is clear. No oropharyngeal exudate or posterior oropharyngeal erythema.  Eyes:     Conjunctiva/sclera: Conjunctivae normal.  Cardiovascular:     Rate and Rhythm: Normal rate and regular rhythm.     Heart sounds: Normal heart sounds. No murmur heard. Pulmonary:     Effort:  Pulmonary effort is normal. No respiratory distress.     Breath sounds: Normal breath sounds. No wheezing, rhonchi or rales.  Skin:    General: Skin is warm and dry.  Neurological:     Mental Status: He is alert.  Psychiatric:        Mood and Affect: Mood normal.        Thought Content: Thought content normal.      UC Treatments / Results  Labs (all labs ordered are listed, but only abnormal results are displayed) Labs Reviewed - No data to display  EKG   Radiology No results found.  Procedures Procedures (including critical care time)  Medications Ordered in UC Medications - No data to display  Initial Impression / Assessment and Plan / UC Course  I have reviewed the triage vital signs and the nursing notes.  Pertinent labs & imaging results that were available during my care of the patient were reviewed by me and considered in my medical decision making (see chart for details).    Suspect likely frontal sinusitis causing frontal headache.  Will treat with Augmentin and Flonase prescribed for treatment of suspected eustachian tube dysfunction.  Encouraged follow-up if symptoms fail to improve or worsen in any way.  Patient expresses understanding.  Final Clinical Impressions(s) / UC Diagnoses   Final diagnoses:  Acute non-recurrent frontal sinusitis  ETD (Eustachian tube dysfunction), bilateral   Discharge Instructions   None    ED Prescriptions     Medication Sig Dispense Auth. Provider   amoxicillin-clavulanate (AUGMENTIN) 875-125 MG tablet Take 1 tablet by mouth every 12 (twelve) hours. 14 tablet Ewell Poe F, PA-C   fluticasone [facility]) 50 MCG/ACT nasal spray Place 1 spray into both nostrils daily. 15.8 mL Francene Finders, PA-C      PDMP not reviewed this encounter.   Francene Finders, PA-C 07/14/22 1227

## 2022-07-14 NOTE — ED Triage Notes (Signed)
2- week h/o bilateral ear pain and headache. Pt is not taking any medication for his symptoms.

## 2022-09-25 IMAGING — CT CT RENAL STONE PROTOCOL
2 of 4 series · 17 of 46 positions shown, 19 images · non-contrast
Comparison: CT August 19, 2021.

CLINICAL DATA: Left flank pain concern for renal stone



[Series 3: renal stone 5.0 · axial · 0.81mm/px · z∈[+904,+1309]mm · 14 of 89 slices shown, 16 images]
[im 4/89  soft-tissue]
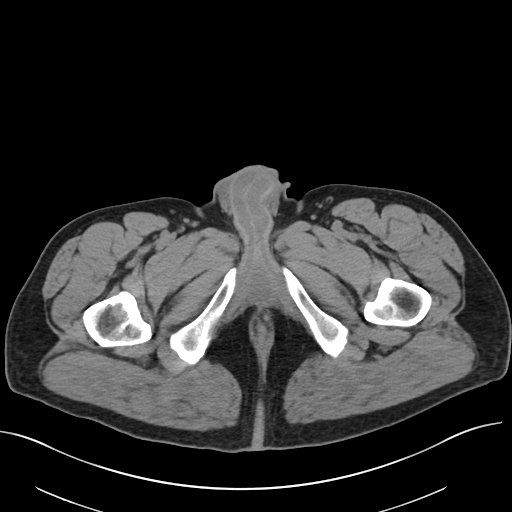
[im 4/89  bone]
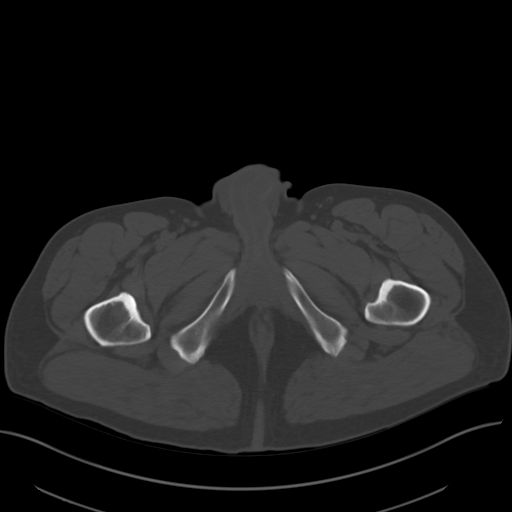
[im 11/89  soft-tissue]
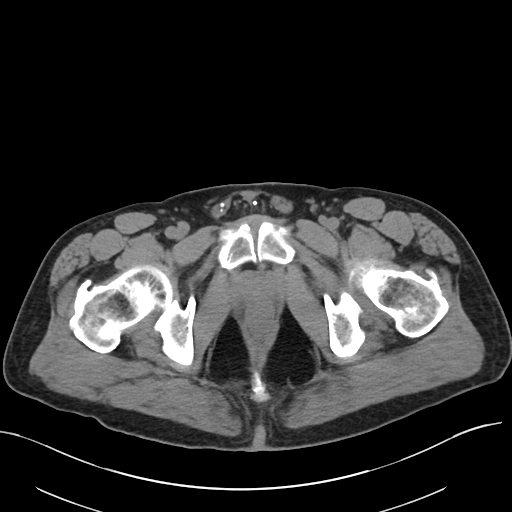
[im 18/89  soft-tissue]
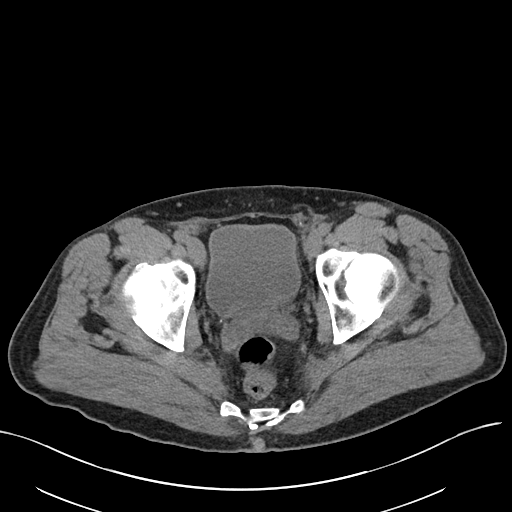
[im 25/89  soft-tissue]
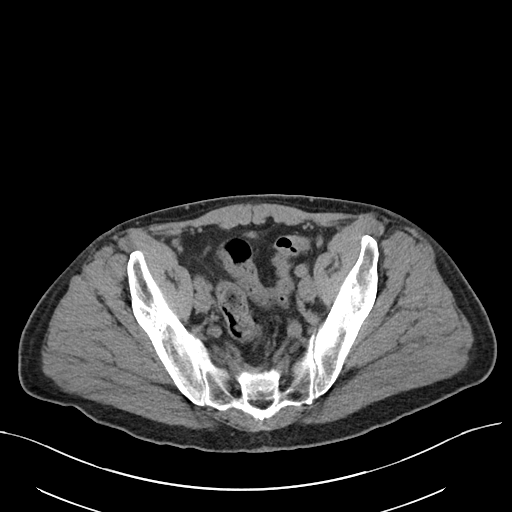
[im 29/89  soft-tissue]
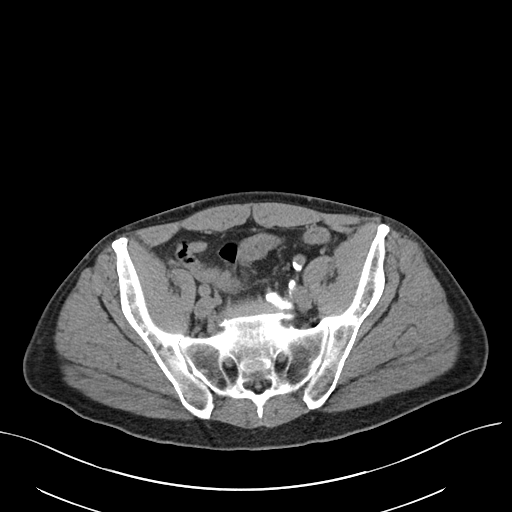
[im 36/89  soft-tissue]
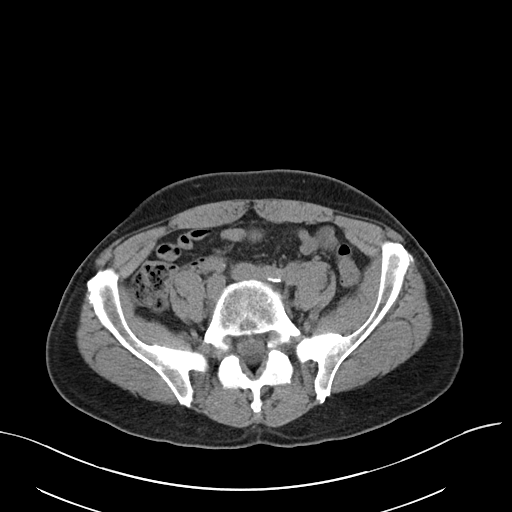
[im 43/89  soft-tissue]
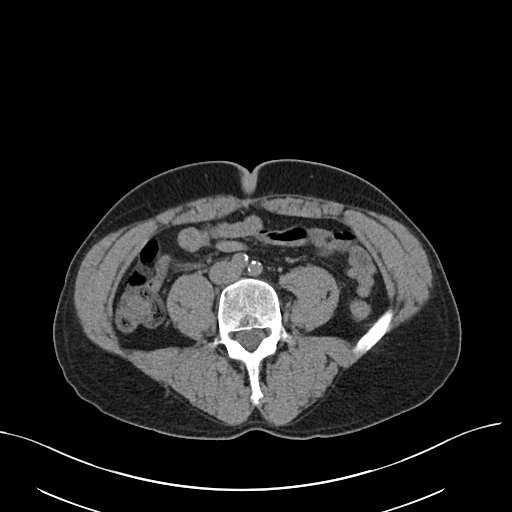
[im 46/89  soft-tissue]
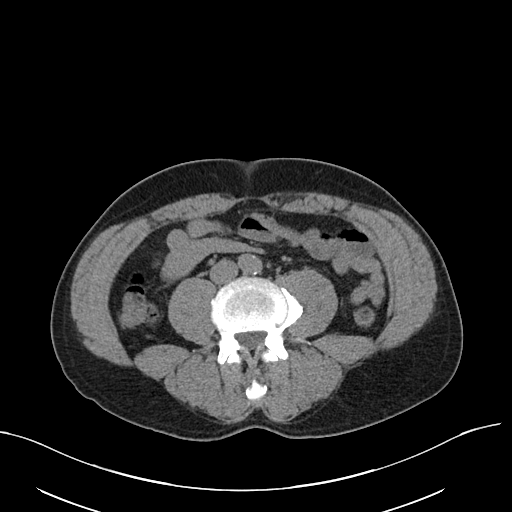
[im 53/89  soft-tissue]
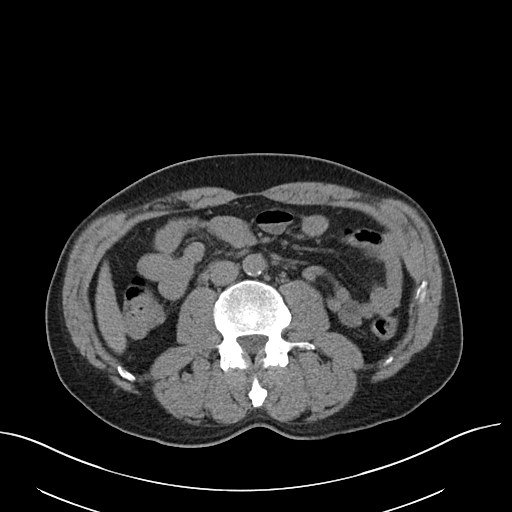
[im 53/89  bone]
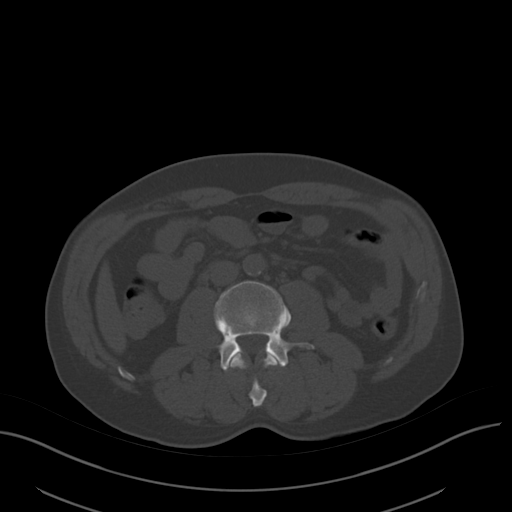
[im 60/89  soft-tissue]
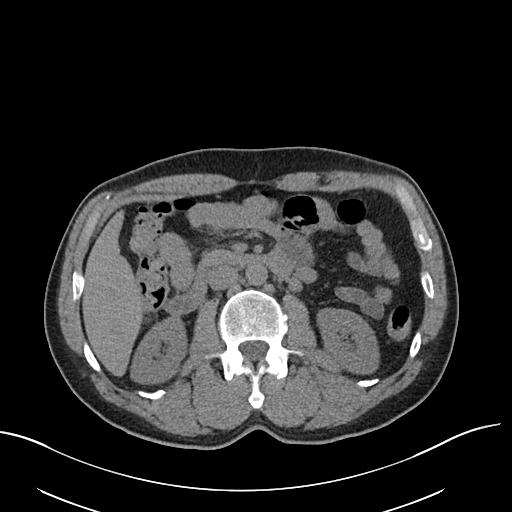
[im 67/89  soft-tissue]
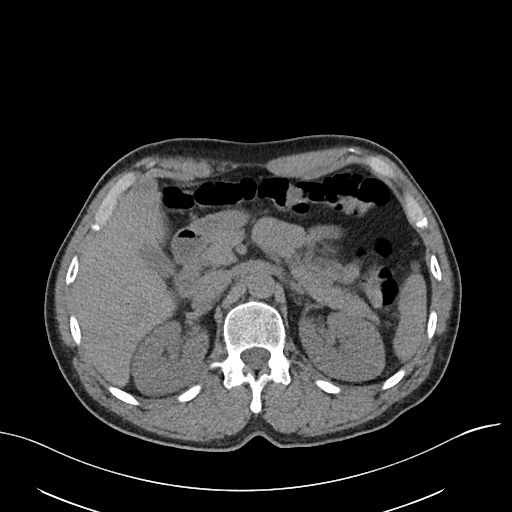
[im 71/89  soft-tissue]
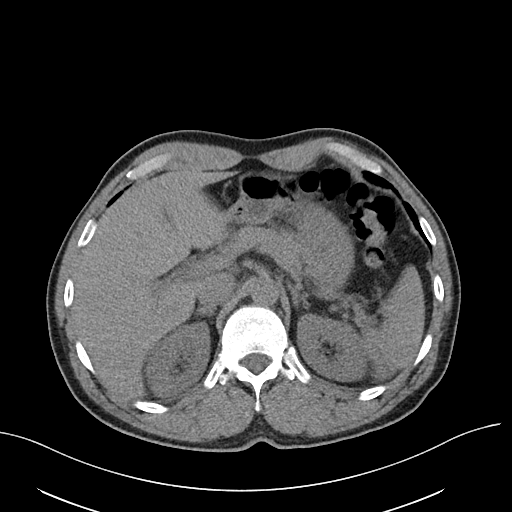
[im 78/89  soft-tissue]
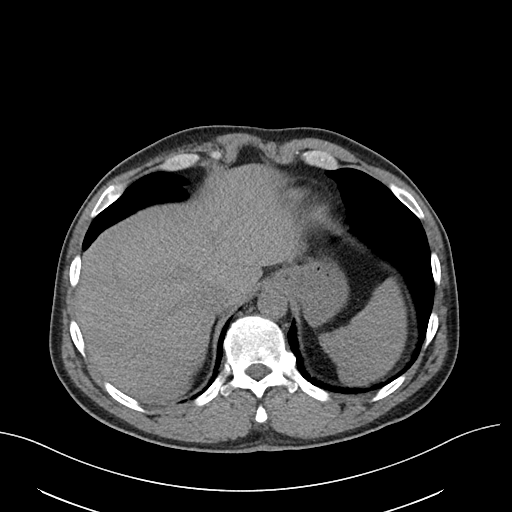
[im 85/89  soft-tissue]
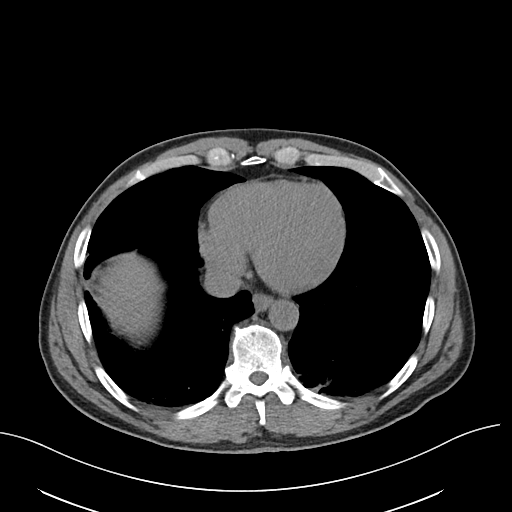

[Series 6: cor · coronal · 0.82mm/px · 3 of 141 slices shown]
[im 47/141  soft-tissue]
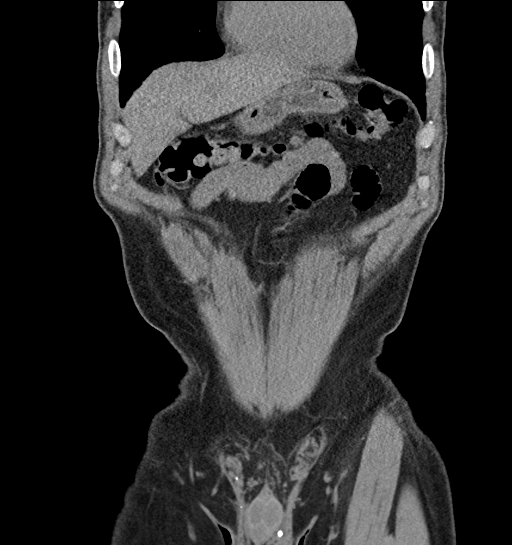
[im 63/141  soft-tissue]
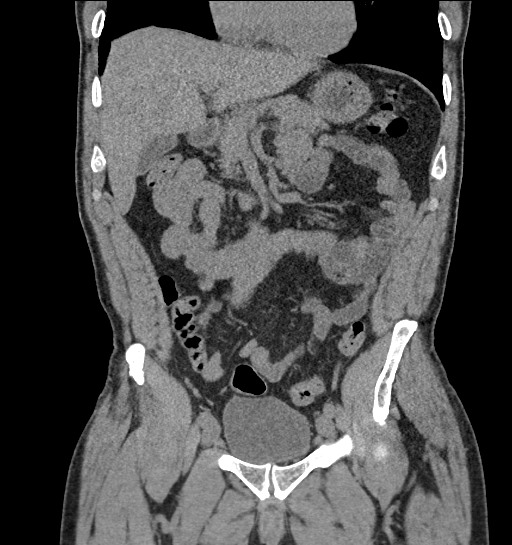
[im 78/141  soft-tissue]
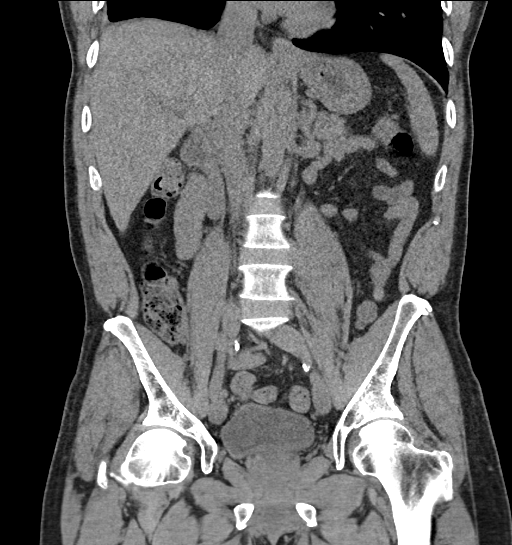

[17 of 46 positions shown; findings below may reference images not displayed]

FINDINGS: Lower chest: No acute abnormality. No emphysematous change.
Scarring/atelectasis in the lung bases.

Hepatobiliary: Unremarkable noncontrast appearance of the hepatic
parenchyma. Gallbladder is unremarkable. No biliary ductal dilation.

Pancreas: Pancreatic ductal dilation or evidence of acute
inflammation.

Spleen: No splenomegaly or focal splenic lesion.

Adrenals/Urinary Tract: Adrenal glands are unremarkable. Kidneys are
normal, without renal calculi, contour deforming lesion, or
hydronephrosis. Bladder is unremarkable.

Stomach/Bowel: Stomach is within normal limits. Appendix appears
normal. No evidence of bowel wall thickening, distention, or
inflammatory changes.

Vascular/Lymphatic: Aortic atherosclerosis. No enlarged abdominal or
pelvic lymph nodes.

Reproductive: Prostate is unremarkable.

Other: No abdominal wall hernia or abnormality. No abdominopelvic
ascites.

Musculoskeletal: Chronic bilateral pars defects at L5-S1 with grade
1 anterolisthesis.
IMPRESSION: 1. No acute intra-abdominal pathology. Specifically, no evidence of
nephrolithiasis or obstructive uropathy.
2. Aortic Atherosclerosis ([id number]).

## 2023-04-06 ENCOUNTER — Telehealth: Payer: Self-pay

## 2023-04-06 NOTE — Telephone Encounter (Signed)
Patient called asking for an appointment due to back pain. Patient has not been here since August of 2022. Informed patient it has been 2 years since he has been seen and would need to make a new patient appointment to re-establish. Patient would like to be on wait list if there is any cancellations.   We will call patient if there is a cancellation.

## 2023-05-26 ENCOUNTER — Emergency Department (HOSPITAL_COMMUNITY)
Admission: EM | Admit: 2023-05-26 | Discharge: 2023-05-26 | Disposition: A | Discharge status: Home / Self Care | Payer: Medicaid Other | Attending: Emergency Medicine | Admitting: Emergency Medicine

## 2023-05-26 ENCOUNTER — Encounter (HOSPITAL_COMMUNITY): Payer: Self-pay | Admitting: *Deleted

## 2023-05-26 ENCOUNTER — Other Ambulatory Visit: Payer: Self-pay

## 2023-05-26 ENCOUNTER — Emergency Department (HOSPITAL_COMMUNITY): Payer: Medicaid Other

## 2023-05-26 DIAGNOSIS — M545 Low back pain, unspecified: Secondary | ICD-10-CM

## 2023-05-26 DIAGNOSIS — M479 Spondylosis, unspecified: Secondary | ICD-10-CM | POA: Diagnosis not present

## 2023-05-26 MED ORDER — METHOCARBAMOL 500 MG PO TABS: 500.0000 mg | ORAL_TABLET | Freq: Two times a day (BID) | ORAL | 0 refills | Status: AC | PRN

## 2023-05-26 MED ORDER — HYDROCODONE-ACETAMINOPHEN 5-325 MG PO TABS
1.0000 | ORAL_TABLET | Freq: Once | ORAL | Status: AC
  Administered 2023-05-26: 1 via ORAL
  Filled 2023-05-26: qty 1

## 2023-05-26 MED ORDER — IBUPROFEN 400 MG PO TABS
600.0000 mg | ORAL_TABLET | Freq: Once | ORAL | Status: AC
  Administered 2023-05-26: 600 mg via ORAL
  Filled 2023-05-26: qty 1

## 2023-05-26 MED ORDER — IBUPROFEN 600 MG PO TABS: 600.0000 mg | ORAL_TABLET | Freq: Four times a day (QID) | ORAL | 0 refills | Status: DC | PRN
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
## 2023-05-30 ENCOUNTER — Emergency Department (HOSPITAL_COMMUNITY): Payer: Medicaid Other

## 2023-05-30 ENCOUNTER — Emergency Department (HOSPITAL_COMMUNITY)
Admission: EM | Admit: 2023-05-30 | Discharge: 2023-05-30 | Disposition: A | Discharge status: Home / Self Care | Payer: Medicaid Other | Attending: Emergency Medicine | Admitting: Emergency Medicine

## 2023-05-30 DIAGNOSIS — Y92410 Unspecified street and highway as the place of occurrence of the external cause: Secondary | ICD-10-CM | POA: Insufficient documentation

## 2023-05-30 DIAGNOSIS — R10819 Abdominal tenderness, unspecified site: Secondary | ICD-10-CM | POA: Diagnosis not present

## 2023-05-30 DIAGNOSIS — S32030A Wedge compression fracture of third lumbar vertebra, initial encounter for closed fracture: Secondary | ICD-10-CM | POA: Insufficient documentation

## 2023-05-30 DIAGNOSIS — S34109A Unspecified injury to unspecified level of lumbar spinal cord, initial encounter: Secondary | ICD-10-CM | POA: Diagnosis present

## 2023-05-30 LAB — BASIC METABOLIC PANEL
Anion gap: 8 (ref 5–15)
BUN: 12 mg/dL (ref 8–23)
CO2: 20 mmol/L — ABNORMAL LOW (ref 22–32)
Calcium: 9.3 mg/dL (ref 8.9–10.3)
Chloride: 111 mmol/L (ref 98–111)
Creatinine, Ser: 0.94 mg/dL (ref 0.61–1.24)
GFR, Estimated: >60 mL/min (ref >60)
Glucose, Bld: 106 mg/dL — ABNORMAL HIGH (ref 70–99)
Potassium: 4.3 mmol/L (ref 3.5–5.1)
Sodium: 139 mmol/L (ref 135–145)

## 2023-05-30 LAB — URINALYSIS, ROUTINE W REFLEX MICROSCOPIC
Bilirubin Urine: NEGATIVE
Glucose, UA: NEGATIVE mg/dL
Hgb urine dipstick: NEGATIVE
Ketones, ur: NEGATIVE mg/dL
Leukocytes,Ua: NEGATIVE
Nitrite: NEGATIVE
Protein, ur: NEGATIVE mg/dL
Specific Gravity, Urine: 1.014 (ref 1.005–1.030)
pH: 5 (ref 5.0–8.0)

## 2023-05-30 LAB — TYPE AND SCREEN
ABO/RH(D): B POS
Antibody Screen: NEGATIVE

## 2023-05-30 LAB — CBC
HCT: 43.6 % (ref 39.0–52.0)
Hemoglobin: 15.1 g/dL (ref 13.0–17.0)
MCH: 32.4 pg (ref 26.0–34.0)
MCHC: 34.6 g/dL (ref 30.0–36.0)
MCV: 93.6 fL (ref 80.0–100.0)
Platelets: 140 10*3/uL — ABNORMAL LOW (ref 150–400)
RBC: 4.66 MIL/uL (ref 4.22–5.81)
RDW: 12.8 % (ref 11.5–15.5)
WBC: 3.7 10*3/uL — ABNORMAL LOW (ref 4.0–10.5)
nRBC: 0 % (ref 0.0–0.2)

## 2023-05-30 MED ORDER — SALONPAS 3.1-6-10 % EX PTCH: 1.0000 | MEDICATED_PATCH | Freq: Two times a day (BID) | CUTANEOUS | 0 refills | Status: AC

## 2023-05-30 MED ORDER — DICLOFENAC EPOLAMINE 1.3 % EX PTCH
1.0000 | MEDICATED_PATCH | Freq: Two times a day (BID) | CUTANEOUS | Status: DC
  Administered 2023-05-30: 1 via TRANSDERMAL
  Filled 2023-05-30 (×2): qty 1

## 2023-05-30 MED ORDER — MORPHINE SULFATE (PF) 4 MG/ML IV SOLN
4.0000 mg | Freq: Once | INTRAVENOUS | Status: AC
  Administered 2023-05-30: 4 mg via INTRAVENOUS
  Filled 2023-05-30: qty 1

## 2023-05-30 MED ORDER — KETOROLAC TROMETHAMINE 30 MG/ML IJ SOLN
15.0000 mg | Freq: Once | INTRAMUSCULAR | Status: AC
  Administered 2023-05-30: 15 mg via INTRAVENOUS
  Filled 2023-05-30: qty 1

## 2023-05-30 MED ORDER — IOHEXOL 350 MG/ML SOLN
75.0000 mL | Freq: Once | INTRAVENOUS | Status: AC | PRN
  Administered 2023-05-30: 75 mL via INTRAVENOUS

## 2023-05-30 MED ORDER — MORPHINE SULFATE (PF) 4 MG/ML IV SOLN
4.0000 mg | Freq: Once | INTRAVENOUS | Status: AC
Start: 2023-05-30 — End: 2023-05-30
  Administered 2023-05-30: 4 mg via INTRAVENOUS
  Filled 2023-05-30: qty 1

## 2023-05-30 MED ORDER — HYDROCODONE-ACETAMINOPHEN 5-325 MG PO TABS: 2.0000 | ORAL_TABLET | ORAL | 0 refills | Status: DC | PRN

## 2023-05-30 NOTE — ED Triage Notes (Signed)
Pt. Stated, Ive had black stool for 4 days with some kidney pain. Sometimes its hard to catch my breath.
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
## 2023-05-30 NOTE — ED Notes (Signed)
Patient discharged by this RN. Patient verbalizes understanding of discharge instructions with no additional questions for this RN. Patient in wheelchair to lobby at time of discharge to be taken home by sister.

## 2023-05-30 NOTE — ED Notes (Signed)
Patient transported to CT 

## 2023-05-30 NOTE — ED Notes (Signed)
Awaiting diclofenac patch from main pharmacy.

## 2023-05-30 NOTE — ED Notes (Signed)
Awaiting patient from lobby 

## 2023-06-07 ENCOUNTER — Other Ambulatory Visit: Payer: Self-pay

## 2023-06-07 ENCOUNTER — Emergency Department (HOSPITAL_COMMUNITY)
Admission: EM | Admit: 2023-06-07 | Discharge: 2023-06-08 | Disposition: A | Discharge status: Home / Self Care | Payer: Medicaid Other | Attending: Emergency Medicine | Admitting: Emergency Medicine

## 2023-06-07 ENCOUNTER — Emergency Department (HOSPITAL_COMMUNITY): Payer: Medicaid Other

## 2023-06-07 ENCOUNTER — Encounter (HOSPITAL_COMMUNITY): Payer: Self-pay

## 2023-06-07 DIAGNOSIS — S3091XA Unspecified superficial injury of lower back and pelvis, initial encounter: Secondary | ICD-10-CM | POA: Diagnosis present

## 2023-06-07 DIAGNOSIS — X58XXXA Exposure to other specified factors, initial encounter: Secondary | ICD-10-CM | POA: Insufficient documentation

## 2023-06-07 DIAGNOSIS — S32030A Wedge compression fracture of third lumbar vertebra, initial encounter for closed fracture: Secondary | ICD-10-CM | POA: Diagnosis not present

## 2023-06-07 DIAGNOSIS — R109 Unspecified abdominal pain: Secondary | ICD-10-CM | POA: Diagnosis not present

## 2023-06-07 LAB — COMPREHENSIVE METABOLIC PANEL
ALT: 17 U/L (ref 0–44)
AST: 17 U/L (ref 15–41)
Albumin: 3.2 g/dL — ABNORMAL LOW (ref 3.5–5.0)
Alkaline Phosphatase: 89 U/L (ref 38–126)
Anion gap: 6 (ref 5–15)
BUN: 12 mg/dL (ref 8–23)
CO2: 20 mmol/L — ABNORMAL LOW (ref 22–32)
Calcium: 8.9 mg/dL (ref 8.9–10.3)
Chloride: 111 mmol/L (ref 98–111)
Creatinine, Ser: 0.78 mg/dL (ref 0.61–1.24)
GFR, Estimated: >60 mL/min (ref >60)
Glucose, Bld: 99 mg/dL (ref 70–99)
Potassium: 4.1 mmol/L (ref 3.5–5.1)
Sodium: 137 mmol/L (ref 135–145)
Total Bilirubin: 0.6 mg/dL (ref <1.2)
Total Protein: 6.1 g/dL — ABNORMAL LOW (ref 6.5–8.1)

## 2023-06-07 LAB — CBC WITH DIFFERENTIAL/PLATELET
Abs Immature Granulocytes: 0.01 10*3/uL (ref 0.00–0.07)
Basophils Absolute: 0.1 10*3/uL (ref 0.0–0.1)
Basophils Relative: 1 %
Eosinophils Absolute: 0 10*3/uL (ref 0.0–0.5)
Eosinophils Relative: 1 %
HCT: 40.7 % (ref 39.0–52.0)
Hemoglobin: 14.3 g/dL (ref 13.0–17.0)
Immature Granulocytes: 0 %
Lymphocytes Relative: 43 %
Lymphs Abs: 1.9 10*3/uL (ref 0.7–4.0)
MCH: 32.3 pg (ref 26.0–34.0)
MCHC: 35.1 g/dL (ref 30.0–36.0)
MCV: 91.9 fL (ref 80.0–100.0)
Monocytes Absolute: 0.4 10*3/uL (ref 0.1–1.0)
Monocytes Relative: 9 %
Neutro Abs: 2.1 10*3/uL (ref 1.7–7.7)
Neutrophils Relative %: 46 %
Platelets: 174 10*3/uL (ref 150–400)
RBC: 4.43 MIL/uL (ref 4.22–5.81)
RDW: 12.6 % (ref 11.5–15.5)
WBC: 4.5 10*3/uL (ref 4.0–10.5)
nRBC: 0 % (ref 0.0–0.2)

## 2023-06-07 LAB — URINALYSIS, ROUTINE W REFLEX MICROSCOPIC
Bilirubin Urine: NEGATIVE
Glucose, UA: NEGATIVE mg/dL
Hgb urine dipstick: NEGATIVE
Ketones, ur: NEGATIVE mg/dL
Leukocytes,Ua: NEGATIVE
Nitrite: NEGATIVE
Protein, ur: NEGATIVE mg/dL
Specific Gravity, Urine: 1.015 (ref 1.005–1.030)
pH: 5 (ref 5.0–8.0)

## 2023-06-07 LAB — LIPASE, BLOOD: Lipase: 51 U/L (ref 11–51)

## 2023-06-07 MED ORDER — ONDANSETRON 4 MG PO TBDP
4.0000 mg | ORAL_TABLET | Freq: Once | ORAL | Status: AC
  Administered 2023-06-07: 4 mg via ORAL
  Filled 2023-06-07: qty 1

## 2023-06-07 MED ORDER — OXYCODONE-ACETAMINOPHEN 5-325 MG PO TABS
2.0000 | ORAL_TABLET | Freq: Once | ORAL | Status: AC
  Administered 2023-06-07: 2 via ORAL
  Filled 2023-06-07: qty 2

## 2023-06-07 NOTE — ED Triage Notes (Signed)
Pt arrived POV from home c/o lower abdominal pain that radiates to his back and has not been able to have a bowel movement in 4 days.
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
## 2023-06-08 MED ORDER — OXYCODONE-ACETAMINOPHEN 5-325 MG PO TABS: 1.0000 | ORAL_TABLET | Freq: Four times a day (QID) | ORAL | 0 refills | Status: DC | PRN

## 2023-06-08 MED ORDER — LIDOCAINE 5 % EX PTCH: 1.0000 | MEDICATED_PATCH | CUTANEOUS | 0 refills | Status: DC

## 2023-06-08 MED ORDER — POLYETHYLENE GLYCOL 3350 17 G PO PACK: 17.0000 g | PACK | Freq: Every day | ORAL | 0 refills | Status: DC
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
## 2023-09-10 NOTE — Telephone Encounter (Signed)
 Attempted to call patient a number of times. Have been unable to reach him or get a call back.

## 2024-02-04 ENCOUNTER — Emergency Department (HOSPITAL_COMMUNITY)
Admission: EM | Admit: 2024-02-04 | Discharge: 2024-02-04 | Disposition: A | Discharge status: Home / Self Care | Attending: Emergency Medicine | Admitting: Emergency Medicine

## 2024-02-04 ENCOUNTER — Other Ambulatory Visit: Payer: Self-pay

## 2024-02-04 ENCOUNTER — Encounter (HOSPITAL_COMMUNITY): Payer: Self-pay

## 2024-02-04 DIAGNOSIS — H6692 Otitis media, unspecified, left ear: Secondary | ICD-10-CM | POA: Insufficient documentation

## 2024-02-04 DIAGNOSIS — H6690 Otitis media, unspecified, unspecified ear: Secondary | ICD-10-CM

## 2024-02-04 DIAGNOSIS — H9202 Otalgia, left ear: Secondary | ICD-10-CM | POA: Diagnosis present

## 2024-02-04 MED ORDER — AMOXICILLIN 500 MG PO CAPS: 500.0000 mg | ORAL_CAPSULE | Freq: Three times a day (TID) | ORAL | 0 refills | Status: DC

## 2024-02-04 NOTE — Discharge Instructions (Addendum)
 Return if any problems.

## 2024-02-04 NOTE — ED Triage Notes (Signed)
 Pt states he thinks his left ear is clogged with decreased hearing out of that ear.

## 2024-02-04 NOTE — ED Provider Notes (Signed)
 [place] EMERGENCY DEPARTMENT AT [facility] Provider Note   CSN: [id number] Arrival date & time: 02/04/24  1041     Patient presents with: Steve Anderson is a 63 y.o. male.   Pt complains of discomfort in his left ear.  Pt reports ear feels full and he feels like he has movement in canal.  Pt denies any fever or chills.  No cough of congestion. No sinus drainage or sore throat.   The history is provided by the patient. No language interpreter was used.  Otalgia Location:  Left Behind ear:  No abnormality Quality:  Aching Severity:  Moderate Onset quality:  Sudden Timing:  Constant Progression:  Worsening Chronicity:  New Relieved by:  Nothing Associated symptoms: congestion        Prior to Admission medications   Medication Sig Start Date End Date Taking? Authorizing Provider  acetaminophen  (TYLENOL ) 500 MG tablet Take 1,000 mg by mouth every 6 (six) hours as needed for mild pain or headache.    [provider]  Camphor-Menthol-Methyl Sal (SALONPAS ) 3.06-24-08 % [facility] Apply 1 patch topically in the morning and at bedtime. Patient not taking: Reported on 06/08/2023 05/30/23   Garrick Charleston, MD  HYDROcodone -acetaminophen  (NORCO/VICODIN) 5-325 MG tablet Take 2 tablets by mouth every 4 (four) hours as needed. Patient not taking: Reported on 06/08/2023 05/30/23   Garrick Charleston, MD  ibuprofen  (ADVIL ) 600 MG tablet Take 1 tablet (600 mg total) by mouth every 6 (six) hours as needed. Patient not taking: Reported on 06/08/2023 05/26/23   Dean Clarity, MD  lidocaine  (LIDODERM ) 5 % Place 1 patch onto the skin daily. Remove & Discard patch within 12 hours or as directed by MD 06/08/23   Randol Simmonds, MD  methocarbamol  (ROBAXIN ) 500 MG tablet Take 1 tablet (500 mg total) by mouth 2 (two) times daily as needed. Patient not taking: Reported on 06/08/2023 05/26/23   Dean Clarity, MD  oxyCODONE -acetaminophen  (PERCOCET/ROXICET) 5-325 MG tablet Take 1  tablet by mouth every 6 (six) hours as needed for severe pain (pain score 7-10). 06/08/23   Randol Simmonds, MD  polyethylene glycol (MIRALAX ) 17 g packet Take 17 g by mouth daily. 06/08/23   Randol Simmonds, MD    Allergies: Patient has no known allergies.    Review of Systems  HENT:  Positive for congestion and ear pain.   All other systems reviewed and are negative.   Updated Vital Signs BP 138/79   Pulse 71   Temp 97.9 F (36.6 C)   Resp 14   Ht 5' 9 (1.753 m)   Wt 97.5 kg   SpO2 98%   BMI 31.75 kg/m   Physical Exam Vitals and nursing note reviewed.  Constitutional:      Appearance: He is well-developed.  HENT:     Head: Normocephalic.     Right Ear: Tympanic membrane normal.     Ears:     Comments: Poor landmarks, erythema,      Nose: Congestion present.     Mouth/Throat:     Mouth: Mucous membranes are moist.  Cardiovascular:     Rate and Rhythm: Normal rate.  Pulmonary:     Effort: Pulmonary effort is normal.  Abdominal:     General: There is no distension.  Musculoskeletal:        General: Normal range of motion.  Skin:    General: Skin is warm.  Neurological:     General: No focal deficit present.  Mental Status: He is alert and oriented to person, place, and time.     (all labs ordered are listed, but only abnormal results are displayed) Labs Reviewed - No data to display  EKG: None  Radiology: No results found.   Procedures   Medications Ordered in the ED - No data to display                                  Medical Decision Making Pt complains of left ear discomfort   Risk Prescription drug management.        Final diagnoses:  Acute otitis media, unspecified otitis media type    ED Discharge Orders          Ordered    amoxicillin  (AMOXIL ) 500 MG capsule  3 times daily        02/04/24 1135            An After Visit Summary was printed and given to the patient.    Flint Sonny POUR, PA-C 02/04/24 1135    Emil Share, DO 02/04/24 1137

## 2024-02-08 ENCOUNTER — Other Ambulatory Visit: Payer: Self-pay

## 2024-02-08 ENCOUNTER — Emergency Department (HOSPITAL_COMMUNITY)

## 2024-02-08 ENCOUNTER — Emergency Department (HOSPITAL_COMMUNITY)
Admission: EM | Admit: 2024-02-08 | Discharge: 2024-02-08 | Disposition: A | Discharge status: Home / Self Care | Attending: Emergency Medicine | Admitting: Emergency Medicine

## 2024-02-08 ENCOUNTER — Encounter (HOSPITAL_COMMUNITY): Payer: Self-pay

## 2024-02-08 DIAGNOSIS — R1084 Generalized abdominal pain: Secondary | ICD-10-CM | POA: Insufficient documentation

## 2024-02-08 DIAGNOSIS — D696 Thrombocytopenia, unspecified: Secondary | ICD-10-CM | POA: Diagnosis not present

## 2024-02-08 LAB — CBC
HCT: 40.9 % (ref 39.0–52.0)
Hemoglobin: 14.2 g/dL (ref 13.0–17.0)
MCH: 32.6 pg (ref 26.0–34.0)
MCHC: 34.7 g/dL (ref 30.0–36.0)
MCV: 94 fL (ref 80.0–100.0)
Platelets: 129 K/uL — ABNORMAL LOW (ref 150–400)
RBC: 4.35 MIL/uL (ref 4.22–5.81)
RDW: 13.6 % (ref 11.5–15.5)
WBC: 4 K/uL (ref 4.0–10.5)
nRBC: 0 % (ref 0.0–0.2)

## 2024-02-08 LAB — COMPREHENSIVE METABOLIC PANEL WITH GFR
ALT: 18 U/L (ref 0–44)
AST: 22 U/L (ref 15–41)
Albumin: 3.3 g/dL — ABNORMAL LOW (ref 3.5–5.0)
Alkaline Phosphatase: 52 U/L (ref 38–126)
Anion gap: 8 (ref 5–15)
BUN: 9 mg/dL (ref 8–23)
CO2: 23 mmol/L (ref 22–32)
Calcium: 8.8 mg/dL — ABNORMAL LOW (ref 8.9–10.3)
Chloride: 108 mmol/L (ref 98–111)
Creatinine, Ser: 0.99 mg/dL (ref 0.61–1.24)
GFR, Estimated: >60 mL/min (ref >60)
Glucose, Bld: 97 mg/dL (ref 70–99)
Potassium: 4.2 mmol/L (ref 3.5–5.1)
Sodium: 139 mmol/L (ref 135–145)
Total Bilirubin: 0.6 mg/dL (ref 0.0–1.2)
Total Protein: 6 g/dL — ABNORMAL LOW (ref 6.5–8.1)

## 2024-02-08 LAB — LIPASE, BLOOD: Lipase: 54 U/L — ABNORMAL HIGH (ref 11–51)

## 2024-02-08 LAB — URINALYSIS, ROUTINE W REFLEX MICROSCOPIC
Bilirubin Urine: NEGATIVE
Glucose, UA: NEGATIVE mg/dL
Hgb urine dipstick: NEGATIVE
Ketones, ur: NEGATIVE mg/dL
Leukocytes,Ua: NEGATIVE
Nitrite: NEGATIVE
Protein, ur: NEGATIVE mg/dL
Specific Gravity, Urine: 1.016 (ref 1.005–1.030)
pH: 5 (ref 5.0–8.0)

## 2024-02-08 MED ORDER — FENTANYL CITRATE PF 50 MCG/ML IJ SOSY
25.0000 ug | PREFILLED_SYRINGE | Freq: Once | INTRAMUSCULAR | Status: AC
Start: 2024-02-08 — End: 2024-02-08
  Administered 2024-02-08: 25 ug via INTRAVENOUS
  Filled 2024-02-08: qty 1

## 2024-02-08 MED ORDER — IOHEXOL 350 MG/ML SOLN
75.0000 mL | Freq: Once | INTRAVENOUS | Status: AC | PRN
  Administered 2024-02-08: 75 mL via INTRAVENOUS

## 2024-02-08 MED ORDER — LACTATED RINGERS IV BOLUS
500.0000 mL | Freq: Once | INTRAVENOUS | Status: AC
  Administered 2024-02-08: 500 mL via INTRAVENOUS

## 2024-02-08 NOTE — Discharge Instructions (Signed)
 You were evaluated here in the emergency department for abdominal pain.  It was noted that your lipase is elevated which could represent some irritation of your pancreas.  Please avoid alcohol and fatty foods. Return if you are having any new or worsening pain, fever, inability to tolerate liquids. Your platelets were found to be somewhat low at 129,000.  This also can be caused by alcohol but is unclear what your etiology is today.  Please follow-up with your primary care doctor next week for recheck of your platelets and reevaluation of your abdominal pain. Return if you are having new or worsening symptoms in the meantime.

## 2024-02-08 NOTE — ED Notes (Signed)
Pt awaiting provider exam

## 2024-02-08 NOTE — ED Notes (Signed)
 Provided pt with urinal and made aware of urine sample

## 2024-02-08 NOTE — ED Notes (Signed)
 Hourly rounding complete. Pt alert or resting, no distress noted, offered toileting and diet as appropriate. Side rails up, call light within reach. Pt denies pain or further needs at this time.

## 2024-02-08 NOTE — ED Notes (Signed)
 PT verbalized understanding of DC instructions.Pt amb out of ED with all belongings

## 2024-02-08 NOTE — ED Provider Notes (Signed)
 [place] EMERGENCY DEPARTMENT AT [facility] Provider Note   CSN: [id number] Arrival date & time: 02/08/24  9187     Patient presents with: Abdominal Pain   Steve Anderson is a 63 y.o. male.   HPI 63 year old male complains of abdominal pain and 3 episodes of loose stool that began 3 days ago.  He denies fever or chills and has had no known sick contacts.  He has not had nausea and vomiting and denies any change in his appetite.  He has not noted any blood or dark stools.  He denies any headache, chest pain, dyspnea, UTI symptoms.  He has not had similar symptoms in the past.    Prior to Admission medications   Medication Sig Start Date End Date Taking? Authorizing Provider  acetaminophen  (TYLENOL ) 500 MG tablet Take 1,000 mg by mouth every 6 (six) hours as needed for mild pain or headache.    [provider]  amoxicillin  (AMOXIL ) 500 MG capsule Take 1 capsule (500 mg total) by mouth 3 (three) times daily. 02/04/24   Sofia, Leslie K, PA-C  Camphor-Menthol-Methyl Sal (SALONPAS ) 3.06-24-08 % PTCH Apply 1 patch topically in the morning and at bedtime. Patient not taking: Reported on 06/08/2023 05/30/23   Garrick Charleston, MD  HYDROcodone -acetaminophen  (NORCO/VICODIN) 5-325 MG tablet Take 2 tablets by mouth every 4 (four) hours as needed. Patient not taking: Reported on 06/08/2023 05/30/23   Garrick Charleston, MD  ibuprofen  (ADVIL ) 600 MG tablet Take 1 tablet (600 mg total) by mouth every 6 (six) hours as needed. Patient not taking: Reported on 06/08/2023 05/26/23   Dean Clarity, MD  lidocaine  (LIDODERM ) 5 % Place 1 patch onto the skin daily. Remove & Discard patch within 12 hours or as directed by MD 06/08/23   Randol Simmonds, MD  methocarbamol  (ROBAXIN ) 500 MG tablet Take 1 tablet (500 mg total) by mouth 2 (two) times daily as needed. Patient not taking: Reported on 06/08/2023 05/26/23   Dean Clarity, MD  oxyCODONE -acetaminophen  (PERCOCET/ROXICET) 5-325 MG tablet Take 1  tablet by mouth every 6 (six) hours as needed for severe pain (pain score 7-10). 06/08/23   Randol Simmonds, MD  polyethylene glycol (MIRALAX ) 17 g packet Take 17 g by mouth daily. 06/08/23   Randol Simmonds, MD    Allergies: Patient has no known allergies.    Review of Systems  Updated Vital Signs BP 137/82   Pulse (!) 53   Temp 97.6 F (36.4 C) (Oral)   Resp 16   SpO2 98%   Physical Exam Vitals and nursing note reviewed.  Constitutional:      Appearance: He is well-developed.  HENT:     Head: Normocephalic and atraumatic.     Right Ear: External ear normal.     Left Ear: External ear normal.     Nose: Nose normal.  Eyes:     Extraocular Movements: Extraocular movements intact.  Neck:     Trachea: No tracheal deviation.  Pulmonary:     Effort: Pulmonary effort is normal.  Abdominal:     General: Abdomen is flat.     Palpations: Abdomen is soft.     Tenderness: There is generalized abdominal tenderness.  Musculoskeletal:        General: Normal range of motion.  Skin:    General: Skin is warm and dry.  Neurological:     Mental Status: He is alert and oriented to person, place, and time.  Psychiatric:        Mood  and Affect: Mood normal.        Behavior: Behavior normal.     (all labs ordered are listed, but only abnormal results are displayed) Labs Reviewed  CBC - Abnormal; Notable for the following components:      Result Value   Platelets 129 (*)    All other components within normal limits  COMPREHENSIVE METABOLIC PANEL WITH GFR - Abnormal; Notable for the following components:   Calcium 8.8 (*)    Total Protein 6.0 (*)    Albumin 3.3 (*)    All other components within normal limits  LIPASE, BLOOD - Abnormal; Notable for the following components:   Lipase 54 (*)    All other components within normal limits  URINALYSIS, ROUTINE W REFLEX MICROSCOPIC    EKG: None  Radiology: CT ABDOMEN PELVIS W CONTRAST Result Date: 02/08/2024 CLINICAL DATA:  Acute  generalized abdominal pain. EXAM: CT ABDOMEN AND PELVIS WITH CONTRAST TECHNIQUE: Multidetector CT imaging of the abdomen and pelvis was performed using the standard protocol following bolus administration of intravenous contrast. RADIATION DOSE REDUCTION: This exam was performed according to the departmental dose-optimization program which includes automated exposure control, adjustment of the mA and/or kV according to patient size and/or use of iterative reconstruction technique. CONTRAST:  75mL OMNIPAQUE  IOHEXOL  350 MG/ML SOLN COMPARISON:  May 30, 2023. FINDINGS: Lower chest: Mild bibasilar subsegmental atelectasis or scarring is noted. Hepatobiliary: No focal liver abnormality is seen. No gallstones, gallbladder wall thickening, or biliary dilatation. Pancreas: Unremarkable. No pancreatic ductal dilatation or surrounding inflammatory changes. Spleen: Normal in size without focal abnormality. Adrenals/Urinary Tract: Adrenal glands are unremarkable. Kidneys are normal, without renal calculi, focal lesion, or hydronephrosis. Bladder is unremarkable. Stomach/Bowel: Stomach is within normal limits. Appendix appears normal. No evidence of bowel wall thickening, distention, or inflammatory changes. Vascular/Lymphatic: Aortic atherosclerosis. No enlarged abdominal or pelvic lymph nodes. Reproductive: Prostate is unremarkable. Other: No abdominal wall hernia or abnormality. No abdominopelvic ascites. Musculoskeletal: Minimal grade 1 anterolisthesis is noted secondary to bilateral L5 spondylolysis. Old L3 fracture is noted. IMPRESSION: 1. No acute abnormality seen in the abdomen or pelvis. 2. Aortic atherosclerosis. Aortic Atherosclerosis (ICD10-I70.0). Electronically Signed   By: Lynwood Landy Raddle M.D.   On: 02/08/2024 11:38     Procedures   Medications Ordered in the ED  lactated ringers  bolus 500 mL (0 mLs Intravenous Stopped 02/08/24 1015)  fentaNYL  (SUBLIMAZE ) injection 25 mcg (25 mcg Intravenous Given  02/08/24 0927)  iohexol  (OMNIPAQUE ) 350 MG/ML injection 75 mL (75 mLs Intravenous Contrast Given 02/08/24 1106)    Clinical Course as of 02/08/24 1214  Fri Feb 08, 2024  1039 CBC is reviewed and interpreted significant for thrombocytopenia with platelets 129,000 [DR]  1040 Lipase slightly elevated at 54 Complete metabolic panel reviewed interpreted normal sodium potassium chloride  CO2 BUN/creatinine with calcium decreased at 8.8, total protein, albumin decreased [DR]  1156 No acute abnormality seen in abdomen or pelvis but aortic atherosclerosis noted [DR]    Clinical Course User Index [DR] Levander Houston, MD                                 Medical Decision Making Amount and/or Complexity of Data Reviewed Labs: ordered. Radiology: ordered.  Risk Prescription drug management.   63 year old male evaluated today for abdominal pain.  Differential diagnosis is broad and includes but is not limited to diverticulitis, pancreatitis, cholecystitis, appendicitis, other colitis, UTI, small bowel obstruction, prostatitis Patient  was evaluated here in the ED with labs including CBC and c-Met.  Does have thrombocytopenia noted on his CBC likely secondary and related to alcohol use although other etiologies cannot be ruled out.  Plan referral for follow-up. Complete metabolic panel with some mild hypocalcemia and decreased protein albumin likely secondary to poor nutrition. Lipase elevated at 54 CT is obtained and shows no evidence of acute abnormalities Urinalysis is clear No definitive obvious source of pain especially crampy lower abdominal pain.  However he does have some tenderness to the epigastrium which could represent some mild pancreatitis.  Patient has been tolerating p.o. well.  He is advised regarding avoiding avoiding alcohol and fatty foods Patient will follow up with primary care. We discussed return precautions and need for follow-up.     Final diagnoses:  Generalized abdominal  pain  Thrombocytopenia [facility])    ED Discharge Orders     None          Levander Houston, MD 02/08/24 1214

## 2024-02-08 NOTE — ED Notes (Signed)
 Pt returned from CT

## 2024-02-08 NOTE — ED Triage Notes (Signed)
 Pt c.o lower abd cramping since yesterday and 1 episode of diarrhea. Denies n/v

## 2024-03-06 ENCOUNTER — Emergency Department (HOSPITAL_COMMUNITY)

## 2024-03-06 ENCOUNTER — Other Ambulatory Visit: Payer: Self-pay

## 2024-03-06 ENCOUNTER — Emergency Department (HOSPITAL_COMMUNITY)
Admission: EM | Admit: 2024-03-06 | Discharge: 2024-03-06 | Disposition: A | Discharge status: Home / Self Care | Attending: Emergency Medicine | Admitting: Emergency Medicine

## 2024-03-06 DIAGNOSIS — S20211A Contusion of right front wall of thorax, initial encounter: Secondary | ICD-10-CM | POA: Diagnosis not present

## 2024-03-06 DIAGNOSIS — S3992XA Unspecified injury of lower back, initial encounter: Secondary | ICD-10-CM | POA: Diagnosis present

## 2024-03-06 DIAGNOSIS — Y92410 Unspecified street and highway as the place of occurrence of the external cause: Secondary | ICD-10-CM | POA: Diagnosis not present

## 2024-03-06 DIAGNOSIS — S32030A Wedge compression fracture of third lumbar vertebra, initial encounter for closed fracture: Secondary | ICD-10-CM | POA: Diagnosis not present

## 2024-03-06 LAB — CBC WITH DIFFERENTIAL/PLATELET
Abs Immature Granulocytes: 0.01 K/uL (ref 0.00–0.07)
Basophils Absolute: 0 K/uL (ref 0.0–0.1)
Basophils Relative: 1 %
Eosinophils Absolute: 0 K/uL (ref 0.0–0.5)
Eosinophils Relative: 0 %
HCT: 43.5 % (ref 39.0–52.0)
Hemoglobin: 15.3 g/dL (ref 13.0–17.0)
Immature Granulocytes: 0 %
Lymphocytes Relative: 36 %
Lymphs Abs: 1.7 K/uL (ref 0.7–4.0)
MCH: 32.5 pg (ref 26.0–34.0)
MCHC: 35.2 g/dL (ref 30.0–36.0)
MCV: 92.4 fL (ref 80.0–100.0)
Monocytes Absolute: 0.4 K/uL (ref 0.1–1.0)
Monocytes Relative: 9 %
Neutro Abs: 2.6 K/uL (ref 1.7–7.7)
Neutrophils Relative %: 54 %
Platelets: 143 K/uL — ABNORMAL LOW (ref 150–400)
RBC: 4.71 MIL/uL (ref 4.22–5.81)
RDW: 13.3 % (ref 11.5–15.5)
WBC: 4.8 K/uL (ref 4.0–10.5)
nRBC: 0 % (ref 0.0–0.2)

## 2024-03-06 LAB — COMPREHENSIVE METABOLIC PANEL WITH GFR
ALT: 22 U/L (ref 0–44)
AST: 28 U/L (ref 15–41)
Albumin: 3.7 g/dL (ref 3.5–5.0)
Alkaline Phosphatase: 54 U/L (ref 38–126)
Anion gap: 13 (ref 5–15)
BUN: 9 mg/dL (ref 8–23)
CO2: 23 mmol/L (ref 22–32)
Calcium: 9.2 mg/dL (ref 8.9–10.3)
Chloride: 102 mmol/L (ref 98–111)
Creatinine, Ser: 0.98 mg/dL (ref 0.61–1.24)
GFR, Estimated: >60 mL/min (ref >60)
Glucose, Bld: 180 mg/dL — ABNORMAL HIGH (ref 70–99)
Potassium: 3.5 mmol/L (ref 3.5–5.1)
Sodium: 138 mmol/L (ref 135–145)
Total Bilirubin: 1.5 mg/dL — ABNORMAL HIGH (ref 0.0–1.2)
Total Protein: 7.1 g/dL (ref 6.5–8.1)

## 2024-03-06 LAB — LIPASE, BLOOD: Lipase: 50 U/L (ref 11–51)

## 2024-03-06 MED ORDER — ACETAMINOPHEN 325 MG PO TABS
650.0000 mg | ORAL_TABLET | Freq: Once | ORAL | Status: AC
  Administered 2024-03-06: 650 mg via ORAL
  Filled 2024-03-06: qty 2

## 2024-03-06 MED ORDER — KETOROLAC TROMETHAMINE 15 MG/ML IJ SOLN
15.0000 mg | Freq: Once | INTRAMUSCULAR | Status: AC
  Administered 2024-03-06: 15 mg via INTRAVENOUS
  Filled 2024-03-06: qty 1

## 2024-03-06 MED ORDER — MORPHINE SULFATE (PF) 4 MG/ML IV SOLN
4.0000 mg | Freq: Once | INTRAVENOUS | Status: AC
  Administered 2024-03-06: 4 mg via INTRAVENOUS
  Filled 2024-03-06: qty 1

## 2024-03-06 MED ORDER — NAPROXEN 500 MG PO TABS: 500.0000 mg | ORAL_TABLET | Freq: Two times a day (BID) | ORAL | 0 refills | Status: DC

## 2024-03-06 MED ORDER — OXYCODONE-ACETAMINOPHEN 5-325 MG PO TABS: 1.0000 | ORAL_TABLET | Freq: Four times a day (QID) | ORAL | 0 refills | Status: DC | PRN

## 2024-03-06 MED ORDER — IOHEXOL 350 MG/ML SOLN
75.0000 mL | Freq: Once | INTRAVENOUS | Status: AC | PRN
Start: 2024-03-06 — End: 2024-03-06
  Administered 2024-03-06: 75 mL via INTRAVENOUS

## 2024-03-06 MED ORDER — ONDANSETRON HCL 4 MG/2ML IJ SOLN
4.0000 mg | Freq: Once | INTRAMUSCULAR | Status: AC
  Administered 2024-03-06: 4 mg via INTRAVENOUS
  Filled 2024-03-06: qty 2

## 2024-03-06 NOTE — Progress Notes (Addendum)
 Orthopedic Tech Progress Note Patient Details:  PIETER FOOKS 04/15/1961 [id number]   Patient was provided with TLSO and discharged with [employer] product. Ortho Devices Type of Ortho Device: Thoracolumbar corset (TLSO) Ortho Device/Splint Location: T-SPINE Ortho Device/Splint Interventions: Ordered, Application, Adjustment   Post Interventions Patient Tolerated: Well Instructions Provided: Care of device   Abdul Beirne L Georgenia Salim 03/06/2024, 10:30 PM

## 2024-03-06 NOTE — ED Notes (Signed)
 PA at [facility].
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
## 2024-03-06 NOTE — Discharge Instructions (Signed)
 Please follow-up closely with neurosurgery on an outpatient basis.  Please call to make an appointment.  Please continue to wear the back brace until evaluated by neurosurgery.  Return to the emergency department immediately for any new or worsening symptoms.
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
## 2024-04-08 ENCOUNTER — Ambulatory Visit (HOSPITAL_COMMUNITY)
Admission: EM | Admit: 2024-04-08 | Discharge: 2024-04-08 | Disposition: A | Discharge status: Home / Self Care | Attending: Emergency Medicine | Admitting: Emergency Medicine

## 2024-04-08 ENCOUNTER — Encounter (HOSPITAL_COMMUNITY): Payer: Self-pay

## 2024-04-08 DIAGNOSIS — K0889 Other specified disorders of teeth and supporting structures: Secondary | ICD-10-CM | POA: Diagnosis not present

## 2024-04-08 DIAGNOSIS — K047 Periapical abscess without sinus: Secondary | ICD-10-CM | POA: Diagnosis not present

## 2024-04-08 MED ORDER — KETOROLAC TROMETHAMINE 10 MG PO TABS: 10.0000 mg | ORAL_TABLET | Freq: Four times a day (QID) | ORAL | 0 refills | Status: AC | PRN

## 2024-04-08 MED ORDER — AMOXICILLIN-POT CLAVULANATE 875-125 MG PO TABS: 1.0000 | ORAL_TABLET | Freq: Two times a day (BID) | ORAL | 0 refills | Status: AC

## 2024-04-08 MED ORDER — KETOROLAC TROMETHAMINE 30 MG/ML IJ SOLN
INTRAMUSCULAR | Status: AC
  Filled 2024-04-08: qty 1

## 2024-04-08 MED ORDER — KETOROLAC TROMETHAMINE 30 MG/ML IJ SOLN
30.0000 mg | Freq: Once | INTRAMUSCULAR | Status: AC
  Administered 2024-04-08: 30 mg via INTRAMUSCULAR

## 2024-04-08 NOTE — ED Triage Notes (Signed)
 Pt c/o lt upper toothache with swelling x2 days. Took advil  with no relief.

## 2024-04-08 NOTE — Discharge Instructions (Addendum)
 Start taking Augmentin  twice daily for 10 days for dental abscess coverage We have given you an injection of Toradol  in clinic today to help with your pain.  Do not take any additional Toradol  for at least 8 hours after receiving this. After this you can take 1 tablet of Toradol  every 6 hours as needed for pain.  Do not take this with ibuprofen , naproxen , or BC powder. You can take 500 to 1000 mg of Tylenol  every 6-8 hours as needed for breakthrough pain. I have attached dental resources to follow-up with for further evaluation of your dental abscess and concerns.  Schedule an appointment as soon as possible with a dentist to be evaluated for this.

## 2024-04-08 NOTE — ED Provider Notes (Signed)
 [facility]    CSN: [id number] Arrival date & time: 04/08/24  1018      History   Chief Complaint Chief Complaint  Patient presents with   Dental Pain    HPI Steve Anderson is a 63 y.o. male.   Patient present with dental pain just above his left front tooth that began 2 days ago.  Patient states he has also noticed some swelling to this area and is concerned about an abscess.  Patient states that he did take Advil  yesterday without relief.  Patient denies having a dentist at this time.  Patient states that he has seen a dentist before and has been told that he needs 8 of his teeth pulled but he has not yet done this.  The history is provided by the patient and medical records.  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 Authorizing Provider  amoxicillin -clavulanate (AUGMENTIN ) 875-125 MG tablet Take 1 tablet by mouth every 12 (twelve) hours for 10 days. 04/08/24 04/18/24 Yes Johnie, Aina Rossbach A, NP  ketorolac  (TORADOL ) 10 MG tablet Take 1 tablet (10 mg total) by mouth every 6 (six) hours as needed for moderate pain (pain score 4-6) or severe pain (pain score 7-10). 04/08/24  Yes Johnie, Kanon Colunga A, NP  acetaminophen  (TYLENOL ) 500 MG tablet Take 1,000 mg by mouth every 6 (six) hours as needed for mild pain or headache.    [provider]  Camphor-Menthol-Methyl Sal (SALONPAS ) 3.06-24-08 % PT[facility] Apply 1 patch topically in the morning and at bedtime. Patient not taking:  Reported on 06/08/2023 05/30/23   Garrick Charleston, MD  methocarbamol  (ROBAXIN ) 500 MG tablet Take 1 tablet (500 mg total) by mouth 2 (two) times daily as needed. Patient not taking: Reported on 06/08/2023 05/26/23   Dean Clarity, MD    Family History Family History  Problem Relation Age of Onset   Hypertension Mother    Brain cancer Mother        not clear if actually malignant tumor   Hypertension Father    Cancer Father        Throat    Social History Social History   Tobacco Use   Smoking status: Every Day    Current packs/day: 0.50    Types: Cigarettes   Smokeless tobacco: Never  Vaping Use   Vaping status: Never Used  Substance Use Topics   Alcohol use: Not Currently    Alcohol/week: 28.0 standard drinks of alcohol    Types: 28 Cans of beer per week    Comment: daily   Drug use: Not Currently     Allergies   Patient has no known allergies.   Review of Systems Review of Systems  Per HPI  Physical Exam Triage Vital Signs ED Triage Vitals  Encounter Vitals Group     BP 04/08/24 1118 (!) 144/85     Girls Systolic BP Percentile --  Girls Diastolic BP Percentile --      Boys Systolic BP Percentile --      Boys Diastolic BP Percentile --      Pulse Rate 04/08/24 1118 97     Resp 04/08/24 1118 18     Temp 04/08/24 1118 98.7 F (37.1 C)     Temp Source 04/08/24 1118 Oral     SpO2 04/08/24 1118 96 %     Weight --      Height --      Head Circumference --      Peak Flow --      Pain Score 04/08/24 1117 10     Pain Loc --      Pain Education --      Exclude from Growth Chart --    No data found.  Updated Vital Signs BP (!) 144/85 (BP Location: Right Arm)   Pulse 97   Temp 98.7 F (37.1 C) (Oral)   Resp 18   SpO2 96%   Visual Acuity Right Eye Distance:   Left Eye Distance:   Bilateral Distance:    Right Eye Near:   Left Eye Near:    Bilateral Near:     Physical Exam Vitals and nursing note reviewed.  Constitutional:       General: He is awake. He is not in acute distress.    Appearance: Normal appearance. He is well-developed and well-groomed. He is not ill-appearing.  HENT:     Mouth/Throat:     Dentition: Abnormal dentition. Dental tenderness, gingival swelling and dental abscesses present.     Comments: Dental abscess noted just above left front tooth with surrounding gingival swelling and tenderness.  Abscess is not actively draining at this time. Neurological:     Mental Status: He is alert.  Psychiatric:        Behavior: Behavior is cooperative.      UC Treatments / Results  Labs (all labs ordered are listed, but only abnormal results are displayed) Labs Reviewed - No data to display  EKG   Radiology No results found.  Procedures Procedures (including critical care time)  Medications Ordered in UC Medications  ketorolac  (TORADOL ) 30 MG/ML injection 30 mg (has no administration in time range)    Initial Impression / Assessment and Plan / UC Course  I have reviewed the triage vital signs and the nursing notes.  Pertinent labs & imaging results that were available during my care of the patient were reviewed by me and considered in my medical decision making (see chart for details).     Patient is overall well-appearing.  Vitals are stable.  Dental abscess noted on exam.  Latest GFR was greater than 60 on 03/06/2024.  Given IM Toradol  in clinic for acute pain.  Prescribed additional Toradol  as needed for pain.  Prescribed Augmentin  for dental abscess coverage.  Given a list of dental resources for further evaluation.  Discussed follow-up and return precautions.  Final Clinical Impressions(s) / UC Diagnoses   Final diagnoses:  Dental abscess  Pain, dental     Discharge Instructions      Start taking Augmentin  twice daily for 10 days for dental abscess coverage We have given you an injection of Toradol  in clinic today to help with your pain.  Do not take any additional Toradol  for  at least 8 hours after receiving this. After this you can take 1 tablet of Toradol  every 6 hours as needed for pain.  Do not take this  with ibuprofen , naproxen , or BC powder. You can take 500 to 1000 mg of Tylenol  every 6-8 hours as needed for breakthrough pain. I have attached dental resources to follow-up with for further evaluation of your dental abscess and concerns.  Schedule an appointment as soon as possible with a dentist to be evaluated for this.   ED Prescriptions     Medication Sig Dispense Auth. Provider   amoxicillin -clavulanate (AUGMENTIN ) 875-125 MG tablet Take 1 tablet by mouth every 12 (twelve) hours for 10 days. 20 tablet Johnie Flaming A, NP   ketorolac  (TORADOL ) 10 MG tablet Take 1 tablet (10 mg total) by mouth every 6 (six) hours as needed for moderate pain (pain score 4-6) or severe pain (pain score 7-10). 20 tablet Johnie Flaming A, NP      PDMP not reviewed this encounter.   Johnie Flaming A, NP 04/08/24 1128

## 2024-11-24 ENCOUNTER — Ambulatory Visit: Admitting: Internal Medicine
